# Patient Record
Sex: Male | Born: 1960 | Race: White | Hispanic: No | Marital: Single | Smoking: Current every day smoker
Health system: Southern US, Community
[De-identification: ages and names within clinical notes are randomized; demographics above are authoritative.]

## PROBLEM LIST (undated history)

## (undated) ENCOUNTER — Inpatient Hospital Stay: Payer: Self-pay | Admitting: Pulmonary Disease

## (undated) DIAGNOSIS — R569 Unspecified convulsions: Secondary | ICD-10-CM

## (undated) DIAGNOSIS — I472 Ventricular tachycardia, unspecified: Secondary | ICD-10-CM

## (undated) DIAGNOSIS — Z5189 Encounter for other specified aftercare: Secondary | ICD-10-CM

## (undated) DIAGNOSIS — F1911 Other psychoactive substance abuse, in remission: Secondary | ICD-10-CM

## (undated) DIAGNOSIS — J45909 Unspecified asthma, uncomplicated: Secondary | ICD-10-CM

## (undated) DIAGNOSIS — R634 Abnormal weight loss: Secondary | ICD-10-CM

## (undated) DIAGNOSIS — R51 Headache: Secondary | ICD-10-CM

## (undated) DIAGNOSIS — G43909 Migraine, unspecified, not intractable, without status migrainosus: Secondary | ICD-10-CM

## (undated) DIAGNOSIS — R011 Cardiac murmur, unspecified: Secondary | ICD-10-CM

## (undated) DIAGNOSIS — K922 Gastrointestinal hemorrhage, unspecified: Secondary | ICD-10-CM

## (undated) DIAGNOSIS — R0602 Shortness of breath: Secondary | ICD-10-CM

## (undated) DIAGNOSIS — IMO0001 Reserved for inherently not codable concepts without codable children: Secondary | ICD-10-CM

## (undated) DIAGNOSIS — F17210 Nicotine dependence, cigarettes, uncomplicated: Secondary | ICD-10-CM

## (undated) DIAGNOSIS — R197 Diarrhea, unspecified: Secondary | ICD-10-CM

## (undated) DIAGNOSIS — F32A Depression, unspecified: Secondary | ICD-10-CM

## (undated) DIAGNOSIS — K635 Polyp of colon: Secondary | ICD-10-CM

## (undated) DIAGNOSIS — F329 Major depressive disorder, single episode, unspecified: Secondary | ICD-10-CM

## (undated) DIAGNOSIS — B1920 Unspecified viral hepatitis C without hepatic coma: Secondary | ICD-10-CM

## (undated) DIAGNOSIS — N289 Disorder of kidney and ureter, unspecified: Secondary | ICD-10-CM

## (undated) DIAGNOSIS — Z992 Dependence on renal dialysis: Secondary | ICD-10-CM

## (undated) DIAGNOSIS — K5790 Diverticulosis of intestine, part unspecified, without perforation or abscess without bleeding: Secondary | ICD-10-CM

## (undated) DIAGNOSIS — Z8719 Personal history of other diseases of the digestive system: Secondary | ICD-10-CM

## (undated) DIAGNOSIS — J329 Chronic sinusitis, unspecified: Secondary | ICD-10-CM

## (undated) DIAGNOSIS — K219 Gastro-esophageal reflux disease without esophagitis: Secondary | ICD-10-CM

## (undated) DIAGNOSIS — E039 Hypothyroidism, unspecified: Secondary | ICD-10-CM

## (undated) DIAGNOSIS — E7800 Pure hypercholesterolemia, unspecified: Secondary | ICD-10-CM

## (undated) DIAGNOSIS — I4729 Other ventricular tachycardia: Secondary | ICD-10-CM

## (undated) DIAGNOSIS — I509 Heart failure, unspecified: Secondary | ICD-10-CM

## (undated) DIAGNOSIS — J449 Chronic obstructive pulmonary disease, unspecified: Secondary | ICD-10-CM

## (undated) DIAGNOSIS — N19 Unspecified kidney failure: Secondary | ICD-10-CM

## (undated) DIAGNOSIS — D649 Anemia, unspecified: Secondary | ICD-10-CM

## (undated) DIAGNOSIS — F419 Anxiety disorder, unspecified: Secondary | ICD-10-CM

## (undated) DIAGNOSIS — I10 Essential (primary) hypertension: Secondary | ICD-10-CM

## (undated) HISTORY — DX: Gastro-esophageal reflux disease without esophagitis: K21.9

## (undated) HISTORY — DX: Polyp of colon: K63.5

## (undated) HISTORY — DX: Other ventricular tachycardia: I47.29

## (undated) HISTORY — DX: Depression, unspecified: F32.A

## (undated) HISTORY — DX: Nicotine dependence, cigarettes, uncomplicated: F17.210

## (undated) HISTORY — DX: Unspecified porphyria: E80.20

## (undated) HISTORY — DX: Ventricular tachycardia, unspecified: I47.20

## (undated) HISTORY — DX: Anxiety disorder, unspecified: F41.9

## (undated) HISTORY — DX: Unspecified asthma, uncomplicated: J45.909

## (undated) HISTORY — DX: Headache: R51

## (undated) HISTORY — DX: Essential (primary) hypertension: I10

## (undated) HISTORY — DX: Hypothyroidism, unspecified: E03.9

## (undated) HISTORY — DX: Pure hypercholesterolemia, unspecified: E78.00

## (undated) HISTORY — PX: BRAIN SURGERY: SHX531

## (undated) HISTORY — DX: Diverticulosis of intestine, part unspecified, without perforation or abscess without bleeding: K57.90

## (undated) HISTORY — DX: Ventricular tachycardia: I47.2

## (undated) HISTORY — DX: Major depressive disorder, single episode, unspecified: F32.9

## (undated) HISTORY — DX: Abnormal weight loss: R63.4

## (undated) HISTORY — DX: Chronic sinusitis, unspecified: J32.9

## (undated) HISTORY — DX: Diarrhea, unspecified: R19.7

## (undated) HISTORY — PX: HIATAL HERNIA REPAIR: SHX195

## (undated) HISTORY — DX: Other psychoactive substance abuse, in remission: F19.11

## (undated) HISTORY — DX: Unspecified kidney failure: N19

## (undated) HISTORY — DX: Anemia, unspecified: D64.9

## (undated) HISTORY — DX: Unspecified viral hepatitis C without hepatic coma: B19.20

---
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
## 1997-11-02 HISTORY — PX: NISSEN FUNDOPLICATION: SHX2091

## 2005-12-08 ENCOUNTER — Ambulatory Visit: Payer: Self-pay | Admitting: Pulmonary Disease

## 2005-12-21 ENCOUNTER — Ambulatory Visit: Payer: Self-pay | Admitting: Pulmonary Disease

## 2005-12-22 ENCOUNTER — Ambulatory Visit: Payer: Self-pay | Admitting: Cardiovascular Disease

## 2006-02-01 ENCOUNTER — Ambulatory Visit: Payer: Self-pay | Admitting: Pulmonary Disease

## 2006-02-16 ENCOUNTER — Ambulatory Visit: Payer: Self-pay | Admitting: Gastroenterology

## 2006-02-17 ENCOUNTER — Ambulatory Visit: Payer: Self-pay | Admitting: Internal Medicine

## 2006-05-17 ENCOUNTER — Ambulatory Visit: Payer: Self-pay | Admitting: Pulmonary Disease

## 2006-09-13 ENCOUNTER — Ambulatory Visit: Payer: Self-pay | Admitting: Pulmonary Disease

## 2007-05-02 ENCOUNTER — Ambulatory Visit: Payer: Self-pay | Admitting: Pulmonary Disease

## 2007-05-02 LAB — CONVERTED CEMR LAB
ALT: 28 units/L (ref 0–53)
Albumin: 2.2 g/dL — ABNORMAL LOW (ref 3.5–5.2)
Alkaline Phosphatase: 98 units/L (ref 39–117)
BUN: 9 mg/dL (ref 6–23)
Basophils Absolute: 0.1 10*3/uL (ref 0.0–0.1)
Basophils Relative: 1 % (ref 0.0–1.0)
CO2: 27 meq/L (ref 19–32)
Calcium: 8.5 mg/dL (ref 8.4–10.5)
Creatinine, Ser: 1 mg/dL (ref 0.4–1.5)
MCHC: 35.1 g/dL (ref 30.0–36.0)
Monocytes Relative: 11.2 % — ABNORMAL HIGH (ref 3.0–11.0)
Platelets: 397 10*3/uL (ref 150–400)
Potassium: 4 meq/L (ref 3.5–5.1)
RBC: 4.24 M/uL (ref 4.22–5.81)
RDW: 12.6 % (ref 11.5–14.6)
TSH: 7.6 microintl units/mL — ABNORMAL HIGH (ref 0.35–5.50)
Total Bilirubin: 0.6 mg/dL (ref 0.3–1.2)
Total Protein: 5.2 g/dL — ABNORMAL LOW (ref 6.0–8.3)

## 2007-05-19 ENCOUNTER — Ambulatory Visit: Payer: Self-pay | Admitting: Gastroenterology

## 2007-05-19 LAB — CONVERTED CEMR LAB
Ammonia: 42 umol/L — ABNORMAL HIGH (ref 11–35)
HCV Quantitative: 9970000 intl units/mL — ABNORMAL HIGH (ref <5)
INR: 0.8 — ABNORMAL LOW (ref 0.9–2.0)
Iron: 217 ug/dL — ABNORMAL HIGH (ref 42–165)
Saturation Ratios: 54.1 % — ABNORMAL HIGH (ref 20.0–50.0)
Transferrin: 286.3 mg/dL (ref >=212.0)

## 2007-05-23 ENCOUNTER — Ambulatory Visit: Payer: Self-pay | Admitting: Gastroenterology

## 2007-05-30 ENCOUNTER — Ambulatory Visit: Payer: Self-pay | Admitting: Gastroenterology

## 2007-06-14 ENCOUNTER — Ambulatory Visit (HOSPITAL_COMMUNITY): Admission: RE | Admit: 2007-06-14 | Discharge: 2007-06-14 | Payer: Self-pay | Admitting: Gastroenterology

## 2007-06-14 ENCOUNTER — Encounter (INDEPENDENT_AMBULATORY_CARE_PROVIDER_SITE_OTHER): Payer: Self-pay | Admitting: Interventional Radiology

## 2007-06-30 ENCOUNTER — Ambulatory Visit: Payer: Self-pay | Admitting: Gastroenterology

## 2007-07-18 ENCOUNTER — Encounter: Payer: Self-pay | Admitting: Gastroenterology

## 2007-07-18 ENCOUNTER — Ambulatory Visit: Payer: Self-pay | Admitting: Gastroenterology

## 2007-07-18 DIAGNOSIS — D126 Benign neoplasm of colon, unspecified: Secondary | ICD-10-CM

## 2007-07-18 DIAGNOSIS — K5730 Diverticulosis of large intestine without perforation or abscess without bleeding: Secondary | ICD-10-CM | POA: Insufficient documentation

## 2007-09-06 ENCOUNTER — Ambulatory Visit: Payer: Self-pay | Admitting: Pulmonary Disease

## 2007-09-06 DIAGNOSIS — K219 Gastro-esophageal reflux disease without esophagitis: Secondary | ICD-10-CM

## 2007-09-06 DIAGNOSIS — J209 Acute bronchitis, unspecified: Secondary | ICD-10-CM | POA: Insufficient documentation

## 2007-09-06 DIAGNOSIS — F411 Generalized anxiety disorder: Secondary | ICD-10-CM | POA: Insufficient documentation

## 2007-09-12 ENCOUNTER — Ambulatory Visit: Payer: Self-pay | Admitting: Gastroenterology

## 2007-10-12 ENCOUNTER — Telehealth (INDEPENDENT_AMBULATORY_CARE_PROVIDER_SITE_OTHER): Payer: Self-pay | Admitting: *Deleted

## 2007-11-04 ENCOUNTER — Telehealth: Payer: Self-pay | Admitting: Pulmonary Disease

## 2007-11-10 ENCOUNTER — Telehealth (INDEPENDENT_AMBULATORY_CARE_PROVIDER_SITE_OTHER): Payer: Self-pay | Admitting: *Deleted

## 2007-11-11 ENCOUNTER — Ambulatory Visit: Payer: Self-pay | Admitting: Pulmonary Disease

## 2007-11-29 ENCOUNTER — Telehealth (INDEPENDENT_AMBULATORY_CARE_PROVIDER_SITE_OTHER): Payer: Self-pay | Admitting: *Deleted

## 2007-12-13 ENCOUNTER — Ambulatory Visit: Payer: Self-pay | Admitting: Gastroenterology

## 2007-12-15 ENCOUNTER — Ambulatory Visit: Payer: Self-pay | Admitting: Pulmonary Disease

## 2007-12-15 DIAGNOSIS — R569 Unspecified convulsions: Secondary | ICD-10-CM

## 2007-12-15 DIAGNOSIS — F17200 Nicotine dependence, unspecified, uncomplicated: Secondary | ICD-10-CM

## 2007-12-15 DIAGNOSIS — B1710 Acute hepatitis C without hepatic coma: Secondary | ICD-10-CM | POA: Insufficient documentation

## 2007-12-17 DIAGNOSIS — R51 Headache: Secondary | ICD-10-CM | POA: Insufficient documentation

## 2007-12-17 DIAGNOSIS — R519 Headache, unspecified: Secondary | ICD-10-CM | POA: Insufficient documentation

## 2007-12-17 DIAGNOSIS — I10 Essential (primary) hypertension: Secondary | ICD-10-CM | POA: Insufficient documentation

## 2007-12-21 ENCOUNTER — Encounter: Payer: Self-pay | Admitting: Pulmonary Disease

## 2008-01-12 ENCOUNTER — Telehealth (INDEPENDENT_AMBULATORY_CARE_PROVIDER_SITE_OTHER): Payer: Self-pay | Admitting: *Deleted

## 2008-01-16 ENCOUNTER — Ambulatory Visit: Payer: Self-pay | Admitting: Pulmonary Disease

## 2008-01-27 DIAGNOSIS — Z9189 Other specified personal risk factors, not elsewhere classified: Secondary | ICD-10-CM | POA: Insufficient documentation

## 2008-02-23 ENCOUNTER — Encounter: Payer: Self-pay | Admitting: Pulmonary Disease

## 2008-03-07 IMAGING — US US ABDOMEN COMPLETE
1 series · 13 of 25 positions shown · non-contrast
Comparison: none

[Series 1: us abdomen complete · 0.31mm/px · 13 of 47 slices shown]
[im 1/47]
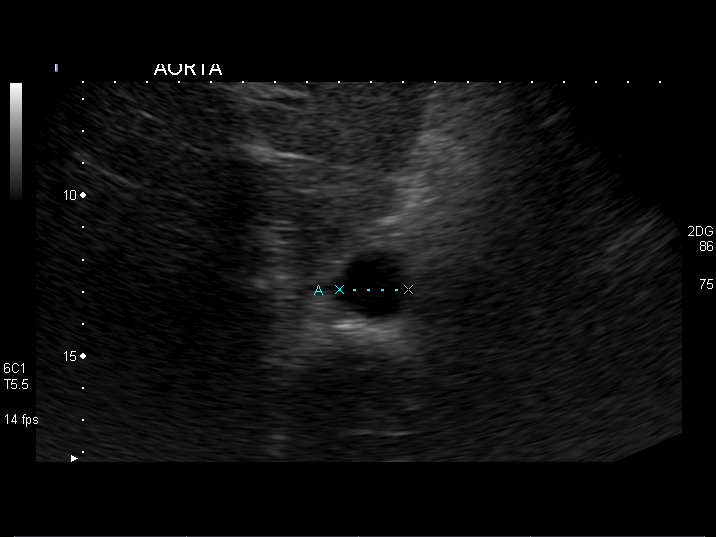
[im 4/47]
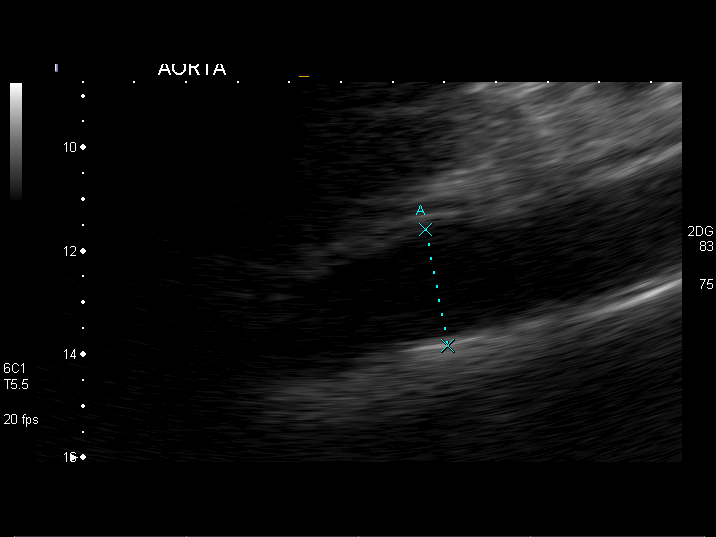
[im 8/47]
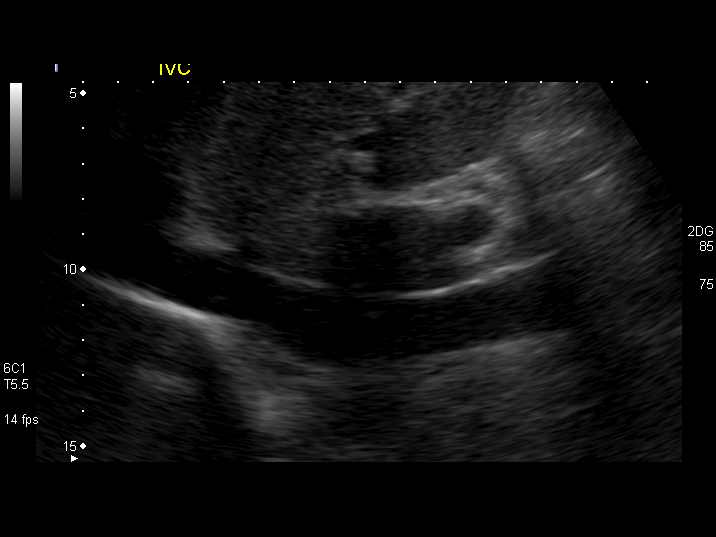
[im 12/47]
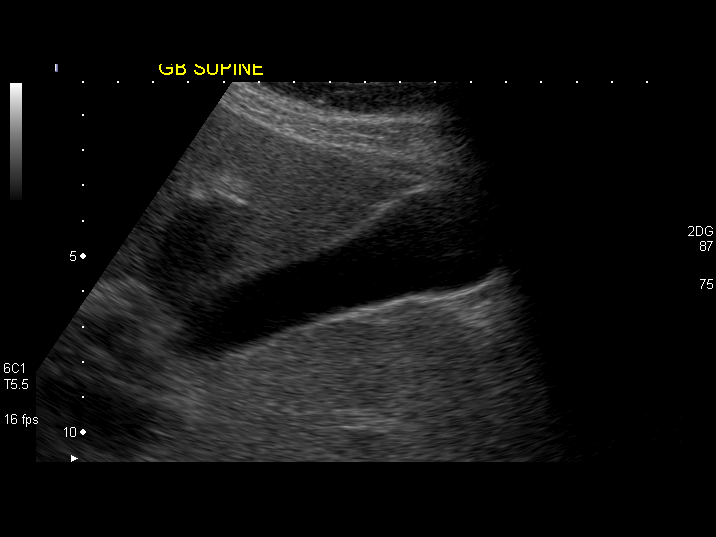
[im 16/47]
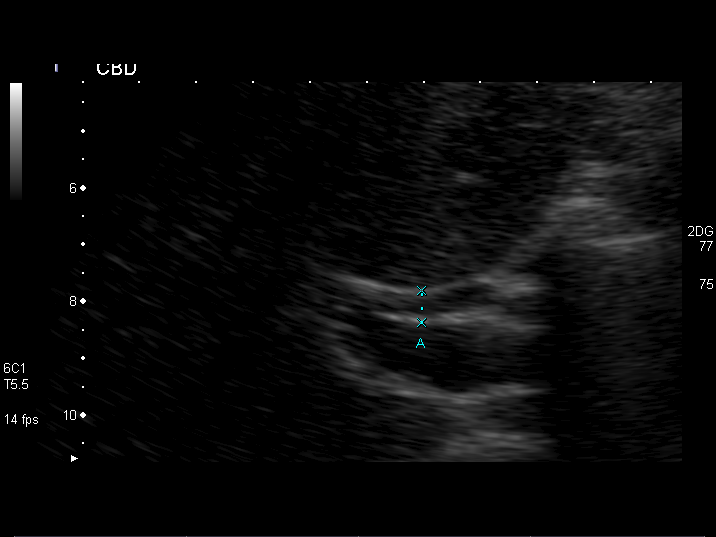
[im 20/47]
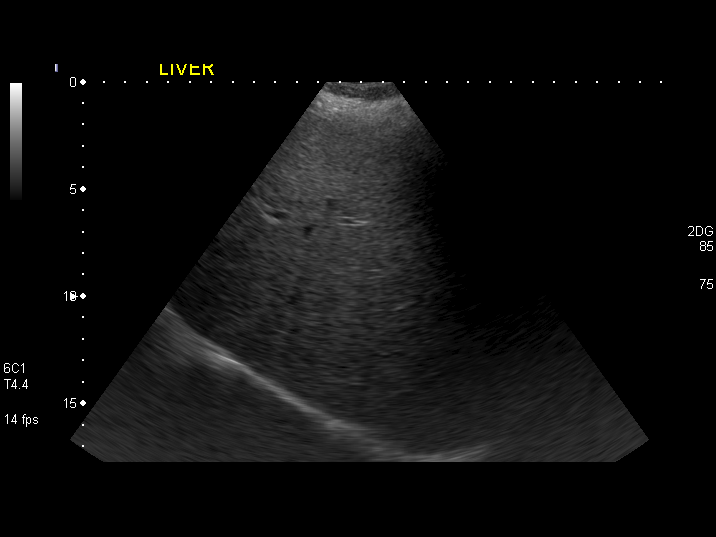
[im 24/47]
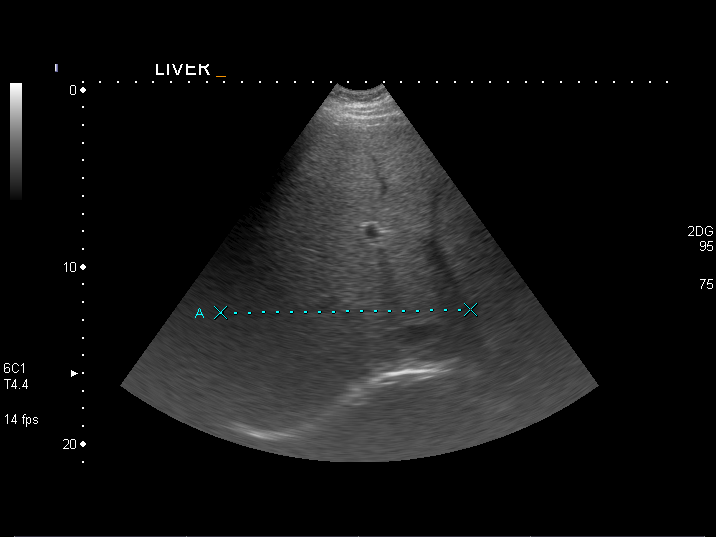
[im 27/47]
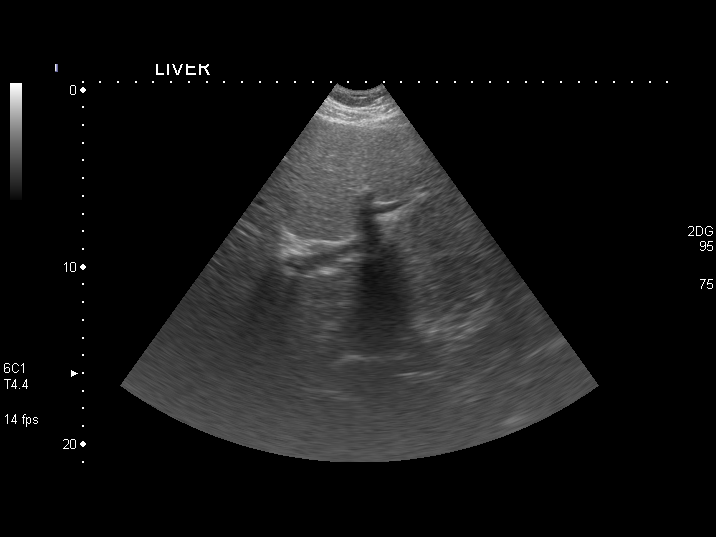
[im 31/47]
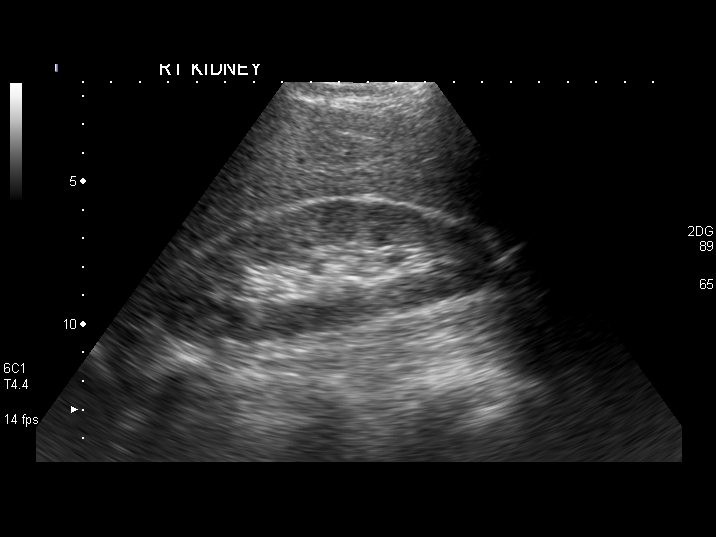
[im 35/47]
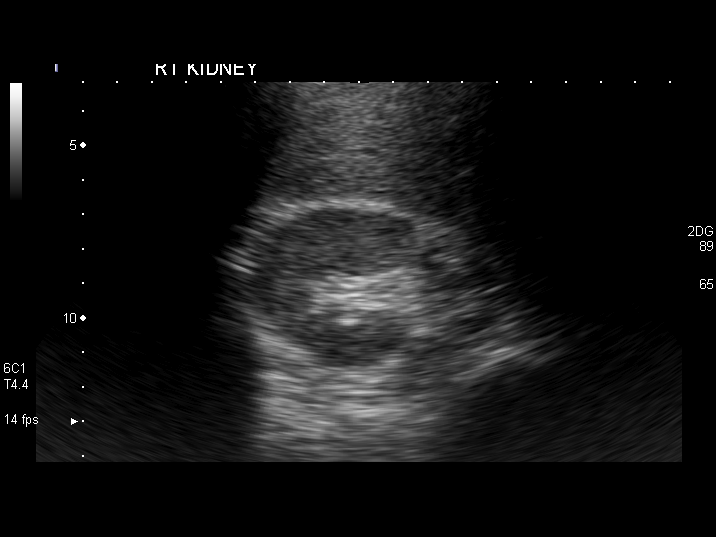
[im 39/47]
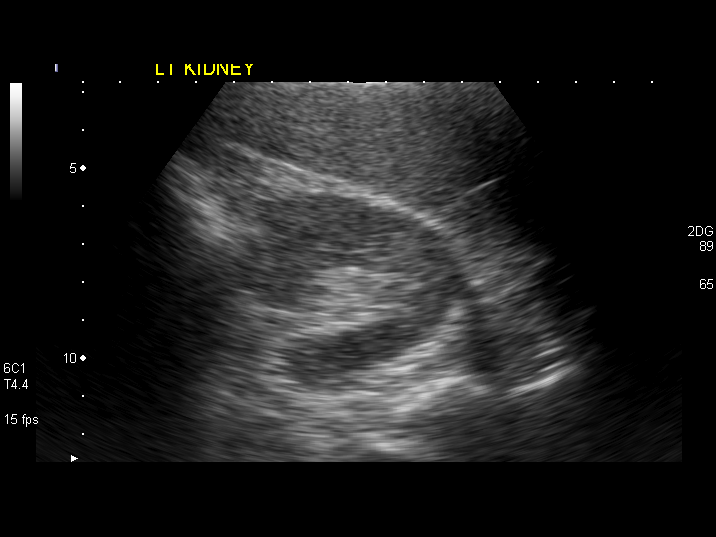
[im 43/47]
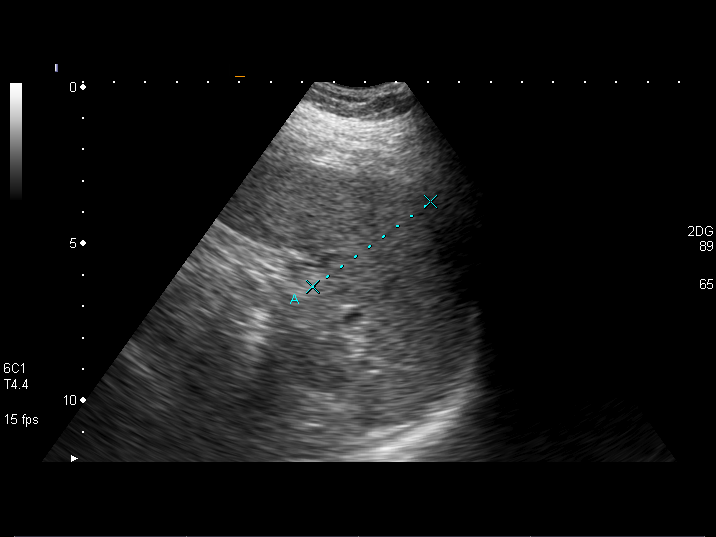
[im 47/47]
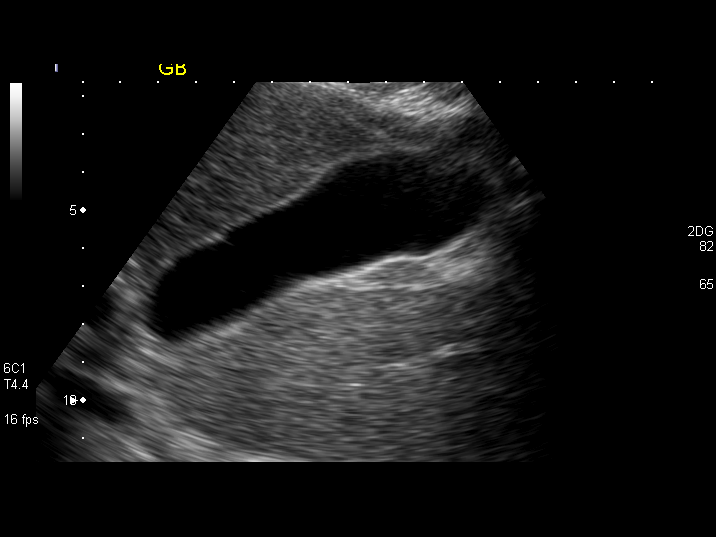

[13 of 25 positions shown; findings below may reference images not displayed]

ACCESSION #:  [id number]

 READING PHYSICIAN:  De Schepper, Mariadosanjos

 PROCEDURE:  Multiplanar abdominal ultrasound imaging was performed in the upright, supine, right and left lateral decubitus positions.

 RESULTS:  Abdominal aorta normal.   Diameter 2.3 cm.  The IVC is patent. 

 The pancreas appears normal throughout the head, body and tail without evidence of ductal dilatation, pancreatic masses, or peripancreatic inflammation.  

 Gallbladder is well distended, thin walled, with no pericholecystic fluid or intraluminal echogenic foci to suggest gallstone disease.  Wall thickness 2.7 mm.  

 The common bile duct measures 5.6 mm in maximal diameter without evidence of intraluminal foci. 

 There is mild increased liver echodensity without focal hepatic mass, lesions, or dilated biliary structures.  There is mild increased echo density; and, also, the liver appears enlarged. 

 Kidneys are normal in appearance; right 11.7 and left 11.4 cm.  

 Spleen is normal in size, measuring 10.6 cm without parenchymal lesion.

 ASSESSMENT:  This is a normal upper abdominal ultrasound exam except for an enlarged echo dense liver consistent with fatty infiltration of the liver.  Gallbladder appears normal without cholelithiasis and the pancreas is well imaged and appears normal.

## 2008-03-15 ENCOUNTER — Ambulatory Visit: Payer: Self-pay | Admitting: Gastroenterology

## 2008-03-29 IMAGING — US US BIOPSY
1 series · 9 of 9 positions shown · non-contrast
Comparison: none

CLINICAL DATA: Hepatitis C; request is made for random core liver biopsy.
ULTRASOUND-GUIDED RANDOM CORE LIVER BIOPSY:

[Series 1: biopsy · 0.46mm/px · 9 of 9 slices shown]
[im 1/9]
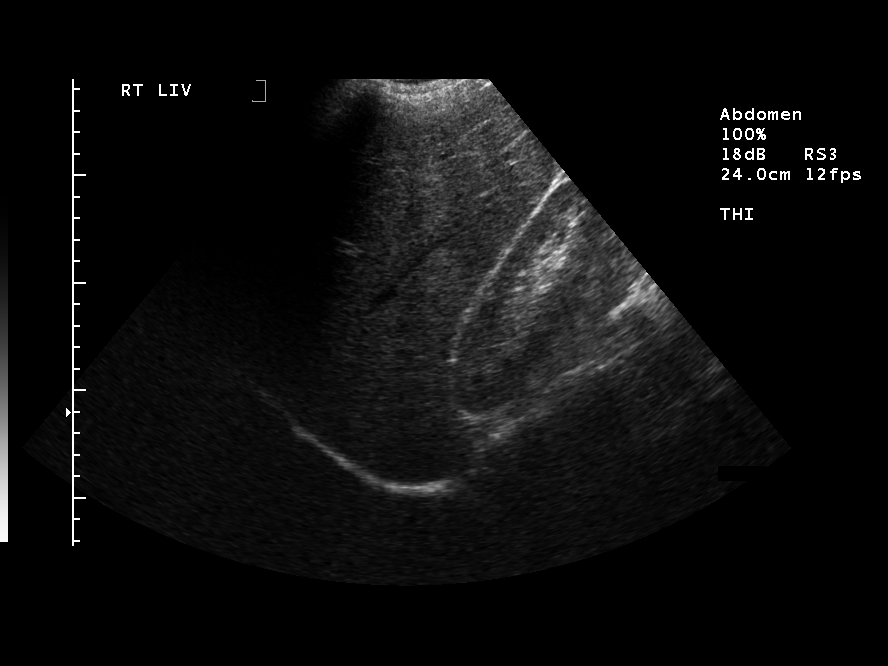
[im 2/9]
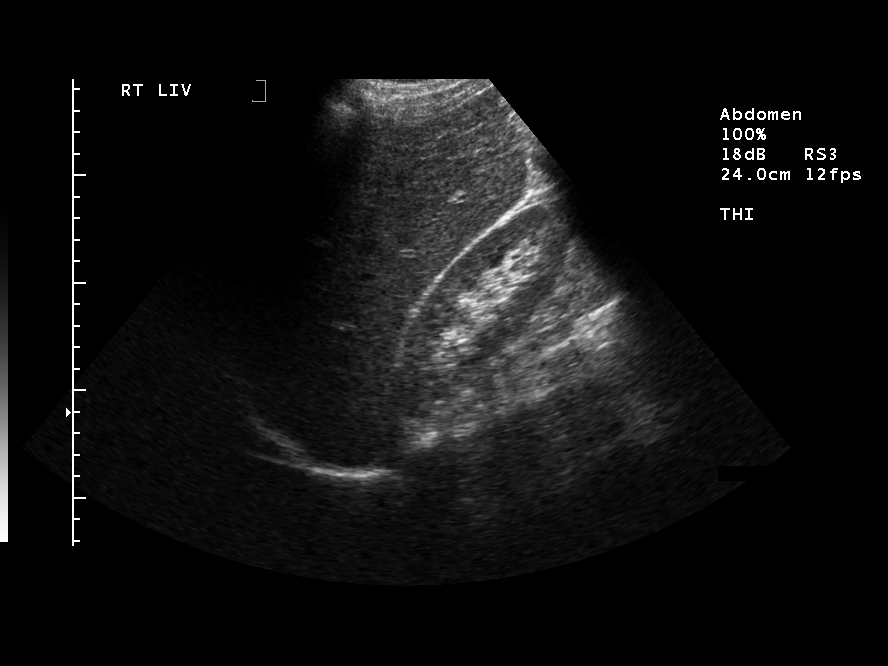
[im 3/9]
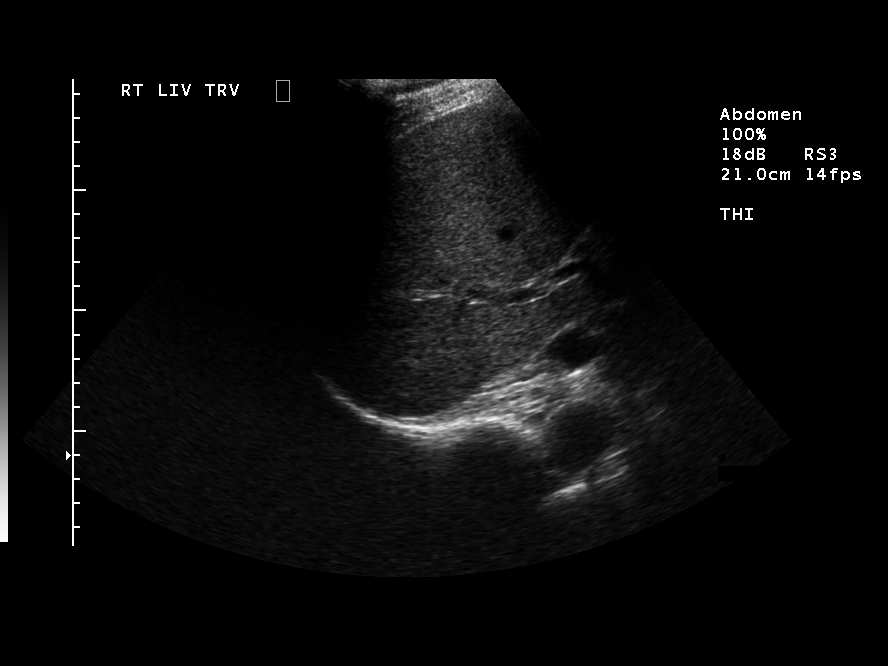
[im 4/9]
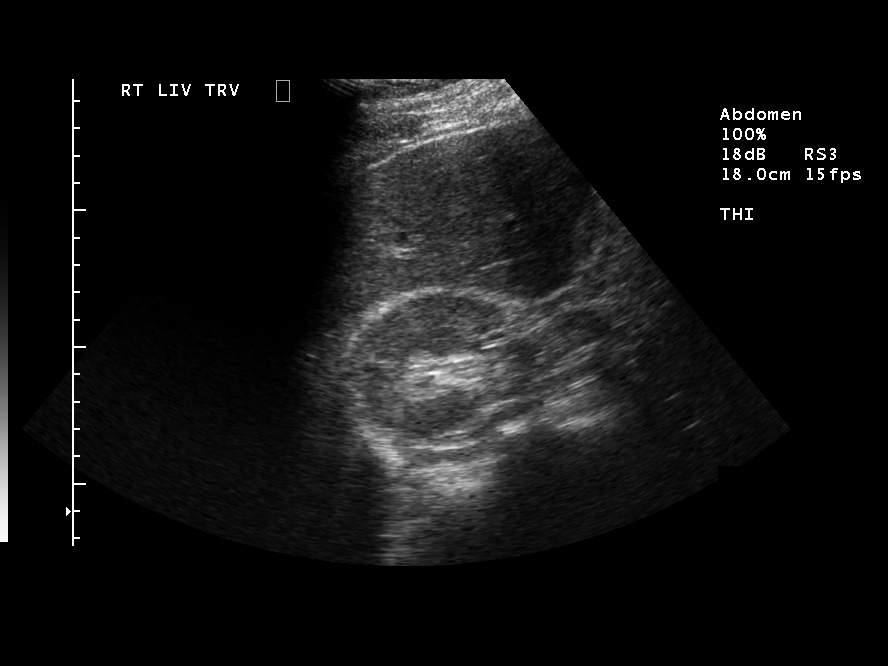
[im 5/9]
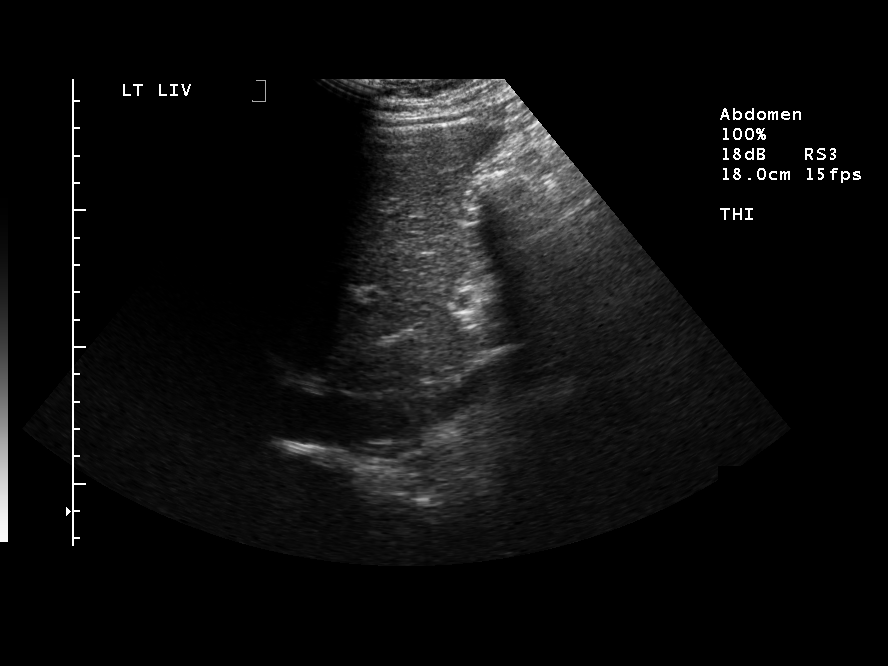
[im 6/9]
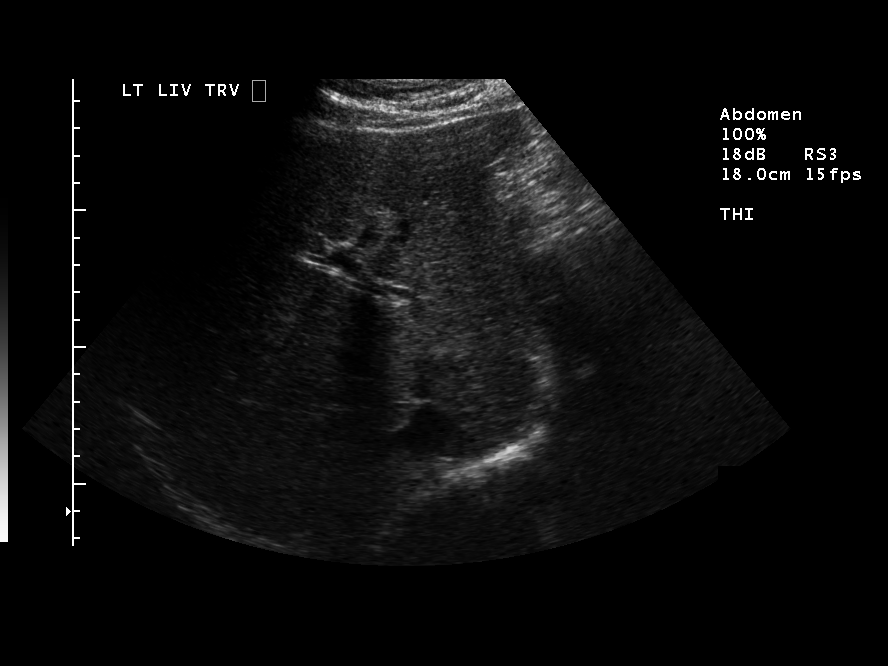
[im 7/9]
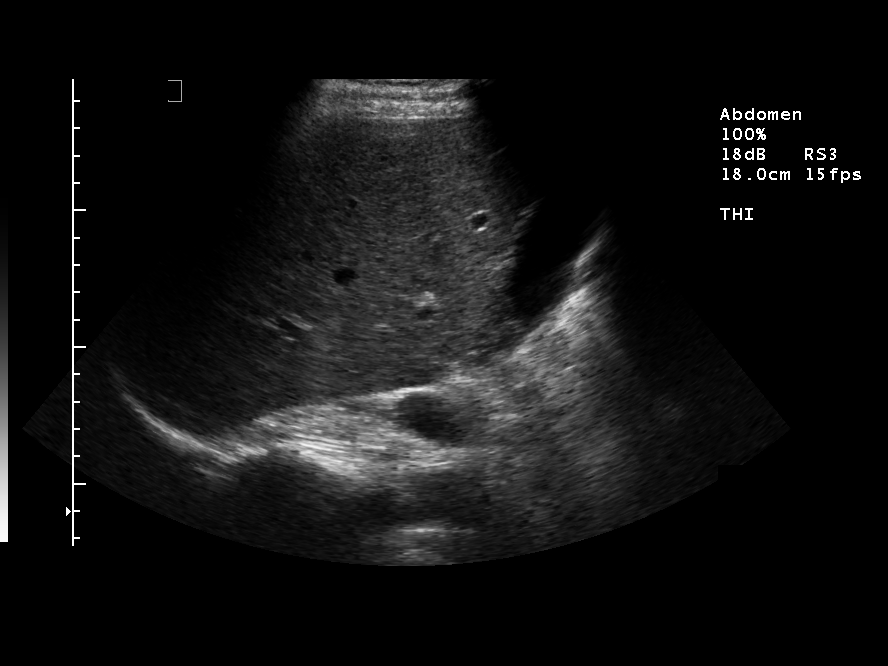
[im 8/9]
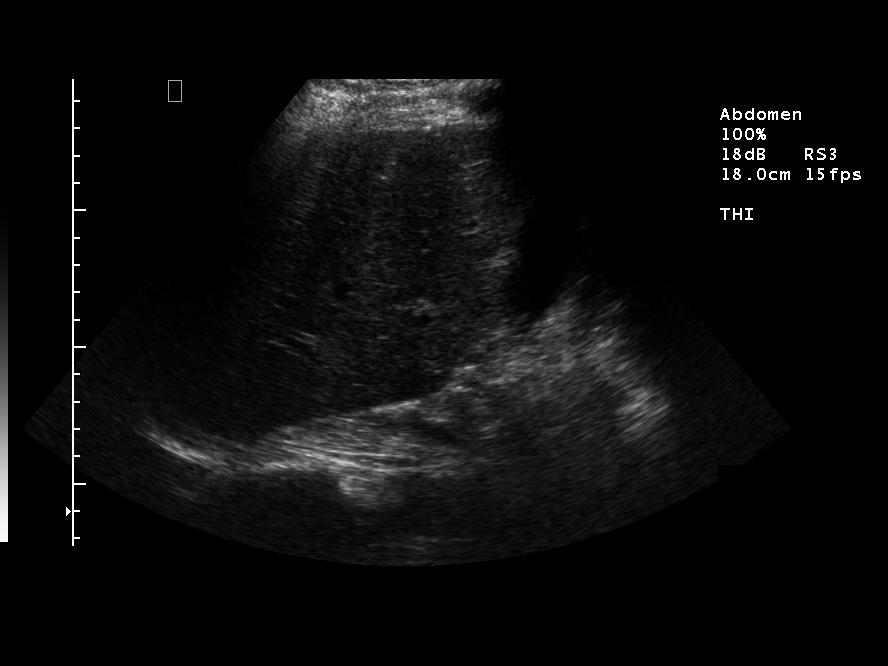
[im 9/9]
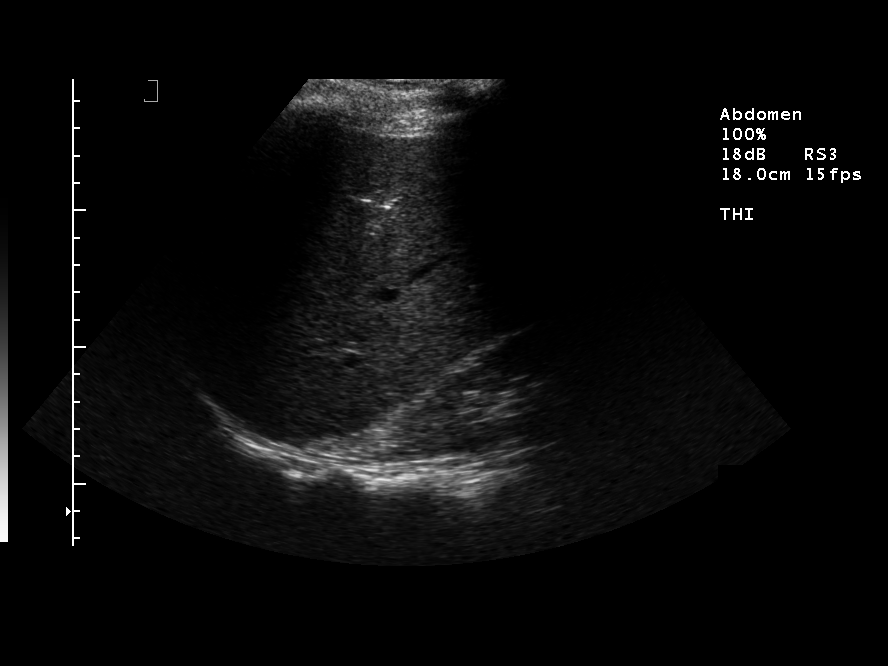

[9 of 9 positions shown; findings below may reference images not displayed]

FINDINGS: An ultrasound-guided random core liver biopsy was thoroughly discussed with the patient and questions were answered.  The benefits, risks, alternatives, and complications were also discussed.  The patient understands and wishes to proceed with the procedure.  Verbal as well as written consent was obtained.
Under ultrasound guidance, an appropriate skin site was marked.  The patient was then prepped and draped in the normal sterile fashion.  1% lidocaine was used for local anesthesia.  Through a 17-gauge guiding trocar, 3 passes were then made into the right hepatic lobe with an 18-gauge biopsy gun.  Ultrasound imaging confirmed appropriate needle placement in the liver parenchyma.  Specimens were sent to pathology for further evaluation.  The patient tolerated the procedure well and there were no immediate complications.  
Medications utilized:  Versed 4 mg IV, fentanyl 125 mcg IV.  Cardiorespiratory monitoring was performed by the interventional radiology nurse during the procedure.  The patient?s vital signs remained stable throughout the procedure, and he will be observed in the [REDACTED] for an additional 3 hours post-biopsy and then discharged home afterwards if stable.
Total Time of Sedation:  20 minutes.
Procedure was performed under the supervision of Dr. Abimelk Tiger.
IMPRESSION: Successful ultrasound-guided random core liver biopsy of the right hepatic lobe as discussed above.

## 2008-04-19 ENCOUNTER — Ambulatory Visit: Payer: Self-pay | Admitting: Gastroenterology

## 2008-05-25 ENCOUNTER — Encounter: Payer: Self-pay | Admitting: Pulmonary Disease

## 2008-06-08 ENCOUNTER — Encounter (INDEPENDENT_AMBULATORY_CARE_PROVIDER_SITE_OTHER): Payer: Self-pay | Admitting: Nephrology

## 2008-06-08 ENCOUNTER — Encounter: Payer: Self-pay | Admitting: Pulmonary Disease

## 2008-06-08 ENCOUNTER — Ambulatory Visit (HOSPITAL_COMMUNITY): Admission: RE | Admit: 2008-06-08 | Discharge: 2008-06-09 | Payer: Self-pay | Admitting: Nephrology

## 2008-06-26 ENCOUNTER — Telehealth: Payer: Self-pay | Admitting: Pulmonary Disease

## 2008-07-05 ENCOUNTER — Ambulatory Visit: Payer: Self-pay | Admitting: Gastroenterology

## 2008-07-16 ENCOUNTER — Encounter: Payer: Self-pay | Admitting: Pulmonary Disease

## 2008-07-25 ENCOUNTER — Ambulatory Visit: Payer: Self-pay | Admitting: Pulmonary Disease

## 2008-07-25 DIAGNOSIS — J329 Chronic sinusitis, unspecified: Secondary | ICD-10-CM | POA: Insufficient documentation

## 2008-08-02 ENCOUNTER — Ambulatory Visit (HOSPITAL_COMMUNITY): Admission: RE | Admit: 2008-08-02 | Discharge: 2008-08-02 | Payer: Self-pay | Admitting: Gastroenterology

## 2008-11-20 ENCOUNTER — Ambulatory Visit: Payer: Self-pay | Admitting: Gastroenterology

## 2008-11-20 ENCOUNTER — Encounter: Payer: Self-pay | Admitting: Pulmonary Disease

## 2008-11-22 ENCOUNTER — Telehealth: Payer: Self-pay | Admitting: Pulmonary Disease

## 2008-11-26 ENCOUNTER — Ambulatory Visit: Payer: Self-pay | Admitting: Pulmonary Disease

## 2008-11-29 ENCOUNTER — Ambulatory Visit: Payer: Self-pay | Admitting: Gastroenterology

## 2008-11-29 ENCOUNTER — Encounter: Payer: Self-pay | Admitting: Pulmonary Disease

## 2008-12-05 ENCOUNTER — Encounter: Payer: Self-pay | Admitting: Pulmonary Disease

## 2008-12-10 LAB — CONVERTED CEMR LAB
T3, Free: 2.2 pg/mL — ABNORMAL LOW (ref 2.3–4.2)
TSH: 4.19 microintl units/mL (ref 0.35–5.50)

## 2008-12-11 ENCOUNTER — Encounter: Payer: Self-pay | Admitting: Pulmonary Disease

## 2008-12-11 ENCOUNTER — Ambulatory Visit: Payer: Self-pay | Admitting: Gastroenterology

## 2008-12-25 ENCOUNTER — Ambulatory Visit: Payer: Self-pay | Admitting: Gastroenterology

## 2009-01-08 ENCOUNTER — Ambulatory Visit: Payer: Self-pay | Admitting: Gastroenterology

## 2009-01-08 ENCOUNTER — Encounter: Payer: Self-pay | Admitting: Pulmonary Disease

## 2009-01-15 ENCOUNTER — Ambulatory Visit: Payer: Self-pay | Admitting: Gastroenterology

## 2009-01-17 ENCOUNTER — Telehealth: Payer: Self-pay | Admitting: Pulmonary Disease

## 2009-01-29 ENCOUNTER — Ambulatory Visit: Payer: Self-pay | Admitting: Pulmonary Disease

## 2009-01-29 ENCOUNTER — Ambulatory Visit: Payer: Self-pay | Admitting: Gastroenterology

## 2009-01-29 DIAGNOSIS — F329 Major depressive disorder, single episode, unspecified: Secondary | ICD-10-CM | POA: Insufficient documentation

## 2009-01-29 DIAGNOSIS — E039 Hypothyroidism, unspecified: Secondary | ICD-10-CM | POA: Insufficient documentation

## 2009-02-12 ENCOUNTER — Ambulatory Visit: Payer: Self-pay | Admitting: Gastroenterology

## 2009-02-12 ENCOUNTER — Encounter: Payer: Self-pay | Admitting: Pulmonary Disease

## 2009-02-19 ENCOUNTER — Encounter: Payer: Self-pay | Admitting: Pulmonary Disease

## 2009-02-26 ENCOUNTER — Ambulatory Visit: Payer: Self-pay | Admitting: Gastroenterology

## 2009-03-12 ENCOUNTER — Encounter: Payer: Self-pay | Admitting: Pulmonary Disease

## 2009-03-12 ENCOUNTER — Ambulatory Visit: Payer: Self-pay | Admitting: Gastroenterology

## 2009-03-14 ENCOUNTER — Encounter: Payer: Self-pay | Admitting: Pulmonary Disease

## 2009-03-21 ENCOUNTER — Ambulatory Visit: Payer: Self-pay | Admitting: Gastroenterology

## 2009-03-21 ENCOUNTER — Encounter: Payer: Self-pay | Admitting: Pulmonary Disease

## 2009-04-09 ENCOUNTER — Ambulatory Visit: Payer: Self-pay | Admitting: Gastroenterology

## 2009-04-15 ENCOUNTER — Ambulatory Visit: Payer: Self-pay | Admitting: Pulmonary Disease

## 2009-04-20 DIAGNOSIS — E7800 Pure hypercholesterolemia, unspecified: Secondary | ICD-10-CM

## 2009-05-07 ENCOUNTER — Encounter: Payer: Self-pay | Admitting: Pulmonary Disease

## 2009-05-07 ENCOUNTER — Ambulatory Visit: Payer: Self-pay | Admitting: Gastroenterology

## 2009-05-21 ENCOUNTER — Encounter: Payer: Self-pay | Admitting: Pulmonary Disease

## 2009-06-28 ENCOUNTER — Telehealth: Payer: Self-pay | Admitting: Pulmonary Disease

## 2009-08-28 ENCOUNTER — Telehealth: Payer: Self-pay | Admitting: Pulmonary Disease

## 2009-10-01 ENCOUNTER — Telehealth: Payer: Self-pay | Admitting: Pulmonary Disease

## 2009-10-01 ENCOUNTER — Ambulatory Visit: Payer: Self-pay | Admitting: Pulmonary Disease

## 2009-10-01 ENCOUNTER — Encounter: Payer: Self-pay | Admitting: Adult Health

## 2009-10-01 DIAGNOSIS — R634 Abnormal weight loss: Secondary | ICD-10-CM | POA: Insufficient documentation

## 2009-10-02 LAB — CONVERTED CEMR LAB
ALT: 21 units/L (ref 0–53)
AST: 21 units/L (ref 0–37)
Albumin: 2.8 g/dL — ABNORMAL LOW (ref 3.5–5.2)
Basophils Relative: 0 % (ref 0.0–3.0)
Chloride: 110 meq/L (ref 96–112)
Eosinophils Relative: 1.6 % (ref 0.0–5.0)
GFR calc non Af Amer: 19.3 mL/min (ref >60)
HCT: 33.2 % — ABNORMAL LOW (ref 39.0–52.0)
Hemoglobin: 11.4 g/dL — ABNORMAL LOW (ref 13.0–17.0)
Lymphs Abs: 3.7 10*3/uL (ref 0.7–4.0)
MCV: 100.6 fL — ABNORMAL HIGH (ref 78.0–100.0)
Monocytes Absolute: 1.2 10*3/uL — ABNORMAL HIGH (ref 0.1–1.0)
Monocytes Relative: 11.2 % (ref 3.0–12.0)
Neutro Abs: 5.6 10*3/uL (ref 1.4–7.7)
Potassium: 4.1 meq/L (ref 3.5–5.1)
Sodium: 142 meq/L (ref 135–145)
Total Bilirubin: 0.5 mg/dL (ref 0.3–1.2)
Total Protein: 5.7 g/dL — ABNORMAL LOW (ref 6.0–8.3)
WBC: 10.7 10*3/uL — ABNORMAL HIGH (ref 4.5–10.5)

## 2009-10-03 ENCOUNTER — Encounter: Payer: Self-pay | Admitting: Pulmonary Disease

## 2009-10-29 ENCOUNTER — Ambulatory Visit: Payer: Self-pay | Admitting: Pulmonary Disease

## 2009-12-05 ENCOUNTER — Emergency Department (HOSPITAL_COMMUNITY): Admission: EM | Admit: 2009-12-05 | Discharge: 2009-12-05 | Payer: Self-pay | Admitting: Emergency Medicine

## 2009-12-06 ENCOUNTER — Encounter: Payer: Self-pay | Admitting: Pulmonary Disease

## 2010-01-10 ENCOUNTER — Telehealth (INDEPENDENT_AMBULATORY_CARE_PROVIDER_SITE_OTHER): Payer: Self-pay | Admitting: *Deleted

## 2010-02-17 ENCOUNTER — Ambulatory Visit: Payer: Self-pay | Admitting: Surgery

## 2010-02-25 ENCOUNTER — Inpatient Hospital Stay (HOSPITAL_COMMUNITY): Admission: EM | Admit: 2010-02-25 | Discharge: 2010-03-05 | Payer: Self-pay | Admitting: Emergency Medicine

## 2010-02-25 ENCOUNTER — Other Ambulatory Visit: Payer: Self-pay | Admitting: Surgery

## 2010-02-27 ENCOUNTER — Ambulatory Visit: Payer: Self-pay | Admitting: Gastroenterology

## 2010-02-27 ENCOUNTER — Encounter: Payer: Self-pay | Admitting: Pulmonary Disease

## 2010-02-27 ENCOUNTER — Encounter (INDEPENDENT_AMBULATORY_CARE_PROVIDER_SITE_OTHER): Payer: Self-pay | Admitting: Internal Medicine

## 2010-03-01 ENCOUNTER — Encounter (INDEPENDENT_AMBULATORY_CARE_PROVIDER_SITE_OTHER): Payer: Self-pay | Admitting: Internal Medicine

## 2010-03-01 ENCOUNTER — Encounter: Payer: Self-pay | Admitting: Internal Medicine

## 2010-03-01 ENCOUNTER — Encounter: Payer: Self-pay | Admitting: Pulmonary Disease

## 2010-03-02 ENCOUNTER — Ambulatory Visit: Payer: Self-pay | Admitting: Vascular Surgery

## 2010-03-02 HISTORY — PX: AV FISTULA PLACEMENT: SHX1204

## 2010-03-03 ENCOUNTER — Encounter: Payer: Self-pay | Admitting: Vascular Surgery

## 2010-03-03 ENCOUNTER — Encounter: Payer: Self-pay | Admitting: Gastroenterology

## 2010-03-04 ENCOUNTER — Encounter: Payer: Self-pay | Admitting: Internal Medicine

## 2010-03-06 ENCOUNTER — Telehealth: Payer: Self-pay | Admitting: Pulmonary Disease

## 2010-03-12 ENCOUNTER — Ambulatory Visit: Payer: Self-pay | Admitting: Pulmonary Disease

## 2010-03-12 DIAGNOSIS — N186 End stage renal disease: Secondary | ICD-10-CM | POA: Insufficient documentation

## 2010-03-12 DIAGNOSIS — D631 Anemia in chronic kidney disease: Secondary | ICD-10-CM | POA: Insufficient documentation

## 2010-03-12 DIAGNOSIS — N189 Chronic kidney disease, unspecified: Secondary | ICD-10-CM

## 2010-03-24 ENCOUNTER — Encounter: Payer: Self-pay | Admitting: Pulmonary Disease

## 2010-03-28 ENCOUNTER — Telehealth (INDEPENDENT_AMBULATORY_CARE_PROVIDER_SITE_OTHER): Payer: Self-pay | Admitting: *Deleted

## 2010-04-02 ENCOUNTER — Inpatient Hospital Stay (HOSPITAL_COMMUNITY): Admission: RE | Admit: 2010-04-02 | Discharge: 2010-04-04 | Payer: Self-pay | Admitting: General Surgery

## 2010-04-25 ENCOUNTER — Telehealth: Payer: Self-pay | Admitting: Pulmonary Disease

## 2010-05-01 ENCOUNTER — Ambulatory Visit: Payer: Self-pay | Admitting: Pulmonary Disease

## 2010-05-03 DIAGNOSIS — R197 Diarrhea, unspecified: Secondary | ICD-10-CM

## 2010-06-02 ENCOUNTER — Emergency Department (HOSPITAL_COMMUNITY): Admission: EM | Admit: 2010-06-02 | Discharge: 2010-06-02 | Payer: Self-pay | Admitting: Emergency Medicine

## 2010-06-05 ENCOUNTER — Telehealth: Payer: Self-pay | Admitting: Pulmonary Disease

## 2010-07-04 ENCOUNTER — Ambulatory Visit: Payer: Self-pay | Admitting: Internal Medicine

## 2010-07-04 ENCOUNTER — Inpatient Hospital Stay (HOSPITAL_COMMUNITY): Admission: EM | Admit: 2010-07-04 | Discharge: 2010-07-05 | Payer: Self-pay | Admitting: Pulmonary Disease

## 2010-07-04 ENCOUNTER — Encounter (INDEPENDENT_AMBULATORY_CARE_PROVIDER_SITE_OTHER): Payer: Self-pay | Admitting: Nephrology

## 2010-07-09 ENCOUNTER — Telehealth (INDEPENDENT_AMBULATORY_CARE_PROVIDER_SITE_OTHER): Payer: Self-pay | Admitting: *Deleted

## 2010-07-25 ENCOUNTER — Telehealth: Payer: Self-pay | Admitting: Pulmonary Disease

## 2010-09-01 ENCOUNTER — Telehealth (INDEPENDENT_AMBULATORY_CARE_PROVIDER_SITE_OTHER): Payer: Self-pay | Admitting: *Deleted

## 2010-10-29 ENCOUNTER — Ambulatory Visit: Payer: Self-pay | Admitting: Pulmonary Disease

## 2010-10-31 ENCOUNTER — Telehealth: Payer: Self-pay | Admitting: Pulmonary Disease

## 2010-11-07 ENCOUNTER — Ambulatory Visit: Admit: 2010-11-07 | Payer: Self-pay | Admitting: Pulmonary Disease

## 2010-11-27 ENCOUNTER — Inpatient Hospital Stay (HOSPITAL_COMMUNITY)
Admission: AD | Admit: 2010-11-27 | Discharge: 2010-12-03 | DRG: 286 | Disposition: A | Discharge status: Home / Self Care | Payer: Medicare Other | Source: Other Acute Inpatient Hospital | Attending: Internal Medicine | Admitting: Internal Medicine

## 2010-11-27 ENCOUNTER — Encounter: Payer: Self-pay | Admitting: Ophthalmology

## 2010-11-27 ENCOUNTER — Encounter: Payer: Self-pay | Admitting: Internal Medicine

## 2010-11-27 DIAGNOSIS — N186 End stage renal disease: Secondary | ICD-10-CM | POA: Diagnosis present

## 2010-11-27 DIAGNOSIS — F329 Major depressive disorder, single episode, unspecified: Secondary | ICD-10-CM | POA: Diagnosis present

## 2010-11-27 DIAGNOSIS — I472 Ventricular tachycardia, unspecified: Principal | ICD-10-CM | POA: Diagnosis present

## 2010-11-27 DIAGNOSIS — G8929 Other chronic pain: Secondary | ICD-10-CM | POA: Diagnosis present

## 2010-11-27 DIAGNOSIS — K089 Disorder of teeth and supporting structures, unspecified: Secondary | ICD-10-CM | POA: Diagnosis present

## 2010-11-27 DIAGNOSIS — I120 Hypertensive chronic kidney disease with stage 5 chronic kidney disease or end stage renal disease: Secondary | ICD-10-CM | POA: Diagnosis present

## 2010-11-27 DIAGNOSIS — E039 Hypothyroidism, unspecified: Secondary | ICD-10-CM | POA: Diagnosis present

## 2010-11-27 DIAGNOSIS — K219 Gastro-esophageal reflux disease without esophagitis: Secondary | ICD-10-CM | POA: Diagnosis present

## 2010-11-27 DIAGNOSIS — F3289 Other specified depressive episodes: Secondary | ICD-10-CM | POA: Diagnosis present

## 2010-11-27 DIAGNOSIS — G40802 Other epilepsy, not intractable, without status epilepticus: Secondary | ICD-10-CM | POA: Diagnosis present

## 2010-11-27 DIAGNOSIS — I4729 Other ventricular tachycardia: Principal | ICD-10-CM | POA: Diagnosis present

## 2010-11-27 DIAGNOSIS — B1920 Unspecified viral hepatitis C without hepatic coma: Secondary | ICD-10-CM | POA: Diagnosis present

## 2010-11-27 DIAGNOSIS — K047 Periapical abscess without sinus: Secondary | ICD-10-CM | POA: Diagnosis present

## 2010-11-27 DIAGNOSIS — F17200 Nicotine dependence, unspecified, uncomplicated: Secondary | ICD-10-CM | POA: Diagnosis present

## 2010-11-27 DIAGNOSIS — D649 Anemia, unspecified: Secondary | ICD-10-CM | POA: Diagnosis present

## 2010-11-27 DIAGNOSIS — I2510 Atherosclerotic heart disease of native coronary artery without angina pectoris: Secondary | ICD-10-CM | POA: Diagnosis present

## 2010-11-27 LAB — CBC
Hemoglobin: 10.5 g/dL — ABNORMAL LOW (ref 13.0–17.0)
RBC: 3.03 MIL/uL — ABNORMAL LOW (ref 4.22–5.81)
WBC: 8.4 10*3/uL (ref 4.0–10.5)

## 2010-11-27 LAB — BASIC METABOLIC PANEL
BUN: 56 mg/dL — ABNORMAL HIGH (ref 6–23)
Calcium: 8.6 mg/dL (ref 8.4–10.5)
GFR calc non Af Amer: 8 mL/min — ABNORMAL LOW (ref >60)
Potassium: 4.4 mEq/L (ref 3.5–5.1)
Sodium: 139 mEq/L (ref 135–145)

## 2010-11-27 LAB — COMPREHENSIVE METABOLIC PANEL
ALT: 18 U/L (ref 0–53)
AST: 21 U/L (ref 0–37)
CO2: 25 mEq/L (ref 19–32)
Chloride: 96 mEq/L (ref 96–112)
GFR calc Af Amer: 9 mL/min — ABNORMAL LOW (ref >60)
GFR calc non Af Amer: 8 mL/min — ABNORMAL LOW (ref >60)
Potassium: 4.9 mEq/L (ref 3.5–5.1)
Sodium: 140 mEq/L (ref 135–145)
Total Bilirubin: 0.6 mg/dL (ref 0.3–1.2)

## 2010-11-27 LAB — PROTIME-INR
INR: 0.96 (ref 0.00–1.49)
Prothrombin Time: 13 seconds (ref 11.6–15.2)

## 2010-11-27 LAB — MRSA PCR SCREENING: MRSA by PCR: NEGATIVE

## 2010-11-27 LAB — CARDIAC PANEL(CRET KIN+CKTOT+MB+TROPI): CK, MB: 1.4 ng/mL (ref 0.3–4.0)

## 2010-11-28 DIAGNOSIS — N19 Unspecified kidney failure: Secondary | ICD-10-CM

## 2010-11-28 DIAGNOSIS — R000 Tachycardia, unspecified: Secondary | ICD-10-CM

## 2010-11-28 LAB — RENAL FUNCTION PANEL
Albumin: 2.6 g/dL — ABNORMAL LOW (ref 3.5–5.2)
BUN: 57 mg/dL — ABNORMAL HIGH (ref 6–23)
CO2: 29 mEq/L (ref 19–32)
Calcium: 9 mg/dL (ref 8.4–10.5)
Chloride: 93 mEq/L — ABNORMAL LOW (ref 96–112)
Chloride: 97 mEq/L (ref 96–112)
Creatinine, Ser: 7.22 mg/dL — ABNORMAL HIGH (ref 0.4–1.5)
GFR calc Af Amer: 24 mL/min — ABNORMAL LOW (ref >60)
GFR calc non Af Amer: 20 mL/min — ABNORMAL LOW (ref >60)
Glucose, Bld: 102 mg/dL — ABNORMAL HIGH (ref 70–99)
Glucose, Bld: 83 mg/dL (ref 70–99)
Potassium: 3.7 mEq/L (ref 3.5–5.1)
Sodium: 139 mEq/L (ref 135–145)

## 2010-11-28 LAB — CARDIAC PANEL(CRET KIN+CKTOT+MB+TROPI)
CK, MB: 1.4 ng/mL (ref 0.3–4.0)
CK, MB: 1.2 ng/mL (ref 0.3–4.0)
Relative Index: INVALID (ref 0.0–2.5)
Total CK: 21 U/L (ref 7–232)
Total CK: 40 U/L (ref 7–232)
Troponin I: 0.02 ng/mL (ref 0.00–0.06)
Troponin I: 0.02 ng/mL (ref 0.00–0.06)

## 2010-11-28 LAB — CARBAMAZEPINE LEVEL, TOTAL: Carbamazepine Lvl: 11.1 ug/mL (ref 4.0–12.0)

## 2010-11-29 LAB — HEPATITIS B SURFACE ANTIGEN: Hepatitis B Surface Ag: NEGATIVE

## 2010-11-29 LAB — RENAL FUNCTION PANEL
Albumin: 2.6 g/dL — ABNORMAL LOW (ref 3.5–5.2)
Calcium: 8.3 mg/dL — ABNORMAL LOW (ref 8.4–10.5)
Glucose, Bld: 93 mg/dL (ref 70–99)
Phosphorus: 6.1 mg/dL — ABNORMAL HIGH (ref 2.3–4.6)
Sodium: 134 mEq/L — ABNORMAL LOW (ref 135–145)

## 2010-11-29 LAB — BASIC METABOLIC PANEL
BUN: 30 mg/dL — ABNORMAL HIGH (ref 6–23)
CO2: 24 mEq/L (ref 19–32)
Chloride: 93 mEq/L — ABNORMAL LOW (ref 96–112)
Creatinine, Ser: 5.65 mg/dL — ABNORMAL HIGH (ref 0.4–1.5)

## 2010-11-29 LAB — CBC
HCT: 34 % — ABNORMAL LOW (ref 39.0–52.0)
MCHC: 33.2 g/dL (ref 30.0–36.0)
Platelets: 177 10*3/uL (ref 150–400)
RDW: 13.7 % (ref 11.5–15.5)

## 2010-11-29 LAB — MAGNESIUM: Magnesium: 2.1 mg/dL (ref 1.5–2.5)

## 2010-12-01 DIAGNOSIS — R000 Tachycardia, unspecified: Secondary | ICD-10-CM

## 2010-12-01 DIAGNOSIS — N19 Unspecified kidney failure: Secondary | ICD-10-CM

## 2010-12-01 LAB — CBC
MCH: 34 pg (ref 26.0–34.0)
MCHC: 33.3 g/dL (ref 30.0–36.0)
Platelets: 165 10*3/uL (ref 150–400)
RDW: 13.3 % (ref 11.5–15.5)

## 2010-12-01 LAB — DIFFERENTIAL
Basophils Relative: 1 % (ref 0–1)
Lymphocytes Relative: 39 % (ref 12–46)
Lymphs Abs: 3.2 10*3/uL (ref 0.7–4.0)
Monocytes Relative: 13 % — ABNORMAL HIGH (ref 3–12)
Neutro Abs: 3.4 10*3/uL (ref 1.7–7.7)
Neutrophils Relative %: 41 % — ABNORMAL LOW (ref 43–77)

## 2010-12-01 LAB — BASIC METABOLIC PANEL
Calcium: 8.8 mg/dL (ref 8.4–10.5)
Creatinine, Ser: 6.17 mg/dL — ABNORMAL HIGH (ref 0.4–1.5)
GFR calc Af Amer: 12 mL/min — ABNORMAL LOW (ref >60)
GFR calc non Af Amer: 10 mL/min — ABNORMAL LOW (ref >60)

## 2010-12-02 ENCOUNTER — Encounter: Payer: Self-pay | Admitting: Ophthalmology

## 2010-12-02 DIAGNOSIS — I472 Ventricular tachycardia, unspecified: Secondary | ICD-10-CM | POA: Insufficient documentation

## 2010-12-02 LAB — BASIC METABOLIC PANEL
Chloride: 91 mEq/L — ABNORMAL LOW (ref 96–112)
Creatinine, Ser: 7.29 mg/dL — ABNORMAL HIGH (ref 0.4–1.5)
GFR calc Af Amer: 10 mL/min — ABNORMAL LOW (ref >60)
Potassium: 5.2 mEq/L — ABNORMAL HIGH (ref 3.5–5.1)
Sodium: 127 mEq/L — ABNORMAL LOW (ref 135–145)

## 2010-12-02 LAB — CBC
MCH: 34.6 pg — ABNORMAL HIGH (ref 26.0–34.0)
Platelets: 171 10*3/uL (ref 150–400)
RBC: 3.24 MIL/uL — ABNORMAL LOW (ref 4.22–5.81)
WBC: 9 10*3/uL (ref 4.0–10.5)

## 2010-12-02 LAB — HCV RNA QUANT
HCV Quantitative Log: 6.78 {Log} — ABNORMAL HIGH (ref <1.63)
HCV Quantitative: 6040000 IU/mL — ABNORMAL HIGH (ref <43)

## 2010-12-02 NOTE — Procedures (Signed)
Summary: Upper Endoscopy  Patient: Sean [name] Note: All result statuses are Final unless otherwise noted.  Tests: (1) Upper Endoscopy (EGD)   EGD Upper Endoscopy       DONE     [facility]     [address]     [place], [place]  [address]           ENDOSCOPY PROCEDURE REPORT           PATIENT:  Sean [name], Sean [name]  MR#:  [id number]     BIRTHDATE:  May 16, 1961, 48 yrs. old  GENDER:  male           ENDOSCOPIST:  Barbette Hair. Arlyce Dice, MD     Referred by:           PROCEDURE DATE:  02/27/2010     PROCEDURE:  EGD with biopsy     ASA CLASS:  Class III     INDICATIONS:  anemia, Evaluate for esophageal varices in a patient     with portal hypertension and/or cirrhosis.           MEDICATIONS:   Fentanyl 100 mcg IV, Versed 10 mg IV, Benadryl 50     mg IV, glycopyrrolate (Robinal) 0.2 mg IV     TOPICAL ANESTHETIC:  Cetacaine Spray           DESCRIPTION OF PROCEDURE:   After the risks benefits and     alternatives of the procedure were thoroughly explained, informed     consent was obtained.  The EG-[id number] ([id number]) endoscope was     introduced through the mouth and advanced to the third portion of     the duodenum, without limitations.  The instrument was slowly     withdrawn as the mucosa was fully examined.     <<PROCEDUREIMAGES>>           Folds were noted. Enlarged but soft folds in gastric fundus. Bxs     taken (see image005).  Otherwise the examination was normal (see     image001, image002, image003, image004, image006, and image007).     Retroflexed views revealed no abnormalities.    The scope was then     withdrawn from the patient and the procedure completed.           COMPLICATIONS:  None           ENDOSCOPIC IMPRESSION:     1) Enlarged Gastric Folds     2) Otherwise normal examination ( no varices)     RECOMMENDATIONS:f/u hemeoccults in 5-7 days           REPEAT EXAM:  In 1 year(s) for EGD, surverillance of varices           ______________________________     Barbette Hair. Arlyce Dice, MD           CC:  Michele Mcalpine, MD, Sheryn Bison, MD           n.     Rosalie Doctor:   Barbette Hair. Kaplan at 02/27/2010 12:25 PM           [place], [phone]  Note: An exclamation mark (!) indicates a result that was not dispersed into the flowsheet. Document Creation Date: 02/27/2010 12:26 PM _______________________________________________________________________  (1) Order result status: Final Collection or observation date-time: 02/27/2010 12:20 Requested date-time:  Receipt date-time:  Reported date-time:  Referring Physician:   Ordering Physician: Melvia Heaps [phone]) Specimen Source:  Source: Launa Grill Order Number: [id number] Lab  site:   Appended Document: Upper Endoscopy Recall is in IDX for 03/03/2011.

## 2010-12-02 NOTE — Letter (Signed)
Summary: [facility]   Imported By: Sherian Rein 04/10/2010 10:45:47  _____________________________________________________________________  External Attachment:    Type:   Image     Comment:   External Document

## 2010-12-02 NOTE — Progress Notes (Signed)
Summary: surgical clearance  Phone Note From Other Clinic Call back at [phone] - Pattricia Boss today   Caller: [facility] - Dr. Emelia Loron Call For: Kriste Basque Summary of Call: Need to see if they can get surgical clearance for this pt from his 03/12/2010 visit.  He's not schedulled to come back in until June 30.  They would like to do surgery and co-ordinate w/dyalisis.   Initial call taken by: Eugene Gavia,  Mar 28, 2010 9:01 AM  Follow-up for Phone Call        Pt is pending Lapraoscopic possible open ventral hernia repair with mesh. Requesting Dr. Kriste Basque surgical clearance from OV 03-12-2010 if possible. Please advise. Zackery Barefoot CMA  Mar 28, 2010 9:13 AM    papers have been faxed for surgical clearance [name] CMA  April 02, 2010 2:15 PM

## 2010-12-02 NOTE — Progress Notes (Signed)
Summary: HFU w/ sn  Phone Note Call from Patient   Caller: Spouse Call For: Sean [name] Summary of Call: per spouse Sean [name]: pt needs a HFU w/ sn. she would like this to be on mon 5/9 as they have some confusion re: meds. can tp see this pt instead? caller says it will need to be this mon or wed only. call work # until 12:30 at [phone] or if after [phone] NOTE: tp is not in monday and has no avail on wed. next week.  Initial call taken by: [name], CNA,  Mar 06, 2010 10:49 AM  Follow-up for Phone Call        Please advise if SN can see this pt on 5/9 for HFU.[name] CMA  Mar 06, 2010 10:51 AM  ok to add pt on wed to SN schedule at 3pm.  thanks [name] CMA  Mar 06, 2010 11:34 AM   pt spouse advised. pt set for appt. [name] CMA  Mar 06, 2010 11:40 AM

## 2010-12-02 NOTE — Assessment & Plan Note (Signed)
Summary: hfu//jrc   CC:  Post hosp ROV... .  History of Present Illness: 50 y/o WM here for a follow up visit... he has multiple medical problems as noted below including Chronic HepC prev [facility], & Chronic renal failure now on Dialysis from DrGoldsborough et al...   ~  Jun10:  BP meds adjusted by DrGoldsborough, and she started Simvastatin as well for his Chol... still smoking 1ppd & can't vs won't quit... HepC clinic has been following his TSH and he reports "it's leveled out &  I don't need medication"... he is here today primarily to get a perscription for his Percocet10's- taking 1 Bid...  ~  Dec10:  he has had another eventful 49mo- he is now off the [facility] therapy as it was not helpful w/ viral titers not responding... [facility] told him there may be some new treatment avail in 2011 & he is holding hope but they have not sched any follow up w/ him... he remains in the Nephrology clinic w/ DrGoldsborough every 31mo- and his Creat has risen from 2.2.5 to 3.6 over the last 5 months... they discussed dialysis at their last visit... he tells me that he is now under the care of a Neurologist in [place] for his seizure disorder & HA's... still smoking 1ppd- denies resp problems, and CXR 11/10 showed chr changes, NAD... 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 he states nerves & depression are doing satis on LEXAPRO20mg /d & Valium 10mg Bid... he has lost weight down to 148# & prognosis is guarded at best.   ~  Mar 12, 2010:  he was hosp 4/26 - 03/04/10 w/ severe anemia, progression to end stage renal disease & started on dialysis.Marland Kitchen. off BP med now due to low BP, and fluid status regulated thru dialysis center... also gets Aranesp & Venofer for anemia (+4u Tx in hosp) from the Nephrologists... GI eval w/ EGD showing enlarged gastric fold, no varices, no bleeding sites; Colonoscopy  showed divertics, hems, & mult adenomatous polyps removed (f/u rec 9yr), stool for occult blood was neg... still smoking 1ppd and can't vs won't quit, he refuses smoking cessation help or Chantix Rx...  he has an abd wall hernia which he claims is quite painful & for which he was give Percocet10 in the hosp w/ consult from DrWyatt, CCS w/ outpt surg pending...  he tells me that he finished the Methadone program 3/11 & has been off all narcs until this hosp...  here for refill of Percocet10 & wants to switch Valium10 for Xanax1mg .    Current Problems:   Hx of SINUSITIS (ICD-473.9) - ENT eval DrShoemaker 2/09 was neg... no complaints or congestion/ drainage/ etc...  CIGARETTE SMOKER (ICD-305.1) - still smoking >1ppd... he knows that he must quit smoking... discussed smoking cessation strategies including cessation programs, counselling, nicotine replacement, and Chantix receptor blockade... the pt is not interested at this time but we left the door open should she like to reconsider at any time.  Hx of ASTHMATIC BRONCHITIS, ACUTE (ICD-466.0) - no recent AB exacerbations...  ~  CXR 11/10 showed chronic changes but NAD.Marland Kitchen.  ~  CXR 4/11 showed chr bronchitic markings, NAD...  HYPERTENSION (ICD-401.9) - prev controlled on combination of Norvasc10, & Furosemide40... off all BP meds since 5/11 discharge & on dialysis now... BP=140/80 today, he is weak, pale, & chr ill appearing...  HYPERCHOLESTEROLEMIA (ICD-272.0) -  started on SIMVASTATIN 20mg /d by DrGoldsborough... I cannot find an FLP on the med & he will check to see if DrGoldsborough wants him to continue this Rx.  HYPOTHYROIDISM, BORDERLINE (ICD-244.9) - labs prev checked frequently from the [facility] showed sl elevated TSH in the 5-6 range... he was tried on LEVOTHYROID 42mcg/d but states he developed HA & was INTOL therefore stopped after only a few doses...  ~  labs 1/10 here showed TSH= 4.19, FreeT3= 2.2 (2.3-4.2), & FreeT4= 0.4 (0.6-1.6)...  Levothy50 started.  ~  labs 3/10 from [facility] showed TSH= 6.57... rec> re-start Levothy50- 1/2 daily.  ~  pt states that Levothy was stopped in the interim- TSH 11/10 off med= 4.49  GERD (ICD-530.81) - Hx of prev Nissen fundoplcation... prev HPylori Rx.  ~  3/10:  given Zofran for nausea by the [facility]...  ~  EGD 5/11 showed enlarged gastric folds, no varices, no bleeding sites... on PROTONIX 40mg  Bid at disch from hosp.  DIVERTICULOSIS OF COLON (ICD-562.10) & COLONIC POLYPS (ICD-211.3)  ~  Colonoscopy 5/11 showed divertics, hems, 7 polypys= adenomatous, no bleeding sites... f/u rec 71yr.  HEPATITIS C (ICD-070.51) - Hx of prev IV drug abuse, and mult tatoos... he has known HepC w/ mild cirrhosis and chr active hepatitis on liver Bx 8/08...reviewed by DrPatterson for GI 8/08 and referred to the [facility]... he began PEGASYS & RIBAVIRIN 12/07/08 via the clinic and he was followed weekly>  ~  last clinic note of 04/09/09 reviewed- viral titer increased slightly (may be developing resistance).  ~  12/10:  he was taken off therapy 7/10- rising viral titers, no option for other Rx at this time... he indicates that they were hopeful that some new Rx would be avail in 2011...  RENAL FAILURE, END STAGE (ICD-585.6) - found to have nephrotic range proteinuria, microscopic hematuria, and Creat= 1.45 w/ eval by DrGoldsborough for Nephrology including a renal biopsy showing Fibrillary GN, mod interstitial fibrosis, and tubular atrophy- stage 3 chronic kidney disease (no spec therapy avail- Rx for his HepC may help)...  ~  labs from [facility] 3/10 showed BUN= 35, Creat= 2.24, K= 5.9.Marland Kitchen. he was started on LASIX by Nephrology.  ~  note from [facility] 03/12/09 reviewed- stable RI, BP meds adjusted.  ~  labs from [facility] 6/10 showed Creat= 2.18, K= 4.8, HCO3= 14  ~  labs 11/10 showed BUN= 33, Creat= 3.6, K=4.1, HCO3= 23  ~  labs from 5/11 hosp showed BUN=53, Creat=5.21, K= 5.0, HCO3=16, Ca=7.2,  Alb=2.0, PTH=339.  Hx of SEIZURE DISORDER (ICD-780.39) - as noted- prev followed at [facility] by DrO'Donovan and is taking CARBITROL 600mg Bid... he is on disability because of his seizure history... now followed at Neurology office in [place].  HEADACHE, MIXED (ICD-784.0) - he was referred to the [facility] after his appt 3/09 w/ severe HA he thought were "clusters"... pt never showed for the appt--- DrGoldsborough gave him Vicodin to use Prn (she doesn't want him on NSAIDs etc)...  ~  6/10: he requests Percocet10 one Bid as nothing else helps his pain... I told him we would fill #60, no early refills, no other narcotics from anyone else or we will insist on his seeking help from a chr pain clinic for management...  ~  12/10: he is on a chr METHADONE program via [facility]" where he has to go every day for his dose.  ~  5/11: he tells me that he finished the methadone clinic program & was  off narcotics until 5/11 hosp w/ pain from abd wall hernia.  ANXIETY (ICD-300.00) - he has mod anxiety and takes VALIUM 10mg Bid... now he requests change from Valium to [facility] 1mg Bid written. DEPRESSION (ICD-311) - on LEXAPRO 20mg /d which he feels is helping. INSOMNIA - he uses the Benzo Qhs.  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   Allergies: No Known Drug Allergies  Past History:  Past Medical History: WEIGHT LOSS (ICD-783.21) Hx of SINUSITIS (ICD-473.9) CIGARETTE SMOKER (ICD-305.1) Hx of ASTHMATIC BRONCHITIS, ACUTE (ICD-466.0) HYPERTENSION (ICD-401.9) HYPERCHOLESTEROLEMIA (ICD-272.0) HYPOTHYROIDISM, BORDERLINE (ICD-244.9) GERD (ICD-530.81) DIVERTICULOSIS OF COLON (ICD-562.10) COLONIC POLYPS (ICD-211.3) HEPATITIS C (ICD-070.51) DRUG ABUSE, HX OF (ICD-V15.89) RENAL FAILURE, END STAGE (ICD-585.6) Hx of SEIZURE DISORDER (ICD-780.39) HEADACHE, MIXED (ICD-784.0) ANXIETY  (ICD-300.00) DEPRESSION (ICD-311) ANEMIA OF CHRONIC DISEASE (ICD-285.29) 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 Inguinal Hernia (1973) Lt. Orchidopexy (1973) Nissen Fundoplication (1999) Left temporal craniotomy/cranioplasty; skull fracture (1978) Left arm AV fistula 5/11 DrEarly  Family History: Reviewed history from 07/25/2008 and no changes required. mother deceased age 47 from copd 1 sibling alive age 46  hx of copd 1 sibling alive age 68 in good health 1 sibling alive age 74 in good health 1 sibling alive age 63 in good health  Social History: Reviewed history from 10/01/2009 and no changes required. current smoker--2ppd quit using drugs in 1995 no etoh use single 1 child  Review of Systems      See HPI       The patient complains of anorexia, weight loss, dyspnea on exertion, headaches, abdominal pain, muscle weakness, and unusual weight change.  The patient denies fever, weight gain, vision loss, decreased hearing, hoarseness, chest pain, syncope, peripheral edema, prolonged cough, hemoptysis, melena, hematochezia, severe indigestion/heartburn, hematuria, incontinence, suspicious skin lesions, transient blindness, difficulty walking, depression, abnormal bleeding, enlarged lymph nodes, and angioedema.    Vital Signs:  Patient profile:   50 year old male Height:      71 inches Weight:      140 pounds BMI:     19.60 O2 Sat:      100 % on Room air Temp:     98.5 degrees F oral Pulse rate:   74 / minute BP sitting:   140 / 80  (right arm)  Vitals Entered By: Renold Genta RCP, LPN (Mar 12, 2010 3:13 PM)  O2 Flow:  Room air CC: Post hosp ROV...  Comments Medications reviewed with patient Renold Genta RCP, LPN  Mar 12, 2010 3:24 PM    Physical Exam  Additional Exam:  WD, WN, 50 y/o WM who appears chronically ill... GENERAL:  Alert & oriented; pleasant & cooperative... HEENT:  [place]/AT, EOM-wnl, PERRLA, EACs-clear, TMs-wnl,  NOSE-clear, THROAT-clear & wnl. NECK:  Supple w/ fairROM; no JVD; normal carotid impulses w/o bruits; no thyromegaly or nodules palpated; no lymphadenopathy. CHEST:  Clear to P & A; without wheezes/ rales/ or rhonchi heard. HEART:  Regular Rhythm; gr 1/6 SEM, w/o rubs or gallops detected. ABDOMEN:  Soft & nontender; normal bowel sounds; no organomegaly or masses palpated. EXT: without deformities, mild arthritic changes; no varicose veins/ venous insuffic/ or edema, no asterixis. NEURO:  CN's intact; no focal neuro deficits... DERM: Pale complexion, no lesions noted; mult tatoos...    MISC. Report  Procedure date:  03/12/2010  Findings:      EXTENSIVE RECORDS REVIEWED:   ~  H&P, Consult notes, DCSummary from 5/11 hosp...  ~  XRays & labs...    Impression & Recommendations:  Problem # 1:  RENAL FAILURE, END STAGE (ICD-585.6) Now on TThS dialysis schedule per Nephrologists...  Problem # 2:  CIGARETTE SMOKER (ICD-305.1) He knows he needs to quit all smoking, and we discussed smoking cessation help avail, but he is not interested...  Problem # 3:  HYPERTENSION (ICD-401.9) Off BP meds and regulated by Nephrology & Dialysis now... The following medications were removed from the medication list:    Norvasc 10 Mg Tabs (Amlodipine besylate) .Marland Kitchen... Take 1 tablet by mouth once a day    Furosemide 40 Mg Tabs (Furosemide) .Marland Kitchen... Take 1 tablet by mouth once a day  Problem # 4:  HYPERCHOLESTEROLEMIA (ICD-272.0) Still on the Zocor started by [facility] & he will inquire whether they want him to continue... needs FLP as well & will discuss w/ Renal Team. His updated medication list for this problem includes:    Simvastatin 20 Mg Tabs (Simvastatin) .Marland Kitchen... Take 1 tab by mouth at bedtime  Problem # 5:  HYPOTHYROIDISM, BORDERLINE (ICD-244.9) We will f/u TSH & thyroid function on return...  Problem # 6:  GERD (ICD-530.81) GI is stable s/p EGD & colon>> stool was heme neg & no blleding sites  found... The following medications were removed from the medication list:    Sodium Bicarbonate 650 Mg Tabs (Sodium bicarbonate) .Marland Kitchen... Take one tablet by mouth two times a day His updated medication list for this problem includes:    Protonix 40 Mg Tbec (Pantoprazole sodium) .Marland Kitchen... 1 two times a day  Problem # 7:  COLONIC POLYPS (ICD-211.3) They rec f/u colonoscopy by DrPatterson in 6yr...  Problem # 8:  HEPATITIS C (ICD-070.51) Aware>  no change, see above...  Problem # 9:  Hx of SEIZURE DISORDER (ICD-780.39) Stable on med... His updated medication list for this problem includes:    Carbatrol 300 Mg Cp12 (Carbamazepine) .Marland Kitchen... Take 2 capsules by mouth two times a day  Problem # 10:  ANEMIA OF CHRONIC DISEASE (ICD-285.29) Treated by Nephrology as part of his ESRD protocol... His updated medication list for this problem includes:    Aranesp (albumin Free) 200 Mcg/0.81ml Soln (Darbepoetin alfa-polysorbate) .Marland Kitchen... Per nephrology...    Venofer 20 Mg/ml Soln (Iron sucrose) .Marland Kitchen... Per nephrology...  Complete Medication List: 1)  Simvastatin 20 Mg Tabs (Simvastatin) .... Take 1 tab by mouth at bedtime 2)  Protonix 40 Mg Tbec (Pantoprazole sodium) .Marland Kitchen.. 1 two times a day 3)  Calcium Acetate 667 Mg Caps (Calcium acetate (phos binder)) .Marland Kitchen.. 1 three times a day 4)  Nephro-vite 0.8 Mg Tabs (B complex-c-folic acid) .Marland Kitchen.. 1 at bedtime 5)  Carbatrol 300 Mg Cp12 (Carbamazepine) .... Take 2 capsules by mouth two times a day 6)  Percocet 10-325 Mg Tabs (Oxycodone-acetaminophen) .... Take one tab by mouth up to three times daily as needed for pain.Marland KitchenMarland Kitchen 7)  Lexapro 20 Mg Tabs (Escitalopram oxalate) .... Take 1 tab by mouth once daily.Marland KitchenMarland Kitchen 8)  Alprazolam 1 Mg Tabs (Alprazolam) .... Take 1 tab by mouth two times a day as direceted for nerves.Marland KitchenMarland Kitchen 9)  Aranesp (albumin Free) 200 Mcg/0.48ml Soln (Darbepoetin alfa-polysorbate) .... Per nephrology... 10)  Venofer 20 Mg/ml Soln (Iron sucrose) .... Per  nephrology...  Patient Instructions: 1)  Today we updated your med list- see below.... 2)  We wrote new perscriptions for PERCOCET for pain (not to exceed 3 per day);  and XANAX for nerves take 1 two times  a day instead of the Valium.Marland KitchenMarland Kitchen 3)  Good luck w/ the Dialysis program... 4)  Call if I can be of assistance in any way... Prescriptions: PERCOCET 10-325 MG TABS (OXYCODONE-ACETAMINOPHEN) take one tab by mouth up to three times daily as needed for pain...  #50 x 0   Entered and Authorized by:   Michele Mcalpine MD   Signed by:   Michele Mcalpine MD on 03/12/2010   Method used:   Print then Give to Patient   RxID:   [id number] ALPRAZOLAM 1 MG TABS (ALPRAZOLAM) take 1 tab by mouth two times a day as direceted for nerves...  #60 x 5   Entered and Authorized by:   Michele Mcalpine MD   Signed by:   Michele Mcalpine MD on 03/12/2010   Method used:   Print then Give to Patient   RxID:   [id number]

## 2010-12-02 NOTE — Progress Notes (Signed)
Summary: headaches  Phone Note Call from Patient Call back at Home Phone [phone]   Caller: Patient Call For: nadel Reason for Call: Talk to Nurse Summary of Call: pt unable to do his dialysis due to headaches.  Has appt for neurology in 4 weeks(the soonest appt).  Please advise pt. Initial call taken by: Eugene Gavia,  June 05, 2010 4:17 PM  Follow-up for Phone Call        Spoke with pt.  He c/o HA x 2 wks- sharp pains "like a migraine".  Also he states that he has felt nauseated and has poor appetite.  He states that he knows that he is not supposed to take tylenol or advil but has tried these without relief.  He states dialysis seems to make HA worsen, so has missed last couple appts.  Has ov with neuro in 4 wks.  Pls advise thanks! NKDA Follow-up by: Vernie Murders,  June 05, 2010 4:25 PM  Additional Follow-up for Phone Call Additional follow up Details #1::        called and spoke with pt and he stated that he is ok with the vicodin---he has taken this before and has not had a problem with this.  the vicodin has been called to pts pharmacy and he is aware [name] CMA  June 05, 2010 4:53 PM     New/Updated Medications: VICODIN 5-500 MG TABS (HYDROCODONE-ACETAMINOPHEN) take one tablet by mouth every 6 hours as needed for pain Prescriptions: VICODIN 5-500 MG TABS (HYDROCODONE-ACETAMINOPHEN) take one tablet by mouth every 6 hours as needed for pain  #100 x 2   Entered by:   [name] CMA   Authorized by:   Michele Mcalpine MD   Signed by:   [name] CMA on 06/05/2010   Method used:   Telephoned to ...       [facility] (retail)       [address]       [place], [place]  [id number]       Ph: [phone]       Fax: [phone]   RxID:   [id number]

## 2010-12-02 NOTE — Procedures (Signed)
  NAME:  JMARION, CHRISTIANO NO.:  [id number]  MEDICAL RECORD NO.:  [id number]          PATIENT TYPE:  INP  LOCATION:  2004                         FACILITY:  [facility]  PHYSICIAN:  Vesta Mixer, M.D. DATE OF BIRTH:  [birth year]  DATE OF PROCEDURE:  11/28/2010 DATE OF DISCHARGE:                           CARDIAC CATHETERIZATION   Sean [name] is a 50 year old gentleman with a history of end-stage renal disease.  He was admitted yesterday to the hospital when he presented with sustained ventricular tachycardia.  He converted spontaneously to sinus rhythm.  He is scheduled for heart catheterization today for further evaluation.  The procedure was left heart catheterization with coronary angiography.  The right femoral artery was easily cannulated using modified Seldinger technique.  HEMODYNAMICS:  LV pressure is 139/6.  Aortic pressure is 135/75.  ANGIOGRAPHY:  Left main:  The left main is large and normal.  Left anterior descending artery is smooth and normal.  There is a large diagonal branch, which is normal.  The left circumflex artery is fairly large vessel.  It gives off a terminal obtuse marginal branch, which is normal.  There are two separate ramus intermediate vessels.  These vessels are normal.  The right coronary artery is large and dominant.  There is a proximal 20- 30% stenosis, which is non-flow obstructive.  The remaining right coronary artery is normal.  The posterior descending artery and the posterolateral segment artery are normal.  The left ventriculogram was performed in the 30 RAO position.  It reveals vigorous left ventricular systolic function.  Ejection fraction is around 65-70%.  There may be a slight degree of mid LVOT obstruction (dynamic obstruction).  COMPLICATIONS:  None.  CONCLUSION: 1. Minor coronary artery irregularities. 2. Vigorous left ventricular systolic function with a possible mid     LVOT dynamic obstruction. 3.  Sustained monomorphic ventricular tachycardia.  We will share these     results with the electrophysiologist.  Their initial recommendation     was to start amiodarone 400 mg every 8 hours.  He will need at     least 10 g of amiodarone for load.     Vesta Mixer, M.D.     PJN/MEDQ  D:  11/28/2010  T:  11/29/2010  Job:  [id number]  cc:   Duke Salvia, MD, [facility] Duke Salvia. Eliott Nine, M.D.  Electronically Signed by Kristeen Miss M.D. on 12/02/2010 12:17:01 PM

## 2010-12-02 NOTE — Procedures (Signed)
Summary: Colonosocpy/[facility]  Colonosocpy/[facility]   Imported By: Sherian Rein 03/17/2010 10:43:09  _____________________________________________________________________  External Attachment:    Type:   Image     Comment:   External Document

## 2010-12-02 NOTE — Procedures (Signed)
Summary: EGD/[facility]  EGD/[facility]   Imported By: Sherian Rein 03/17/2010 10:41:54  _____________________________________________________________________  External Attachment:    Type:   Image     Comment:   External Document

## 2010-12-02 NOTE — Progress Notes (Signed)
Summary: rx  Phone Note Call from Patient Call back at Home Phone [phone] Call back at [phone]   Caller: Other Relative-Carolyn Call For: nadel Summary of Call: pt is very nauseated, can he get rx for the nausea? [facility] [phone] Initial call taken by: Eugene Gavia,  January 10, 2010 8:20 AM  Follow-up for Phone Call        Pt c/o nausea and diarrhea x 2 days, and low grade fever. Pt denies, abdominal cramping, and vomiting. Pt requesting medication. Please advise. Zackery Barefoot CMA  January 10, 2010 9:11 AM   Additional Follow-up for Phone Call Additional follow up Details #1::        per SN---ok for pt to have phenergan 25mg   #30  1 by mouth every 6 hours as needed for nausea.  this has been sent to the pharmacy and the pt is aware. [name] CMA  January 10, 2010 10:15 AM     New/Updated Medications: PROMETHAZINE HCL 25 MG TABS (PROMETHAZINE HCL) take one tablet every 6 hours as needed for nausea Prescriptions: PROMETHAZINE HCL 25 MG TABS (PROMETHAZINE HCL) take one tablet every 6 hours as needed for nausea  #30 x 0   Entered by:   [name] CMA   Authorized by:   Michele Mcalpine MD   Signed by:   [name] CMA on 01/10/2010   Method used:   Electronically to        [facility] (retail)       [address]       [place], [place]  [address]       Ph: [phone]       Fax: [phone]   RxID:   [id number]

## 2010-12-02 NOTE — Procedures (Signed)
Summary: Colonoscopy  Patient: [name] Note: All result statuses are Final unless otherwise noted.  Tests: (1) Colonoscopy (COL)   COL Colonoscopy           DONE     [facility]     [address]     [place], [place]  [address]           COLONOSCOPY PROCEDURE REPORT           PATIENT:  Sean [name], Sean [name]  MR#:  [id number]     BIRTHDATE:  Mar 15, 1961, 48 yrs. old  GENDER:  male     ENDOSCOPIST:  Wilhemina Bonito. Eda Keys, MD     REF. BY:  [facility],     PROCEDURE DATE:  03/01/2010     PROCEDURE:  Colonoscopy with snare polypectomy x 7;     EXTENDED SERVICE FOR MULTIPLE     POLYS AND TIME (>30 MINUTES)     ASA CLASS:  Class III     INDICATIONS:  anemia, history of pre-cancerous (adenomatous) colon     polyps ; multiple adenoma 07-2007 with Dr. Jarold Motto     MEDICATIONS:   Fentanyl 100 mcg IM, Versed 10 mg IV, Benadryl 50     mg IV           DESCRIPTION OF PROCEDURE:   After the risks benefits and     alternatives of the procedure were thoroughly explained, informed     consent was obtained.  Digital rectal exam was performed and     revealed no abnormalities.   The EC-[id number] ([id number]) endoscope was     introduced through the anus and advanced to the cecum, which was     identified by both the appendix and ileocecal valve, limited by     fair prep.    The quality of the prep was adequate, using Colyte.     The instrument was then slowly withdrawn as the colon was fully     examined.     <<PROCEDUREIMAGES>>           FINDINGS:  THE PREP WAS FAIR BUT UPGRADED TO ADEQUATE WIT     HVIGOROUS IRRIGATION AND SUCTION. There were multiple polyps ,     both sessile and adenomatous, measuring between 5mm and 12mm,     identified and removed. Polyps (located from cecum to descending     colon) were snared without cautery. Retrieval was successful.     Mild diverticulosis was found in the sigmoid colon.   Retroflexed     views in the rectum revealed internal hemorrhoids.     The scope     was then withdrawn from the patient and the procedure completed.     COMPLICATIONS:  None     ENDOSCOPIC IMPRESSION:     1) Polyps, multiple (7) - REMOVED     2) Mild diverticulosis in the sigmoid colon     3) Internal hemorrhoids     4) FAIR ADEQUATE PREP           RECOMMENDATIONS:     1) Follow up colonoscopy WITH DR DAVID [name] in ONE year     (MULTIPLE POLYPS AND PREP)           ______________________________     Wilhemina Bonito. Eda Keys, MD           CC:  Michele Mcalpine, MD; Sheryn Bison, MD; The Patient  n.     eSIGNED:   Wilhemina Bonito. Eda Keys at 03/01/2010 10:40 AM           [facility], [phone]  Note: An exclamation mark (!) indicates a result that was not dispersed into the flowsheet. Document Creation Date: 03/01/2010 10:42 AM _______________________________________________________________________  (1) Order result status: Final Collection or observation date-time: 03/01/2010 10:27 Requested date-time:  Receipt date-time:  Reported date-time:  Referring Physician:   Ordering Physician: Fransico Setters [phone]) Specimen Source:  Source: Launa Grill Order Number: [id number] Lab site:

## 2010-12-02 NOTE — Progress Notes (Signed)
Summary: headache pain-ok to take 2-5mg  vicodin  Phone Note Call from Patient   Caller: Patient Call For: NADEL Summary of Call: PT IS EXPERICING SEVERE HEADACHE PAINS DURING DIALYSIS , HEADACHE MEDICATION NOT [facility]- [phone] Initial call taken by: Rickard Patience,  July 25, 2010 9:36 AM  Follow-up for Phone Call        Pt states the vicodin 5-500 is not helping his headaches. He states he is taking 2 tablets to get reflief and wants to know is the strength can be increased. Pt also stated that he has not heard from the [facility], so I called them and they have not received the referral so I have refaxed it to the clinic. Please advise on rx. [name] CMA  July 25, 2010 11:06 AM   Additional Follow-up for Phone Call Additional follow up Details #1::        per SN----ok to take 2 of the vicodin as needed for the ha's with the dialysis---libby has called and checked on the headache clinic and she will call back on monday since they close today at 11:45. pt is aware of this and he is requesting that SN change the vicodin from the 5 to the 10mg  so he will only need to take one tablet for his headaches.  please advise. thanks [name] CMA  July 25, 2010 2:25 PM     Additional Follow-up for Phone Call Additional follow up Details #2::    per SN---he will need to cont the vicodin 5 and take 2 tablets for now and we will wait to see what the headache clinic recommends. thanks [name] CMA  July 25, 2010 2:49 PM   Pt advised. [name] CMA  July 25, 2010 2:57 PM

## 2010-12-02 NOTE — Letter (Signed)
Summary: [facility]  [facility]   Imported By: Lester [place] 01/10/2010 10:20:47  _____________________________________________________________________  External Attachment:    Type:   Image     Comment:   External Document

## 2010-12-02 NOTE — Letter (Signed)
Summary: Patient Notice- Polyp Results  [facility]  [address] [place], [place] [address]   Phone: [phone]  Fax: [phone]        Mar 04, 2010 MRN: [id number]    [facility] [address] [place], [place]  [address]    Dear Mr. Bartoli,  I am pleased to inform you that the colon polyps removed during your recent colonoscopy were found to be benign (no cancer detected) upon pathologic examination. These were, however, precancerous.  I recommend you have a repeat colonoscopy examination in ONE year, with your primary gastroenterologist, Dr. Jarold Motto, to look for recurrent polyps, as having colon polyps increases your risk for having recurrent polyps or even colon cancer in the future.  Should you develop new or worsening symptoms of abdominal pain, bowel habit changes or bleeding from the rectum or bowels, please schedule an evaluation with either your primary care physician or with me.  Additional information/recommendations:  __ No further action with gastroenterology is needed at this time. Please      follow-up with your primary care physician for your other healthcare      needs.  Please call us if you are having persistent problems or have questions about your condition that have not been fully answered at this time.  Sincerely,  Hilarie Fredrickson MD  This letter has been electronically signed by your physician.  Appended Document: Patient Notice- Polyp Results Letter mailed  Appended Document: recall colon    Clinical Lists Changes  Observations: Added new observation of COLONOSCOPY: 03/03/2011 (03/01/2010 11:29)      Appended Document: Patient Notice- Polyp Results Recall entered in ID[facility] for 03/03/2011

## 2010-12-02 NOTE — Progress Notes (Signed)
Summary: Referral for HA clinic  Phone Note Call from Patient   Caller: pt's girlfrined rosa Call For: nadel Summary of Call: caller requests to speak to nurse re: pt's pain meds. rosa @ [phone] Initial call taken by: [name], CNA,  July 09, 2010 10:23 AM  Follow-up for Phone Call        called and spoke with rosa---pts girlfriend---she stated that pt is having severe migraines with the dialysis---the vicodin is not working and pt is requesting to have percocet.  please advise. thanks [name] CMA  July 09, 2010 10:58 AM   Additional Follow-up for Phone Call Additional follow up Details #1::        per SN---he does not write for percocet---we can refer him to nuerology or the HA clinic for management of chronic HA's   or he can also ask the kidney doctors to help with this dialysis related problem.  thanks [name] CMA  July 09, 2010 11:23 AM   New Problems: HEADACHE, CHRONIC (ICD-784.0)   Additional Follow-up for Phone Call Additional follow up Details #2::    Called, spoke with [facility].  Informed her of above statement per SN.  She states they have already talked with pt's kidney doctors about this and was told to call Dr. Kriste Basque and pt has already seen a doctor at [facility].  They are giving him a med that pt had before and "caused him to have seizures and are not listening to [place]."  Requesting referral to HA clinic -- order placed.  Rosa aware they will receive call for HA clinic appt date/time/location.  She verbalized understanding.   Follow-up by: Gweneth Dimitri RN,  July 09, 2010 11:33 AM  New Problems: HEADACHE, CHRONIC (ICD-784.0)

## 2010-12-02 NOTE — Letter (Signed)
Summary: Results Letter  [facility]  [address] [place], [place] [address]   Phone: [phone]  Fax: [phone]        Mar 03, 2010 MRN: [id number]    [facility] [address] [place], [place]  [address]    Dear Sean [name],  Your biopsies revealed  the presence of a bacteria called H. Pylori.  This is associated with recurrent inflammation of the stomach and duodenum, and recurrent ulcer disease.  My nurse will be calling in a prescription for treatment.            Sincerely,  Louis Meckel MD  This letter has been electronically signed by your physician.  Appended Document: Results Letter (Per Jennye Moccasin PAC, positive H-Pylori was treated by the hospitalist.) Letter mailed to patient.

## 2010-12-02 NOTE — Progress Notes (Signed)
Summary: headache--vicodin > ok refill x1, needs appt  Phone Note Call from Patient Call back at Home Phone [phone]   Caller: Charna Busman- Call For: nadel Reason for Call: Talk to Nurse Summary of Call: Patient had a friend call and he is asking for vicodin,  he has a really bad headache.  [facility] Initial call taken by: Lehman Prom,  September 01, 2010 2:52 PM  Follow-up for Phone Call        called spoke with patient who c/o left sided toothache x19month, preventing him from eating (no teeth on the right side).  states he still gets the migraines with dialysis but is "trying to work them."  no money for dentist appt at this time.  is requesting refill on the vicodin.  [facility], last refill was 9.23.11 with no additional refills pending.  please advise if okay to refill, thanks!  Follow-up by: Boone Master CNA/MA,  September 01, 2010 4:15 PM  Additional Follow-up for Phone Call Additional follow up Details #1::        per SN---he was last seen 04/2010-----he got #100 vicodin on 8/4 with 2 refills remaining 1 by mouth every 6 hours----ok for him to get refill of vicodin #100  1 by mouth every 6 hours as needed for severe pain-with no refills--he will need rov before any other refills can be given--LMOMTCB [name] [facility]  September 01, 2010 5:32 PM     Additional Follow-up for Phone Call Additional follow up Details #2::    pt returned call.  advised of SN's recs as stated above.  pt verbalized his understanding.  appt scheduled w/ SN forst available 1.6.12 @ 12n.  pt okay with this date and time.  refill vicodin called into [facility]. Boone Master CNA/MA  September 03, 2010 10:10 AM   Prescriptions: VICODIN 5-500 MG TABS (HYDROCODONE-ACETAMINOPHEN) take one tablet by mouth every 6 hours as needed for pain  #100 x 0   Entered by:   Boone Master CNA/MA   Authorized by:   Michele Mcalpine MD   Signed by:   Boone Master CNA/MA on 09/03/2010   Method used:    Telephoned to ...       [facility] (retail)       [address]       [place], [place]  [id number]       Ph: [phone]       Fax: [phone]   RxID:   [id number]

## 2010-12-02 NOTE — Progress Notes (Signed)
Summary: rx  Phone Note Call from Patient Call back at [phone]   Caller: Iris Pert Call For: Kriste Basque Reason for Call: Talk to Nurse Summary of Call: pt is on Dyalisis - SN had him in xanex  -  not working to calm pt.  Can he go back on Valium?   [facility] Initial call taken by: Eugene Gavia,  April 25, 2010 8:52 AM  Follow-up for Phone Call        called and spoke with rosa and she stated that pt is having alot more anxiety after his hernia surgery and he thinks that the valium works better for him than the xanax---requesting to switch back to valium---pt does take this prior to dialysis and he has that today at 1.  please advise  thanks [name] CMA  April 25, 2010 8:58 AM   Additional Follow-up for Phone Call Additional follow up Details #1::        called and spoke with the pharmacy and cancelled the xanax rx and called in valium 5mg   1 by mouth three times a day with 5 refills  #90.  per SN---called and lmom for rosa to make her aware. [name] CMA  April 25, 2010 11:38 AM     New/Updated Medications: VALIUM 5 MG TABS (DIAZEPAM) take one tablet by mouth three times a day as needed for nerves Prescriptions: VALIUM 5 MG TABS (DIAZEPAM) take one tablet by mouth three times a day as needed for nerves  #90 x 5   Entered by:   [name] CMA   Authorized by:   Michele Mcalpine MD   Signed by:   [name] CMA on 04/25/2010   Method used:   Telephoned to ...       [facility] (retail)       [address]       [place], [place]  [id number]       Ph: [phone]       Fax: [phone]   RxID:   [id number]

## 2010-12-02 NOTE — Consult Note (Signed)
NAME:  [name], [name] NO.:  [id number]  MEDICAL RECORD NO.:  [id number]          PATIENT TYPE:  INP  LOCATION:  2607                         FACILITY:  [facility]  PHYSICIAN:  Vesta Mixer, M.D. DATE OF BIRTH:  03-30-1961  DATE OF CONSULTATION: DATE OF DISCHARGE:                                CONSULTATION   Sean [name] is a 50 year old gentleman with a history of end-stage renal disease - currently on dialysis.  He has a history of hypertension, gastroesophageal reflux disease, and history of substance abuse.  He was admitted from [facility] for sustained ventricular tachycardia.  The patient has a history of chest pains in the remote past and also some recently.  These episodes of chest pain are described as a tightness.  He also has some sharp episodes of chest pain.  These typically occur with exertion.  He does not get any regular exercise.  He presented to the dialysis center today with 2-3 days of marked fatigue and shortness of breath.  He was found to have a heart rate of 160.  Subsequent EKG revealed that he had ventricular tachycardia, and he was transferred up to [facility] for further evaluation.  The patient notes feeling dizzy and weak for the past several days.  He did not have any episodes of syncope.  He has not had any specific chest pain over the past couple of days.  CURRENT MEDICATIONS: 1. Prilosec 20 mg twice a day. 2. PhosLo 2 tablets three times a day with meals. 3. Zocor 20 mg a day. 4. Renal caps once a day. 5. Lexapro 20 mg a day. 6. Tegretol 600 mg twice a day. 7. Viagra as needed.  He has no known drug allergies.  PAST MEDICAL HISTORY: 1. History of end-stage renal disease. 2. History of chronic pain. 3. Anxiety/depression. 4. Hypertension. 5. Gastroesophageal reflux disease.  SOCIAL HISTORY:  The patient smokes one pack of cigarettes a day.  He does not drink alcohol.  He has an extensive drug use in  the past.  He used to use cocaine, he still occasionally smokes pot.  FAMILY HISTORY:  Noncontributory.  REVIEW OF SYSTEMS:  As noted in the HPI.  All other systems are negative.  PHYSICAL EXAMINATION:  GENERAL:  He is a middle-aged gentleman, in no acute distress.  He is currently in sinus rhythm. VITAL SIGNS:  His blood pressure is 135/70.  His heart rate is 70. HEENT:  His mucous membranes are moist.  His pupils are equally round and reactive to light. NECK:  Supple.  His carotids are 2+ with no bruits.  There is no JVD. LUNGS:  Clear. HEART:  Regular rate S1 and S2.  Chest wall is nontender. ABDOMEN:  Good bowel sounds and it is nontender. EXTREMITIES:  He has no clubbing, cyanosis, or edema. SKIN:  He has multiple tattoos.  He has a dialysis port in his left upper arm.  His EKG reveals ventricular tachycardia.  It is a monomorphic VT with a right bundle-branch block morphology.  His resting EKG reveals normal sinus rhythm with no evidence of  bundle-branch block.  Laboratory data is pending.  Mr. Spieker presents with episodes of sustained monomorphic ventricular tachycardia.  I suspect that this is coming from his left ventricle.  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  He is not having any symptoms, and he is not having any arrhythmias at present.  He has a history of using cocaine but only in the very remote past.  It has been 10 or so years since he used cocaine.  We will schedule him for heart catheterization tomorrow. We will also get an EP consult.  We may need to start him on amiodarone if he has any further arrhythmias.  We agree with dialyzing him here on 2600 and set him up with the dialysis unit.  We will check cardiac enzymes and check labs.     Vesta Mixer, M.D.     PJN/MEDQ  D:  11/27/2010  T:  11/28/2010  Job:  [id number]  Electronically Signed by Kristeen Miss M.D. on 12/02/2010 12:16:57 PM

## 2010-12-02 NOTE — Assessment & Plan Note (Signed)
Summary: 6 MONTHS/ MBW   CC:  6 week ROV & add-on for stomach pains....  History of Present Illness: 50 y/o WM here for a follow up visit... he has multiple medical problems as noted below including Chronic HepC prev [facility], & Chronic renal failure now on Dialysis from DrGoldsborough et al...   ~  Jun10:  BP meds adjusted by DrGoldsborough, and she started Simvastatin as well for his Chol... still smoking 1ppd & can't vs won't quit... HepC clinic has been following his TSH and he reports "it's leveled out &  I don't need medication"... he is here today primarily to get a perscription for his Percocet10's- taking 1 Bid...  ~  Dec10:  he has had another eventful 59mo- he is now off the [facility] therapy as it was not helpful w/ viral titers not responding... [facility] told him there may be some new treatment avail in 2011 & he is holding hope but they have not sched any follow up w/ him... he remains in the Nephrology clinic w/ DrGoldsborough every 57mo- and his Creat has risen from 2.2.5 to 3.6 over the last 5 months... they discussed dialysis at their last visit... he tells me that he is now under the care of a Neurologist in [place] for his seizure disorder & HA's... still smoking 1ppd- denies resp problems, and CXR 11/10 showed chr changes, NAD... 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 he states nerves & depression are doing satis on LEXAPRO20mg /d & Valium 10mg Bid... he has lost weight down to 148# & prognosis is guarded at best.   ~  Mar 12, 2010:  he was hosp 4/26 - 03/04/10 w/ severe anemia, progression to end stage renal disease & started on dialysis.Marland Kitchen. off BP med now due to low BP, and fluid status regulated thru dialysis center... also gets Aranesp & Venofer for anemia (+4u Tx in hosp) from the Nephrologists... GI eval w/ EGD showing enlarged gastric fold, no varices, no  bleeding sites; Colonoscopy showed divertics, hems, & mult adenomatous polyps removed (f/u rec 23yr), stool for occult blood was neg... still smoking 1ppd and can't vs won't quit, he refuses smoking cessation help or Chantix Rx...  he has an abd wall hernia which he claims is quite painful & for which he was give Percocet10 in the hosp w/ consult from DrWyatt, CCS w/ outpt surg pending...  he tells me that he finished the Methadone program 3/11 & has been off all narcs until this hosp...  here for refill of Percocet10 & wants to switch Valium10 for Xanax1mg .   ~  May 01, 2010:  add-on for "stomach problem" meaning diaarhea he says & unable to finish dialysis due to diarrhea... notes this all started after his hernia repair in 04/02/10- lap ventral hernia repair w/ mesh & lysis of adhesions by DrWakefield... hx remote Nissen & prev HPylori Rx... he had EGD & Colon recently by GI- Arlyce Dice & Marina Goodell- see above... we discussed poss IBS & Rx w/ Bentyl + fiber w/ GI follow up recommended...  Also c/o "need more Valium" on 5mg  Tid for nerves & he wants 10mg Tid due to dialysis- I told him this was the max.    Current Problems:   Hx of SINUSITIS (ICD-473.9) - ENT eval DrShoemaker 2/09 was neg... no complaints or congestion/ drainage/ etc...  CIGARETTE SMOKER (ICD-305.1) - still smoking >1ppd.Marland KitchenMarland Kitchen  he knows that he must quit smoking... discussed smoking cessation strategies including cessation programs, counselling, nicotine replacement, and Chantix receptor blockade... the pt is not interested at this time but we left the door open should she like to reconsider at any time.  Hx of ASTHMATIC BRONCHITIS, ACUTE (ICD-466.0) - no recent AB exacerbations...  ~  CXR 11/10 showed chronic changes but NAD.Marland Kitchen.  ~  CXR 4/11 showed chr bronchitic markings, NAD...  HYPERTENSION (ICD-401.9) - prev controlled on combination of Norvasc10, & Furosemide40... off all BP meds since 5/11 discharge & on dialysis now... BP=140/68 today, he  is weak, pale, & chr ill appearing...  HYPERCHOLESTEROLEMIA (ICD-272.0) - started on SIMVASTATIN 20mg /d by DrGoldsborough... I cannot find an FLP on the med & he will check to see if DrGoldsborough wants him to continue this Rx... we discussed checking FLP on ret OV.  HYPOTHYROIDISM, BORDERLINE (ICD-244.9) - labs prev checked frequently from the [facility] showed sl elevated TSH in the 5-6 range... he was tried on LEVOTHYROID 46mcg/d but states he developed HA & was INTOL therefore stopped after only a few doses...  ~  labs 1/10 here showed TSH= 4.19, FreeT3= 2.2 (2.3-4.2), & FreeT4= 0.4 (0.6-1.6)... Levothy50 started.  ~  labs 3/10 from [facility] showed TSH= 6.57... rec> re-start Levothy50- 1/2 daily.  ~  pt states that Levothy was stopped in the interim- TSH 11/10 off med= 4.49  GERD (ICD-530.81) - Hx of prev Nissen fundoplcation... prev HPylori Rx.  ~  3/10:  given Zofran for nausea by the [facility]...  ~  EGD 5/11 showed enlarged gastric folds, no varices, no bleeding sites... on PROTONIX 40mg  Bid at disch from hosp.  DIVERTICULOSIS OF COLON (ICD-562.10) & COLONIC POLYPS (ICD-211.3)  ~  Colonoscopy 5/11 showed divertics, hems, 7 polypys= adenomatous, no bleeding sites... f/u rec 83yr.  HEPATITIS C (ICD-070.51) - Hx of prev IV drug abuse, and mult tatoos... he has known HepC w/ mild cirrhosis and chr active hepatitis on liver Bx 8/08...reviewed by DrPatterson for GI 8/08 and referred to the [facility]... he began PEGASYS & RIBAVIRIN 12/07/08 via the clinic and he was followed weekly>  ~  last clinic note of 04/09/09 reviewed- viral titer increased slightly (may be developing resistance).  ~  12/10:  he was taken off therapy 7/10- rising viral titers, no option for other Rx at this time... he indicates that they were hopeful that some new Rx would be avail in 2011...  RENAL FAILURE, END STAGE (ICD-585.6) - found to have nephrotic range proteinuria, microscopic hematuria, and Creat= 1.45 w/  eval by DrGoldsborough for Nephrology including a renal biopsy showing Fibrillary GN, mod interstitial fibrosis, and tubular atrophy- stage 3 chronic kidney disease (no spec therapy avail- Rx for his HepC may help)... since then he has progressed to ESRD & started on dialysis.  ~  labs from [facility] 3/10 showed BUN= 35, Creat= 2.24, K= 5.9.Marland Kitchen. he was started on LASIX by Nephrology.  ~  note from [facility] 03/12/09 reviewed- stable RI, BP meds adjusted.  ~  labs from [facility] 6/10 showed Creat= 2.18, K= 4.8, HCO3= 14  ~  labs 11/10 showed BUN= 33, Creat= 3.6, K=4.1, HCO3= 23  ~  labs from 5/11 hosp showed BUN=53, Creat=5.21, K= 5.0, HCO3=16, Ca=7.2, Alb=2.0, PTH=339  ~  5/11:  started on dialysis by Nephrology & they do his labs.  Hx of SEIZURE DISORDER (ICD-780.39) - as noted- prev followed at [facility] by DrO'Donovan and is taking CARBITROL 600mg Bid.Marland KitchenMarland Kitchen  he is on disability because of his seizure history... now followed at Neurology office in [place].  HEADACHE, MIXED (ICD-784.0) - he was referred to the [facility] after his appt 3/09 w/ severe HA he thought were "clusters"... pt never showed for the appt--- DrGoldsborough gave him Vicodin to use Prn (she doesn't want him on NSAIDs etc)...  ~  6/10: he requests Percocet10 one Bid as nothing else helps his pain... I told him we would fill #60, no early refills, no other narcotics from anyone else or we will insist on his seeking help from a chr pain clinic for management...  ~  12/10: he is on a chr METHADONE program via [facility]" where he has to go every day for his dose.  ~  5/11: he tells me that he finished the methadone clinic program & was off narcotics until 5/11 hosp w/ pain from abd wall hernia.  ANXIETY (ICD-300.00) - he has mod anxiety and takes VALIUM 10mg Bid... now he requests change from Valium to [facility] 1mg Bid written. DEPRESSION (ICD-311) - on LEXAPRO 20mg /d which he feels is helping. INSOMNIA - he uses the Benzo  Qhs.  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   Preventive Screening-Counseling & Management  Alcohol-Tobacco     Smoking Status: current     Packs/Day: 1ppd  Allergies (verified): No Known Drug Allergies  Comments:  Nurse/Medical Assistant: The patient's medications and allergies were reviewed with the patient and were updated in the Medication and Allergy Lists.  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 Inguinal Hernia (1973) Lt. Orchidopexy (1973) Nissen Fundoplication (1999) Left temporal craniotomy/cranioplasty; skull fracture (1978) Left arm AV fistula 5/11 DrEarly  Family History: Reviewed history from 07/25/2008 and no changes required. mother deceased age 52 from copd 1 sibling alive age 35  hx of copd 1 sibling alive age 50 in good health 1 sibling alive age 58 in good health 1 sibling alive age 9 in good health  Social History: Reviewed history from 10/01/2009 and no changes required. current smoker--2ppd quit using drugs in 1995 no etoh use single 1 child Packs/Day:  1ppd  Review of Systems      See HPI       The patient complains of anorexia, dyspnea on exertion, abdominal pain, and muscle weakness.  The patient  denies fever, weight loss, weight gain, vision loss, decreased hearing, hoarseness, chest pain, syncope, peripheral edema, prolonged cough, headaches, hemoptysis, melena, hematochezia, severe indigestion/heartburn, hematuria, incontinence, suspicious skin lesions, transient blindness, difficulty walking, depression, unusual weight change, abnormal bleeding, enlarged lymph nodes, and angioedema.    Vital Signs:  Patient profile:   50 year old male Height:      71 inches Weight:      141 pounds BMI:     19.74 O2 Sat:      100 % on Room air Temp:     97.5 degrees F oral Pulse rate:   75 / minute BP sitting:  140 / 68  (right arm) Cuff size:   regular  Vitals Entered By: [name] CMA (May 01, 2010 9:25 AM)  O2 Sat at Rest %:  100 O2 Flow:  Room air CC: 6 week ROV & add-on for stomach pains... Is Patient Diabetic? No Pain Assessment Patient in pain? yes      Onset of pain  stomach pain x 1 month   Physical Exam  Additional Exam:  WD, WN, 50 y/o WM who appears chronically ill... GENERAL:  Alert & oriented; pleasant & cooperative... HEENT:  [place]/AT, EOM-wnl, PERRLA, EACs-clear, TMs-wnl, NOSE-clear, THROAT-clear & wnl. NECK:  Supple w/ fairROM; no JVD; normal carotid impulses w/o bruits; no thyromegaly or nodules palpated; no lymphadenopathy. CHEST:  Clear to P & A; without wheezes/ rales/ or rhonchi heard. HEART:  Regular Rhythm; gr 1/6 SEM, w/o rubs or gallops detected. ABDOMEN:  Soft & nontender; incr bowel sounds; no organomegaly or masses palpated. EXT: without deformities, mild arthritic changes; no varicose veins/ venous insuffic/ or edema, no asterixis. NEURO:  CN's intact; no focal neuro deficits... DERM: Pale complexion, no lesions noted; mult tatoos...    Impression & Recommendations:  Problem # 1:  DIARRHEA, ACUTE (ICD-787.91) ? etiology... it seems to him to be related to his dialysis & he is asked to mention this to the nephrologists as well... in the meanwhile  he has f/u DrWakefield & we will arrange for f/u w/ DrPerry... try Bentyl for the abd pain, and Fiber for the diarrhea itself...  Problem # 2:  Hx of ASTHMATIC BRONCHITIS, ACUTE (ICD-466.0) We discussed the importance of smoking cessation, as we have w/ every OV going back at least 100 yrs but he's still not listening & has no intention of quitting...  Problem # 3:  HYPERTENSION (ICD-401.9) Now controlled on dialysis by Nephrology...  Problem # 4:  HYPERCHOLESTEROLEMIA (ICD-272.0) Remains on Simva20 per Nephrology... His updated medication list for this problem includes:    Simvastatin 20 Mg Tabs (Simvastatin) .Marland Kitchen... Take 1 tab by mouth at bedtime  Problem # 5:  HYPOTHYROIDISM, BORDERLINE (ICD-244.9) We discussed f/u labs on return...  Problem # 6:  HEPATITIS C (ICD-070.51) Formerly followed in the multispec clinic, now monitored by GI...  Problem # 7:  RENAL FAILURE, END STAGE (ICD-585.6) Now on dialysis...  Problem # 8:  OTHER MEDICAL PROBLEMS AS NOTED>>>  Complete Medication List: 1)  Simvastatin 20 Mg Tabs (Simvastatin) .... Take 1 tab by mouth at bedtime 2)  Protonix 40 Mg Tbec (Pantoprazole sodium) .Marland Kitchen.. 1 two times a day 3)  Calcium Acetate 667 Mg Caps (Calcium acetate (phos binder)) .Marland Kitchen.. 1 three times a day 4)  Nephro-vite 0.8 Mg Tabs (B complex-c-folic acid) .Marland Kitchen.. 1 at bedtime 5)  Carbatrol 300 Mg Cp12 (Carbamazepine) .... Take 2 capsules by mouth two times a day 6)  Lexapro 20 Mg Tabs (Escitalopram oxalate) .... Take 1 tab by mouth once daily.Marland KitchenMarland Kitchen 7)  Diazepam 10 Mg Tabs (Diazepam) .... Take 1 tab by mouth three times a day as needed for nerves... 8)  Aranesp (albumin Free) 200 Mcg/0.28ml Soln (Darbepoetin alfa-polysorbate) .... Per nephrology.Marland KitchenMarland Kitchen 9)  Venofer 20 Mg/ml Soln (Iron sucrose) .... Per nephrology... 10)  Dicyclomine Hcl 20 Mg Tabs (Dicyclomine hcl) .... Take 1 tab by mouth three times a day as needed for abd cramping...  Other Orders: Gastroenterology Referral  (GI)  Patient Instructions: 1)  Today we updated your med list- see below.... 2)  We wrote a new perscription for Generic Bentyl (DICYCLOMINE)  to take up to three times daily for abd cramps... 3)  We also refilled your Valium 10mg  to use up to 3 times daily for nerves> this is the MAX dose of this med. 4)  Let's plan a follow up visit in 6 months w/ FASTING blood work at that time... Prescriptions: DICYCLOMINE HCL 20 MG TABS (DICYCLOMINE HCL) take 1 tab by mouth three times a day as needed for abd cramping...  #90 x 5   Entered and Authorized by:   Michele Mcalpine MD   Signed by:   Michele Mcalpine MD on 05/01/2010   Method used:   Print then Give to Patient   RxID:   [id number] DIAZEPAM 10 MG TABS (DIAZEPAM) take 1 tab by mouth three times a day as needed for nerves...  #90 x 5   Entered and Authorized by:   Michele Mcalpine MD   Signed by:   Michele Mcalpine MD on 05/01/2010   Method used:   Print then Give to Patient   RxID:   [id number]

## 2010-12-02 NOTE — Progress Notes (Signed)
Summary: RX  Phone Note Call from Patient Call back at Home Phone [phone]   Caller: Patient Call For: NADEL Reason for Call: Talk to Nurse Summary of Call: [facility] SAYS THAT RX FOR LEXIPRO SHOULD COME FROM PRIMARY DOCTOR NEED RX CALLED INTO [facility] - [phone] CHART ORDERED Initial call taken by: Eugene Gavia,  October 12, 2007 10:13 AM  Follow-up for Phone Call        Spoke with Dr. Kriste Basque and he approved this rx for lexapro 10mg  once daily.  It was called in to pharmacy.  Pt is aware. Follow-up by: Vernie Murders,  October 12, 2007 2:27 PM    New/Updated Medications: LEXAPRO 10 MG  TABS (ESCITALOPRAM OXALATE) once daily   Prescriptions: LEXAPRO 10 MG  TABS (ESCITALOPRAM OXALATE) once daily  #30 x 5   Entered by:   Vernie Murders   Authorized by:   Michele Mcalpine MD   Signed by:   Vernie Murders on 10/12/2007   Method used:   Telephoned to ...       [facility]       [address]       [place], [place]  [id number]       Ph: [phone]       Fax: [phone]   RxID:   [id number]

## 2010-12-04 ENCOUNTER — Telehealth: Payer: Self-pay | Admitting: Internal Medicine

## 2010-12-04 NOTE — Progress Notes (Signed)
Summary: Breathing problem   Phone Note Call from Patient Call back at [phone]   Caller: Ex-wife Rosa Complaint: Breathing Problems Summary of Call: For last 2 months patient has been very sick, and at 6:00 AM he called ex-wife and said he could not breathe.  His nose had been bleeding for about a month, now every day.  Also has chronic congestion.  Patient is now at ex-wife's home.  (His own home has black mold.) Initial call taken by: Leonette Monarch,  October 31, 2010 9:48 AM  Follow-up for Phone Call        [place], spoke with [place].  States pt called her this am bc "he could not breath."  She picked him up and took him to her house.  He is breathing better now.  She is concerned bc pt's house has 1 room that is "covered in black mold."  Would like to know if this could be the reason pt has been feeling so bad and if pt should be checked out for this or given meds. ** He was given z pak yesterday at diaylsis but cannot get it filled until Jan 3rd d/t finincial situation.  Pt also forgot to mention at OV on 10/29/10 with SN that he is having nose bleeds everyday x 1 wk.  Requesting SN's recs on this.    nkda - verified [facility]  Dr. Kriste Basque, pls advise. Thanks! Follow-up by: Gweneth Dimitri RN,  October 31, 2010 10:10 AM  Additional Follow-up for Phone Call Additional follow up Details #1::        per SN: okay to take the zpak.  may use saline nasal spray every 1-2 hours.  needs to avoid black mold - no med rx for this.  may need ENT eval if this persists.  LMOM TCB x1. Boone Master CNA/MA  October 31, 2010 12:50 PM     Additional Follow-up for Phone Call Additional follow up Details #2::    spoke with pt sig other and advised of recs.pt already has a zpak at pharmacy and will pick this up today. [name] CMA  October 31, 2010 2:59 PM

## 2010-12-04 NOTE — Miscellaneous (Signed)
Summary: Hospital admission  INTERNAL MEDICINE ADMISSION HISTORY AND PHYSICAL Attending: Dr. Onalee Hua First Contact: Dr. Cathey Endow [phone] Second Contact: Dr. Sherryll Burger [phone] PCP: None CC: Wide complex tachycardia  HPI: This is a 50  year old male with a past medical history significant for ESRD on dialysis since 4/11, hypertension and hepatitis C who presents from his dialysis center because of a wide complex tachycardia (HR 127).  The even occured during dialysis and pt was reportedly asymptomatic during the event.  Of note, the patient was admitted to [facility] in September for volume overload and hypertensive urgency that resulted in pulmonary edema and VDRF after missing dialysis.  Pt reports that he was "shocked" during that admission but there is no clear record of this. Pt did have atleast one episode of ventricular tachycardia during that hospitalization which was felt to be the result of his volume overload.  Since that hospitalization, the pt endorses palpitations associated with tennitus and dizziness/presyncope that occur about once daily.  These have acutely worsened over the last three days and he reports that they now occur several times daily.  The last of which occured at the time his wide complex tachycardia was discovered.  The pt has also had associated nausea but no vomiting.  Currently, the pt denies any chest pain though he has had occasional paroxysmal chest tightness without alleviating/exacerbating factors since his hospitalization.  The pt denies any recent weight change, fevers, chills, SOB, or change in his BM's. The pt is however, complaining of a "tooth abscess" for the last three days.   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 CALCIUM ACETATE 667 MG CAPS (CALCIUM ACETATE (PHOS BINDER)) 1 three times a day NEPHRO-VITE 0.8 MG TABS (B COMPLEX-C-FOLIC ACID) 1 at bedtime CARBATROL 300 MG  CP12 (CARBAMAZEPINE) Take 2 capsules by mouth two times a day LEXAPRO 20 MG TABS (ESCITALOPRAM OXALATE) take 1 tab by mouth once daily... Percoset 5-325 Take three tabs daily as needed for pain.  DIAZEPAM 10 MG TABS (DIAZEPAM) take 1 tab by mouth three times a day as needed for nerves... ARANESP (ALBUMIN FREE) 200 MCG/0.4ML SOLN (DARBEPOETIN ALFA-POLYSORBATE) per Nephrology... VENOFER 20 MG/ML SOLN (IRON SUCROSE) per Nephrology... TUMS 500 MG CHEW (CALCIUM CARBONATE ANTACID) as needed   SOCIAL HISTORY: current smoker--1ppd since age 23.  quit using drugs in 1995 no etoh use single 1 child   FAMILY HISTORY mother deceased age 17 from copd and also had CHF.  4 siblings are all healthy.   ROS: Negative as per HPI.   VITALS: P: 82  BP:150/87 O2SAT:  100% ON:2L PHYSICAL EXAM: General:  alert, well-developed, and cooperative to examination.   Head:  normocephalic and atraumatic.   Eyes:  vision grossly intact, pupils equal, pupils round, pupils reactive to light, no injection and anicteric.   Mouth:  pharynx pink and moist, no erythema, and no exudates.  Very poor dentition.  Neck:  supple, full ROM, no thyromegaly, no JVD, and no carotid bruits.   Lungs:  normal respiratory effort, no accessory muscle use,  normal breath sounds, no crackles, and no wheezes.  Heart:  normal rate, regular rhythm, no murmur, no gallop, and no rub.   Abdomen:  soft, non-tender, normal bowel sounds, no distention, no guarding, no rebound tenderness, no hepatomegaly, and no splenomegaly.   Msk:  no joint swelling, no joint warmth, and no redness over joints.   Pulses:  2+ DP/PT pulses bilaterally Extremities:  No cyanosis, clubbing, edema, Pt has a left sided graft for dialysis.  Neurologic:  alert & oriented X3, cranial nerves II-XII intact, strength normal in all extremities, sensation intact to light touch, and gait normal.   Skin:  turgor normal and no rashes.   Psych:  normally interactive, good eye contact, not anxious appearing, and not depressed  appearing.  LABS/IMAGING: None   ASSESSMENT AND PLAN: This is a 50 year old male with ESRD who presents with a wide complex tachycardia.   -Wide complex tachycardia:  Most likely VTAC, will admit to SDU for closer evaluation and continue telemetry monitoring. Etiology is unclear at this time, but will check electrolytes including Mag, phos and BMET.  Will also check TSH.  Will cycle cardiac enzymes x 3 Q8H and obtain a 12 lead EKG. We have consulted cardiology at this point and pt will likely need EP evaluation.  Will place defibrilation pads.   -ESRD: Pt receives dialysis on Tuesday, Thursday, and Saturday.  Renal is aware we will try to complete dialysis session tonight.    -?Tooth Abscess: Pt states that he has increased tooth pain for the last three days which feels like past episodes of tooth abscesses.  Pt has not seen a dentist recently because he does not have the money to do so. It is unclear if the patients arrhythmia is related at this point in time so we started broad spectrum antibiotics tonight (zosyn) with a plan to de-escalate tomorrow.  Will check orthopantogram to evaluate and consult dentistry in the am for further management.   -Hepatitis C:This is  followed previously by pts PCP Dr. Hillary Bow and pt was treated with interferon and ribavirin but failed.  Will check a Hep C. viral load.   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  Pt is currently on tegratol.  Pt denies any recent seizures, will continue to monitor.   -Depression: Continue Lexapro.  -VTE PROPH: lovenox  Attending Physician: I have seen and examined the patient. I reviewed the resident/fellow note and agree with the findings and plan of care as documented. My additions and revisions are included.

## 2010-12-04 NOTE — Assessment & Plan Note (Signed)
Summary: 6 months/apc   CC:  6 month ROV & post hosp check....  History of Present Illness: 50 y/o WM here for a follow up visit... he has multiple medical problems as noted below including Chronic HepC prev [facility], & Chronic renal failure now on Dialysis from DrGoldsborough et al...   ~  Jun10:  BP meds adjusted by DrGoldsborough, and she started Simvastatin as well for his Chol... still smoking 1ppd & can't vs won't quit... HepC clinic has been following his TSH and he reports "it's leveled out &  I don't need medication"... he is here today primarily to get a perscription for his Percocet10's- taking 1 Bid...  ~  Dec10:  he has had another eventful 43mo- he is now off the [facility] therapy as it was not helpful w/ viral titers not responding... [facility] told him there may be some new treatment avail in 2011 & he is holding hope but they have not sched any follow up w/ him... he remains in the Nephrology clinic w/ DrGoldsborough every 69mo- and his Creat has risen from 2.2.5 to 3.6 over the last 5 months... they discussed dialysis at their last visit... he tells me that he is now under the care of a Neurologist in [place] for his seizure disorder & HA's... still smoking 1ppd- denies resp problems, and CXR 11/10 showed chr changes, NAD... 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 he states nerves & depression are doing satis on LEXAPRO 20mg /d & Valium 10mg Bid... he has lost weight down to 148# & prognosis is guarded at best.   ~  Mar 12, 2010:  he was hosp 4/26 - 03/04/10 w/ severe anemia, progression to end stage renal disease & started on dialysis.Marland Kitchen. off BP med now due to low BP, and fluid status regulated thru dialysis center... also gets Aranesp & Venofer for anemia (+4u Tx in hosp) from the Nephrologists... GI eval w/ EGD showing enlarged gastric fold, no varices, no  bleeding sites; Colonoscopy showed divertics, hems, & mult adenomatous polyps removed (f/u rec 86yr), stool for occult blood was neg... still smoking 1ppd and can't vs won't quit, he refuses smoking cessation help or Chantix Rx...  he has an abd wall hernia which he claims is quite painful & for which he was give Percocet10 in the hosp w/ consult from DrWyatt, CCS w/ outpt surg pending...  he tells me that he finished the Methadone program 3/11 & has been off all narcs until this hosp...  here for refill of Percocet10 & wants to switch Valium10 for Xanax1mg .   ~  May 01, 2010:  add-on for "stomach problem" meaning diarrhea he says & unable to finish dialysis due to diarrhea... notes this all started after his hernia repair in 04/02/10- lap ventral hernia repair w/ mesh & lysis of adhesions by DrWakefield... hx remote Nissen & prev HPylori Rx... he had EGD & Colon recently by GI- Arlyce Dice & Marina Goodell- see above... we discussed poss IBS & Rx w/ Bentyl + fiber w/ GI follow up recommended...  Also c/o "need more Valium" on 5mg  Tid for nerves & he wants 10mg Tid due to dialysis- I told him this was the max.   ~  October 29, 2010:  he has been skipping his dialysis freq since he claims it causes severe HAs (averages 1-2 per week) & he was hosp 9/11 w/ VDRF  due to vol overload when he missed dialysis in Sep... he claims that Nephrology won't help him w/ the HA problem (although DrPatel & DrWebb gave him Oxycodone in Nov & Dec); and the [facility] group [facility]) refused to see him for the HA prob (they see him for his seizure hx)... we provide him w/ Valium 10mg - 3 per day, and narcotic pain med> prev Vicodin now Norco 10/325- 3 per day... we will refer him to a [place] HA management clinic for further eval & Rx...  still smoking 1ppd & has no interest in smoking cessation help;  BP stable & managed by Dialysis;  Chol Rx w/ Simva20 per DrGoldsborough;  GI stable w/ known HepC, uses OTC PPI Prn;  due for f/u labs  today...    Current Problems:   Hx of SINUSITIS (ICD-473.9) - ENT eval DrShoemaker 2/09 was neg... no complaints or congestion/ drainage/ etc...  CIGARETTE SMOKER (ICD-305.1) - still smoking >1ppd... he knows that he must quit smoking... discussed smoking cessation strategies including cessation programs, counselling, nicotine replacement, and Chantix receptor blockade... the pt is not interested at this time but we left the door open should she like to reconsider at any time.  Hx of ASTHMATIC BRONCHITIS, ACUTE (ICD-466.0) - no recent AB exacerbations...  ~  CXR 11/10 showed chronic changes but NAD.Marland Kitchen.  ~  CXR 4/11 showed chr bronchitic markings, NAD...  HYPERTENSION (ICD-401.9) - prev controlled on combination of Norvasc10, & Furosemide40... off all BP meds since 5/11 discharge & on dialysis now... BP=142/64 today, he is weak, pale, & chr ill appearing...  HYPERCHOLESTEROLEMIA (ICD-272.0) - started on SIMVASTATIN 20mg /d by DrGoldsborough... ?labs by Nephrology.  ~  FLP here 12/11 on Simva20 =   HYPOTHYROIDISM, BORDERLINE (ICD-244.9) - labs prev checked frequently from the [facility] showed sl elevated TSH in the 5-6 range... he was tried on LEVOTHYROID 16mcg/d but states he developed HA & was INTOL therefore stopped after only a few doses...  ~  labs 1/10 here showed TSH= 4.19, FreeT3= 2.2 (2.3-4.2), & FreeT4= 0.4 (0.6-1.6)... Levothy50 started.  ~  labs 3/10 from [facility] showed TSH= 6.57... rec> re-start Levothy50- 1/2 daily.  ~  pt states that Levothy was stopped in the interim- TSH 11/10 off med= 4.49  ~  labs 12/11 here showed TSH=   GERD (ICD-530.81) - Hx of prev Nissen fundoplcation... prev HPylori Rx.  ~  3/10:  given Zofran for nausea by the [facility]...  ~  EGD 5/11 by DrKaplan showed enlarged gastric folds, no varices, no bleeding sites... on PROTONIX 40mg Bid at disch from hosp, but he stopped on his own...  DIVERTICULOSIS OF COLON (ICD-562.10) & COLONIC POLYPS  (ICD-211.3)  ~  Colonoscopy 5/11 DrPerry showed divertics, hems, 7 polypys= adenomatous, no bleeding sites... f/u rec 38yr w/ DrPatterson.  HEPATITIS C (ICD-070.51) - Hx of prev IV drug abuse, and mult tatoos... he has known HepC w/ mild cirrhosis and chr active hepatitis on liver Bx 8/08...reviewed by DrPatterson for GI 8/08 and referred to the [facility]... he began PEGASYS & RIBAVIRIN 12/07/08 via the clinic and he was followed weekly>  ~  last clinic note of 04/09/09 reviewed- viral titer increased slightly (may be developing resistance).  ~  he was taken off therapy 7/10- rising viral titers, no option for other Rx at this time... he indicates that they were hopeful that some new Rx would be avail in 2011...  RENAL FAILURE, END STAGE (ICD-585.6) - found to have nephrotic range  proteinuria, microscopic hematuria, and Creat= 1.45 w/ eval by DrGoldsborough for Nephrology including a renal biopsy showing Fibrillary GN, mod interstitial fibrosis, and tubular atrophy- stage 3 chronic kidney disease (no spec therapy avail- Rx for his HepC may help)... since then he has progressed to ESRD & started on dialysis.  ~  labs from [facility] 3/10 showed BUN= 35, Creat= 2.24, K= 5.9.Marland Kitchen. he was started on LASIX by Nephrology.  ~  note from [facility] 03/12/09 reviewed- stable RI, BP meds adjusted.  ~  labs from [facility] 6/10 showed Creat= 2.18, K= 4.8, HCO3= 14  ~  labs 11/10 showed BUN= 33, Creat= 3.6, K=4.1, HCO3= 23  ~  labs from 5/11 hosp showed BUN=53, Creat=5.21, K= 5.0, HCO3=16, Ca=7.2, Alb=2.0, PTH=339  ~  5/11:  started on dialysis by Nephrology & they do his labs now...  Hx of SEIZURE DISORDER (ICD-780.39) - as noted- prev [facility] at [facility] by DrO'Donovan and is taking CARBITROL 600mg Bid... he is on disability because of his seizure history... now followed at Neurology office in [place].  HEADACHE, MIXED (ICD-784.0) - he was referred to the [facility] after his appt 3/09 w/ severe HA he  thought were "clusters"... pt never showed for the appt--- DrGoldsborough gave him Vicodin to use Prn (she doesn't want him on NSAIDs etc)...  ~  6/10: he requests Percocet10 one Bid as nothing else helps his pain... I told him we would fill #60, no early refills, no other narcotics from anyone else or we will insist on his seeking help from a chr pain clinic for management...  ~  12/10: he is on a chr METHADONE program via [facility] where he has to go every day for his dose.  ~  5/11: he tells me that he finished the methadone clinic program & was off narcotics until 5/11 hosp w/ pain from abd wall hernia.  ~  12/11:  now c/o severe HAs that he blames on his dialysis- freq skips treatment & ave 1/wk; he claims the [facility] in [place] will not see him for the HA prob; we fill rx for [facility] 10/325 Tid & will refer him to the HA management clinic for assessment.  ANXIETY (ICD-300.00) - he has mod anxiety and takes VALIUM 10mg Tid (max allowed dose). DEPRESSION (ICD-311) - on LEXAPRO 20mg /d which he feels is helping. INSOMNIA - already on Valium as noted.  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   Preventive Screening-Counseling & Management  Alcohol-Tobacco     Smoking Status: current     Packs/Day: 1ppd  Allergies (verified): No Known Drug Allergies  Comments:  Nurse/Medical Assistant: The patient's medications and allergies were reviewed with the patient and were updated in the Medication and Allergy Lists.  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 Inguinal Hernia (1973) Lt. Orchidopexy (1973) Nissen Fundoplication (1999) Left temporal craniotomy/cranioplasty; skull fracture (1978) Left arm AV fistula 5/11 DrEarly  Family History: Reviewed history from 05/01/2010 and  no changes required. mother deceased age 65 from copd 1 sibling alive age 34  hx of copd 1 sibling alive age 15 in good health 1 sibling alive age 24 in good health 1 sibling alive age 43 in good health  Social History: Reviewed history from 05/01/2010 and no changes required. current smoker--2ppd quit using drugs in 1995 no etoh use single 1 child  Review of Systems      See HPI       The patient complains of weight loss, decreased hearing, dyspnea on exertion, headaches, and muscle weakness.  The patient denies anorexia, fever, weight gain, vision loss, hoarseness, chest pain, syncope, peripheral edema, prolonged cough, hemoptysis, abdominal pain, melena, hematochezia, severe indigestion/heartburn, hematuria, incontinence, suspicious skin lesions, transient blindness, difficulty walking, depression, unusual weight change, abnormal bleeding, enlarged lymph nodes, and angioedema.    Vital Signs:  Patient profile:   50 year old male Height:      71 inches Weight:      131.13 pounds BMI:     18.36 O2 Sat:      98 % on Room air Temp:     98.2 degrees F oral Pulse rate:   73 / minute BP sitting:   142 / 64  (right arm) Cuff size:   regular  Vitals Entered By: [name] CMA (October 29, 2010 10:10 AM)  O2 Sat at Rest %:  98 O2 Flow:  Room air CC: 6 month ROV & post hosp check... Is Patient Diabetic? No Pain Assessment Patient  in pain? yes      Onset of pain  headaches all the time with the dialysis Comments meds updated today with pt   Physical Exam  Additional Exam:  Thin, pale, 50 y/o WM who appears chronically ill... GENERAL:  Alert & oriented; pleasant & cooperative... teeth in poor repair... HEENT:  [place]/AT, EOM-wnl, PERRLA, EACs-clear, TMs-wnl, NOSE-clear, THROAT-clear & wnl. NECK:  Supple w/ fairROM; no JVD; normal carotid impulses w/o bruits; no thyromegaly or nodules palpated; no lymphadenopathy. CHEST:  Clear to P & A; without wheezes/ rales/ or rhonchi heard. HEART:  Regular Rhythm; gr 1/6 SEM, w/o rubs or gallops detected. ABDOMEN:  Soft & nontender; incr bowel sounds; no organomegaly or masses palpated. EXT: without deformities, mild arthritic changes; no varicose veins/ venous insuffic/ or edema, no asterixis. NEURO:  CN's intact; no focal neuro deficits... DERM: Pale complexion, no lesions noted; mult tatoos...    MISC. Report  Procedure date:  10/29/2010  Findings:      Data Reviewed:  ~  [facility] records from 9/11>  H&P, DCSummary, XRays, Labs, etc...  ~  Labs drawn today & pending> see cumulative lab summary sheet...   Impression & Recommendations:  Problem # 1:  Hx of ASTHMATIC BRONCHITIS, ACUTE (ICD-466.0) He has COPD, AB, continued cig smoking>  advised to quit all smoking again & offered Chantix etc but he declines all smoking cessation help... breathing at baseline & doing satis w/ fluid manage emnt via dialysis...  Problem # 2:  HYPERTENSION (ICD-401.9) BP controlled via dialysis management... Orders: TLB-BMP (Basic Metabolic Panel-BMET) (80048-METABOL) TLB-Hepatic/Liver Function Pnl (80076-HEPATIC) TLB-CBC Platelet - w/Differential (85025-CBCD) TLB-TSH (Thyroid Stimulating Hormone) (84443-TSH) TLB-PSA (Prostate Specific Antigen) (84153-PSA)  Problem # 3:  HYPERCHOLESTEROLEMIA (ICD-272.0) Follow up FLP pending on the Simva20... His updated medication list for this problem  includes:    Simvastatin 20 Mg Tabs (Simvastatin) .Marland Kitchen... Take 1 tab by mouth at bedtime  Problem # 4:  HYPOTHYROIDISM, BORDERLINE (ICD-244.9) Follow up  TFTs pending> he is not on Thyroid meds claiming they caused HAs in the past...  Problem # 5:  GERD (ICD-530.81) Stable on meds below... The following medications were removed from the medication list:    Protonix 40 Mg Tbec (Pantoprazole sodium) .Marland Kitchen... 1 two times a day His updated medication list for this problem includes:    Dicyclomine Hcl 20 Mg Tabs (Dicyclomine hcl) .Marland Kitchen... Take 1 tab by mouth three times a day as needed for abd cramping...    Tums 500 Mg Chew (Calcium carbonate antacid) .Marland Kitchen... As needed  Problem # 6:  HEPATITIS C (ICD-070.51) Prev sent by DrPatterson to the [facility] but he was refractory to the avail HepC therapies...  Problem # 7:  RENAL FAILURE, END STAGE (ICD-585.6) Now on dialysis supposed to be 3d per week but only ave 1-2 per week due to HAs... advised to get back on 3d/wk regimen & take up any ongoing dialysis issues w/ Nephrology...  Problem # 8:  HEADACHE, MIXED (ICD-784.0) Hx seizure disorder controlled on Carbitrol by Neurology but he claims they won't help him w/ his HA issues... therefore refer to [facility] for eval... for now he is getting [facility] 10/325 three times a day from me... His updated medication list for this problem includes:    Norco 10-325 Mg Tabs (Hydrocodone-acetaminophen) .Marland Kitchen... Take 1 tab by mouth three times a day as needed for headache pain...  Orders: Headache Clinic Referral (Headache)  Problem # 9:  ANXIETY (ICD-300.00) He has Anxiety/ Depression>  on Lexapro 20mg /d, and he insists on Valium 10mg  Tid... His updated medication list for this problem includes:    Lexapro 20 Mg Tabs (Escitalopram oxalate) .Marland Kitchen... Take 1 tab by mouth once daily...    Diazepam 10 Mg Tabs (Diazepam) .Marland Kitchen... Take 1 tab by mouth three times a day as needed for nerves...  Complete Medication  List: 1)  Simvastatin 20 Mg Tabs (Simvastatin) .... Take 1 tab by mouth at bedtime 2)  Dicyclomine Hcl 20 Mg Tabs (Dicyclomine hcl) .... Take 1 tab by mouth three times a day as needed for abd cramping... 3)  Calcium Acetate 667 Mg Caps (Calcium acetate (phos binder)) .Marland Kitchen.. 1 three times a day 4)  Nephro-vite 0.8 Mg Tabs (B complex-c-folic acid) .Marland Kitchen.. 1 at bedtime 5)  Carbatrol 300 Mg Cp12 (Carbamazepine) .... Take 2 capsules by mouth two times a day 6)  Lexapro 20 Mg Tabs (Escitalopram oxalate) .... Take 1 tab by mouth once daily.Marland KitchenMarland Kitchen 7)  Norco 10-325 Mg Tabs (Hydrocodone-acetaminophen) .... Take 1 tab by mouth three times a day as needed for headache pain.Marland KitchenMarland Kitchen 8)  Diazepam 10 Mg Tabs (Diazepam) .... Take 1 tab by mouth three times a day as needed for nerves.Marland KitchenMarland Kitchen 9)  Aranesp (albumin Free) 200 Mcg/0.32ml Soln (Darbepoetin alfa-polysorbate) .... Per nephrology... 10)  Venofer 20 Mg/ml Soln (Iron sucrose) .... Per nephrology... 11)  Tums 500 Mg Chew (Calcium carbonate antacid) .... As needed  Patient Instructions: 1)  Today we updated your med list- see below.... 2)  We refilled Valium 10 & Norco 10> you may take up to 3 per day but do not exceed 3/d.Marland KitchenMarland Kitchen  3)  We will arrange for a Headache clinic referral for you.Marland KitchenMarland Kitchen 4)  Today we did your follow up blood work... please call the "phone tree" in a few days for your lab results.Marland KitchenMarland Kitchen 5)  Let's plan a follow up visit in 6 months... Prescriptions: NORCO 10-325 MG TABS (HYDROCODONE-ACETAMINOPHEN) take 1 tab by mouth three times  a day as needed for headache pain...  #90 x 5   Entered and Authorized by:   Michele Mcalpine MD   Signed by:   Michele Mcalpine MD on 10/29/2010   Method used:   Print then Give to Patient   RxID:   [id number] DIAZEPAM 10 MG TABS (DIAZEPAM) take 1 tab by mouth three times a day as needed for nerves...  #90 x 5   Entered and Authorized by:   Michele Mcalpine MD   Signed by:   Michele Mcalpine MD on 10/29/2010   Method used:   Print then Give  to Patient   RxID:   [id number]    Immunization History:  Influenza Immunization History:    Influenza:  historical (08/05/2010)

## 2010-12-08 ENCOUNTER — Telehealth (INDEPENDENT_AMBULATORY_CARE_PROVIDER_SITE_OTHER): Payer: Self-pay | Admitting: *Deleted

## 2010-12-09 ENCOUNTER — Inpatient Hospital Stay: Payer: Self-pay | Admitting: Pulmonary Disease

## 2010-12-10 NOTE — Progress Notes (Addendum)
Summary: need  note faxed to probation officer  Phone Note Call from Patient Call back at [phone]   Caller: Daughter/ Noralee Space Summary of Call: Pt needs a note stating she was in the hospital with her father pt was seen by Dr.Jory Tanguma for her faxed to her probation officer pt was in hosiptal last Thursday,Friday and  Sat. Initial call taken by: Judie Grieve,  December 04, 2010 2:53 PM

## 2010-12-18 NOTE — Discharge Summary (Signed)
Summary: Hospital Discharge Update    Hospital Discharge Update:  Date of Admission: 11/27/2010 Date of Discharge: 12/02/2010  Brief Summary:  This is a 50 year old male with a past meidical hx significant for ESRD, hypertension, and hep C who presented from [facility] beause of sustained asymptomatic VTAC that lasted approximently 45 mins.  Pt was admitted to SDU and evaluated by Cards.  Cath only demonstrated mild irregularities in the coronary arteries and mycar was performed to RO any infilrative process.  Pt was felt to have begnin left vetricular orginating VTAC and he was beta-blocked with metoprolol.  Other follow-up issues:  Pt has very poor dention and needs dentistry follow up.  hep C viral load was 604000. Pt has failed treatment in the past.  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)  Medication list changes:  Added new medication of LOPRESSOR 50 MG TABS (METOPROLOL TARTRATE) Take 1 tablet by mouth two times a day - Signed Rx of LOPRESSOR 50 MG TABS (METOPROLOL TARTRATE) Take 1 tablet by mouth two times a day;  #60 x 3;  Signed;  Entered by: Sinda Du MD;  Authorized by: Sinda Du MD;  Method used: Print then Give to Patient Rx of LOPRESSOR 50 MG TABS (METOPROLOL TARTRATE) Take 1 tablet by mouth two times a day;  #60 x 3;  Signed;  Entered by: Sinda Du MD;  Authorized by: Sinda Du MD;  Method used: Print then Give to Patient  The medication, problem, and allergy lists have been updated.  Please see the dictated discharge summary for details.  Discharge medications:  SIMVASTATIN 20 MG TABS (SIMVASTATIN) take 1 tab by mouth at bedtime DICYCLOMINE HCL 20 MG TABS (DICYCLOMINE HCL) take 1 tab by mouth three times a day as needed for abd cramping... CALCIUM ACETATE 667 MG CAPS (CALCIUM ACETATE (PHOS BINDER)) 1 three times a day NEPHRO-VITE 0.8 MG TABS (B COMPLEX-C-FOLIC  ACID) 1 at bedtime CARBATROL 300 MG  CP12 (CARBAMAZEPINE) Take 2 capsules by mouth two times a day LEXAPRO 20 MG TABS (ESCITALOPRAM OXALATE) take 1 tab by mouth once daily... NORCO 10-325 MG TABS (HYDROCODONE-ACETAMINOPHEN) take 1 tab by mouth three times a day as needed for headache pain... DIAZEPAM 10 MG TABS (DIAZEPAM) take 1 tab by mouth three times a day as needed for nerves... ARANESP (ALBUMIN FREE) 200 MCG/0.4ML SOLN (DARBEPOETIN ALFA-POLYSORBATE) per Nephrology... VENOFER 20 MG/ML SOLN (IRON SUCROSE) per Nephrology... TUMS 500 MG CHEW (CALCIUM CARBONATE ANTACID) as needed LOPRESSOR 50 MG TABS (METOPROLOL TARTRATE) Take 1 tablet by mouth two times a day  Other patient instructions:  Please come for an appointment with Dr. Kriste Basque on 2/11 4pm with Dr. Kriste Basque.   Please follow up with Dr. Tillie Rung of [facility] specalitst on March 1 9:40am with Dr. Tillie Rung. [phone] If you have any palpitations, call the clinic at [phone] or go to the ED.   Please take your medication as prescribed below.  If you have any problem, Please call the clinic.   In case of an emergency  dial 911 or go to the emergency department.   Note: Hospital Discharge Medications & Other Instructions handout was printed, one copy for patient and a second copy to be placed in hospital chart.  Prescriptions: LOPRESSOR 50 MG TABS (METOPROLOL TARTRATE) Take 1 tablet by mouth two times a day  #60 x 3   Entered and Authorized by:   Elige Radon  Laresha Bacorn MD   Signed by:   Sinda Du MD on 12/02/2010   Method used:   Print then Give to Patient   RxID:   [id number] LOPRESSOR 50 MG TABS (METOPROLOL TARTRATE) Take 1 tablet by mouth two times a day  #60 x 3   Entered and Authorized by:   Sinda Du MD   Signed by:   Sinda Du MD on 12/02/2010   Method used:   Print then Give to Patient   RxID:   [id number]

## 2010-12-18 NOTE — Progress Notes (Signed)
Summary: HFU appt w/ nadel  Phone Note Call from Patient Call back at Home Phone [phone]   Caller: Patient Call For: nadel Summary of Call: pt needs another HFU appt. he had to cancel the one scheduled for tomorrow at 4pm as he is in dialysis at that time. needs a mon/ wed or fri appt.  Initial call taken by: [name], CNA,  December 08, 2010 11:39 AM  Follow-up for Phone Call        Spoke with pt and sched HFU for 12/24/10 at 3:30 pm. Follow-up by: Vernie Murders,  December 08, 2010 12:04 PM

## 2010-12-22 NOTE — Consult Note (Signed)
NAME:  Sean [name], Sean [name] NO.:  [id number]  MEDICAL RECORD NO.:  [id number]          PATIENT TYPE:  INP  LOCATION:  2004                         FACILITY:  [facility]  PHYSICIAN:  Duke Salvia, MD, FACCDATE OF BIRTH:  [birth year]  DATE OF CONSULTATION:  11/28/2010 DATE OF DISCHARGE:                                CONSULTATION   Thank you very much for asking [place] to see Mr. Rhys Martini in consultation because of wide complex tachycardia.  The patient is a 50 year old Caucasian gentleman with a history of wide complex tachycardia with which he presented to dialysis yesterday.  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  He had not been feeling great for couple of days.  This is true on Tuesday which is the routine dialysis day.  He reported to dialysis yesterday and was hooked up and was found to be in heart rate of 160. He was transferred subsequently to [facility] via EMS were he was dialyzed here.  His tachycardia broke spontaneously.  It was recorded to persist for over an hour.  It was a right bundle-branch block, northwest axis tachycardia with a cycle length of 380 milliseconds slowing down to about 440 milliseconds.  The patient was unaware of tachy palpitations. He was aware of some heartbeat but it was not nearly as fast as identified.  He has subsequently undergone catheterization demonstrating normal left ventricular function and no obstructive coronary disease.  He has a remote history of syncope.  His past medical history is as noted above.  He also has history of seizure disorder and depression.  His end-stage renal disease with dialysis has been associated with a significant weight loss.  SOCIAL HISTORY:  He is divorced.  He has 2 daughters.  He is disabled. He continues to smoke, use alcohol occasionally, and denies use of ongoing recreational  drugs.  His family history is negative for ventricular tachycardia and glomerulonephritis.  His review of systems is broadly negative apart from what is described previously.  Medications as an outpatient include: 1. Carbamazepine. 2. Lexapro 20. 3. Simvastatin 20. 4. Omeprazole. 5. Trazodone 100. 6. Hydrocodone.  Medications since in hospital include addition of Zosyn.  PHYSICAL EXAMINATION:  GENERAL:  He is a middle-aged Caucasian male appearing considerably older than his stated age. VITAL SIGNS:  His blood pressure is 117/69 with a pulse of 67, respirations were 14, he was afebrile. HEENT:  Demonstrates that he is somewhat emaciated but in no acute distress.  He has poor dentition. NECK:  Neck veins were flat.  Carotids are brisk and full bilaterally without bruits and there are tattoos around his neck. BACK:  Was not \\examined  because he just had a catheterization. LUNGS:  Clear laterally. HEART:  Sounds were regular without murmurs or gallops. ABDOMEN:  Scaphoid without midline pulsation or hepatomegaly. EXTREMITIES:  Femoral pulses were trace.  Distal pulses were intact. There was no clubbing or cyanosis.  __________ dialysis patient. NEUROLOGICAL:  Grossly normal. SKIN:  Warm and dry.  Electrocardiogram following on presentation demonstrated wide  complex tachycardia with a broad monophasic R-wave.  There was a northwest axis. The cycle length as noted was of 440 milliseconds.  It had been somewhat faster than that earlier with the cycle length of 360 milliseconds again with a northwest axis.  There was no concordance across the precordium.  Electrocardiogram dated yesterday afternoon following cardioversion demonstrated sinus rhythm at 60 with intervals of 0.17/0.14/0.45.  There was poor R-wave progression.  There was some ST-segment elevation in the anterior precordium and repolarization abnormalities.  Electrocardiogram today demonstrates a QT interval of 560  milliseconds.  IMPRESSION: 1. Ventricular tachycardia with a right bundle-branch block, northwest     axis morphology. 2. Prolonged QT interval, not on any clearly aggravated drugs except     for trazodone which is chronic. 3. Fibrillary glomerulonephritis. 4. Hepatitis C question contributing to fibrillary glomerulonephritis. 5. Polysubstance abuse.  DISCUSSION:  Mr. Plata has wide complex tachycardia that is almost certainly ventricular in origin.  EP testing may be helpful in trying to clarify the mechanism of this.  Trying to understand why he has it, it was the first order business, however.  I wonder given the systemic nature of fibrillary glomerulonephritis whether there is some other process which is involving his heart as well.  Hepatitis is common that can occur with this autoimmune disease as well as this proteinemia have also been associated making me wonder whether the main appearing infiltrative process.  We will undertake a thallium scan to try to look at this; he is not a candidate for MR given his renal function.  We will also get a signal average ECG looking for infiltrative processes, although it will be a little bit difficult to interpret with mildly broadened QRS.  Therapy wise, we will plan to try him on a beta-blocker and hold amiodarone __________ for right now because his left ventricular function is normal.  It will also give [place] opportunity to undertake EP testing in the event of that.  It is indicated to me will be to clarify the mechanism of the tachycardia, although by electrocardiographic criteria it is almost certainly ventricular in origin.  Thank you for the consultation.     Duke Salvia, MD, [facility]     SCK/MEDQ  D:  11/28/2010  T:  11/29/2010  Job:  [id number]  Electronically Signed by Sherryl Manges MD [facility] on 12/22/2010 09:59:43 PM

## 2010-12-24 ENCOUNTER — Ambulatory Visit: Payer: Self-pay | Admitting: Pulmonary Disease

## 2010-12-24 NOTE — Discharge Summary (Signed)
NAME:  Sean [name], Sean [name] NO.:  [id number]  MEDICAL RECORD NO.:  [id number]          PATIENT TYPE:  INP  LOCATION:  2004                         FACILITY:  [facility]  PHYSICIAN:  Mariea Stable, MD   DATE OF BIRTH:  1961-04-08  DATE OF ADMISSION:  11/27/2010 DATE OF DISCHARGE:  12/02/2010                              DISCHARGE SUMMARY   ATTENDING PHYSICIAN:  Dr. Onalee Hua.  DISCHARGE DIAGNOSES: 1. Wide complex tachycardia. 2. End-stage renal disease. 3. Tooth pain. 4. Hepatitis C. 5. Seizure disorder. 6. Depression. 7. Gastroesophageal reflux disease. 8. Hypertension.  DISCHARGE MEDICATIONS: 1. Simvastatin 20 mg tablets take 1 tablet at bedtime. 2. Dicyclomine 20 mg tabs, take 1 tablet 3 times a day as needed for     cramping. 3. Calcium acetate 667 mg capsules 1 tablet 3 times a day. 4. Nephro-Vite 0.8 mg tabs, take 1 at bedtime. 5. Carbatrol 300 mg CP 12, take 2 capsules by mouth 2 times day. 6. Lexapro 20 mg tabs, take 1 tablet by mouth daily. 7. Norco 10/325 mg tabs, take 1 tablet by mouth 3 times a day as     needed for headache pain. 8. Diazepam 10 mg tabs, take 1 tablet by mouth 3 times a day as needed     for nerves. 9. Aranesp 200 mcg/0.4 mL solution per nephrology. 10.Venofer 20 mg/mL solution per nephrology. 11.Tums 500 mg 2 as needed. 12.Lopressor 50 mg tabs, take 1 tablet by mouth twice daily.  DISPOSITION AND FOLLOWUP:  The patient is to come to an appointment at Outpatient Clinic at [facility] 1-2 weeks.  He will be called for an appointment.  The patient was informed that he should call the clinic at [phone] if he does not receive an appointment within 1 week. The patient was also informed that if he has any palpitations, he should contact the clinic or go to the emergency department.  At that visit whether or not the patient has continued to have palpitations and questionable V-tach should be readdressed.  The patient's heart  rate and blood pressure should also be closely monitored as we did start Lopressor 50 mg b.i.d. in the hospital.  The patient does have a mild anemia and this should continue to be followed.  The patient's hepatitis C viral load was 604,000, he failed treatment multiple times twice in the past but this should continue to be followed as an outpatient.  The patient also has very poor dentition and needs dentistry followup.  PROCEDURES PERFORMED: 1. One-view chest x-ray was performed on November 27, 2010 which     demonstrated no active disease. 2. Orthopantogram was performed on November 27, 2010 which demonstrated     multiple maxillary teeth missing but no evidence of periapical     abscess, no bony abnormality. 3. A myocardial imaging with SPECT was performed which demonstrated no     evidence of infarct or at risk of myocardium on intermediate rest,     so thallium SPECT imaging.  CONSULTATIONS:  Cardiology.  BRIEF ADMITTING HISTORY AND PHYSICAL:  This is a 50 year old male with past  medical history significant for ESRD, hypertension, hep C who presents from dialysis because of wide complex tachycardia.  It occurred during dialysis.  The patient was reportedly asymptomatic during the event.  He has been transferred to [facility] and noted to be in ventricular tachycardia for approximately 45 minutes.  Of note, the patient was admitted to [facility] in September for volume overload and hypertensive urgency which resulted in pulmonary edema and ventilator dependent respiratory failure after missing dialysis.  The patient reports that he was shocked during that admission but there is no clear record of this patient.  The patient did have at least one episode of ventricular tachycardia during that hospitalization which was felt to be the result of his volume overload.  Since that hospitalization, the patient endorsed palpitations associated with tinnitus and dizziness/presyncope  and occur about once daily.  These episodes have acutely worsened over the last 3 days and he reports that they occur several times daily, the last of which occurred at the time of his being in [facility].  The patient has also stated associated nausea but no vomiting.  Currently denies any chest pain but he had occasional paroxysmal chest tightness without alleviating or exacerbating factors since his hospitalization.  ALLERGIES:  No known drug allergies.  PAST MEDICAL HISTORY: 1. Asthmatic bronchitis, acute. 2. Hypertension. 3. Hypercholesterolemia. 4. Borderline hypothyroidism. 5. GERD. 6. Diverticulosis of colon. 7. Colonic polyps. 8. Hepatitis C. 9. History of drug abuse. 10.End-stage renal disease. 11.History of seizure disorder. 12.Headache. 13.Anxiety and depression. 14.Anemia of chronic disease.  PHYSICAL EXAMINATION:  VITAL SIGNS ON ADMISSION:  Pulse 83, blood pressure 150/87, O2 sat 100% on 2 liters by nasal cannula. GENERAL:  Alert, well-developed, cooperative to exam. HEAD:  Normocephalic, atraumatic. EYES:  Vision grossly intact.  Pupils are equal, round, reactive to light. MOUTH:  Oropharynx is pink and moist, no erythema or exudates. NECK:  Supple, full range of motion.  No thyromegaly, no JVD or carotid bruits. LUNGS:  Normal respiratory effort.  No accessory muscle use.  No crackles, wheezes, or rubs. HEART:  Regular rate and rhythm.  No murmurs, gallops, or rubs. ABDOMEN:  Soft, nontender, normal bowel sounds.  No distention or guarding. MUSCULOSKELETAL:  No joint swelling, warmth, or edema. PULSES:  2+, DP/PT pulses bilaterally. EXTREMITIES:  No cyanosis, clubbing, or edema. NEUROLOGIC:  Alert and oriented x3.  Cranial nerves II-XII intact.  LABS ON ADMISSION:  None.  DISPOSITION AND FOLLOWUP:  The patient is to follow up with his primary care physician, Dr. Kriste Basque on February 11 at 4:00 p.m.  At that visit, the patient's palpitations and  ventricular tachycardia should continue to be assessed and the possible need for future ablation should be reconsidered if the patient remained symptomatic.  HOSPITAL COURSE BY PROBLEM: 1. Wide complex tachycardia:  This was felt to be ventricular     tachycardia on admission.  The patient was admitted to step-down     unit for closer evaluation and continued telemetry monitoring.  The     etiology was unclear.  We checked his electrolytes including mag,     phos, and BMET all of which were normal.  TSH was also within     normal limits and cardiac enzymes were negative x3.  We did consult     cardiology who performed a SPECT study in order to rule out any     infiltrative process after the patient went to left heart     catheterization which was found  to demonstrate only mild coronary     irregularities but no significant obstructive lesions.  At this     point, the patient was not felt to have an ischemic cardiomyopathy     and SPECT scan did not reveal any obvious infiltrative process.  As     such the patient was seen by Dr. Johney Frame who felt that this was a     benign ventricular originating V-tach and recommended simple beta-     blockade with metoprolol 50 mg b.i.d.  The option of ablation was     considered.  However, the risk was felt to be too great.  This was     discussed with the patient by Dr. Johney Frame and it was recommended     that he follow up with Dr. Johney Frame in 4 weeks and they could     consider ablation if the patient continues to be symptomatic but     otherwise we would just continue with medical management.  At the     day of discharge, the patient had no further recurrence of his     ventricular tachycardia while on telemetry and was felt to be     stable for discharge but was explained the risks including that of     sudden death of his condition. 2. End-stage renal disease:  The patient received dialysis on Tuesday,     Thursday and Saturday.  Renal was consulted  during this admission     and the patient did receive dialysis on the morning after his     admission and regularly thereafter.  The patient did receive     dialysis directly prior to his discharge with plans to follow up     with his outpatient dialysis center thereafter for continued care. 3. Tooth pain:  The patient was experiencing pain in one of his left     lower molars.  There was question as to whether or not this was an     abscess and the patient was initially started on Zosyn as we did     not know whether or not an infectious etiology could be     precipitating the patient's ventricular tachycardia.  An     orthopantogram was performed which did not demonstrate any abscess.     As such the antibiotic was discontinued and the patient's tooth     pain gradually improved throughout his stay.  The patient, does,     however have very poor dentition and does need followup with     dentistry if that can be arranged.  Unfortunately there have been     financial constraints to this in the past. 4. Hepatitis C:  This was followed by the patient's PCP, Dr. Kriste Basque in     the past and he was treated with interferon and ribavirin which     failed.  We will recheck a hepatitis C viral load which came back     at greater than 604,000.  This has been treated multiple times in     the past but the patient has failed treatment.  This should     continue to be followed on an outpatient basis.  LABORATORY DATA ON DISCHARGE:  Prior to dialysis sodium 127, potassium 5.2, chloride 91, bicarb 23, glucose 94, BUN 53, creatinine 7.29, white blood cell count 9.0, hemoglobin 11.2, hematocrit 33.1, platelet count was 171,000.  DISCHARGE VITAL SIGNS:  On the day of discharge, the patient's  temperature was 98.1, blood pressure 164/95, pulse of 60, respirations 20, satting 100% on room air.     Sinda Du, MD   ______________________________ Mariea Stable, MD    BB/MEDQ  D:  12/02/2010   T:  12/03/2010  Job:  [id number]  cc:   [facility]  Electronically Signed by Sinda Du MD on 12/24/2010 01:27:33 PM Electronically Signed by Mariea Stable MD on 12/24/2010 04:04:36 PM

## 2010-12-26 ENCOUNTER — Inpatient Hospital Stay: Payer: Medicare Other | Admitting: Pulmonary Disease

## 2010-12-30 NOTE — Progress Notes (Signed)
Summary: pt's dtr calling back with fax number  Phone Note Call from Patient   Caller: Daughter christy Reason for Call: Talk to Nurse Summary of Call: pt's dtr calling back with the fax number for the note to her probation officer fax [phone] Initial call taken by: Glynda Jaeger,  December 04, 2010 3:01 PM  Follow-up for Phone Call        noted Follow-up by: Nathen May, MD, [facility],  December 22, 2010 8:38 PM

## 2011-01-01 ENCOUNTER — Encounter: Payer: Self-pay | Admitting: Internal Medicine

## 2011-01-02 ENCOUNTER — Encounter (INDEPENDENT_AMBULATORY_CARE_PROVIDER_SITE_OTHER): Payer: Self-pay | Admitting: *Deleted

## 2011-01-08 NOTE — Letter (Signed)
Summary: Appointment - Missed  [facility], Main Office  [address]   [place], [place] [address]   Phone: [phone]  Fax: [phone]     January 02, 2011 MRN: [id number]   [facility] [address] [place], [place]  [address]   Dear Mr. Sean [name],  Our records indicate you missed your appointment on  01-01-11  with Dr. Johney Frame .                                    It is very important that we reach you to reschedule this appointment. We look forward to participating in your health care needs. Please contact us at the number listed above at your earliest convenience to reschedule this appointment.     Sincerely,    Glass blower/designer

## 2011-01-15 LAB — RENAL FUNCTION PANEL
Albumin: 3 g/dL — ABNORMAL LOW (ref 3.5–5.2)
BUN: 35 mg/dL — ABNORMAL HIGH (ref 6–23)
Calcium: 8.8 mg/dL (ref 8.4–10.5)
Creatinine, Ser: 5.43 mg/dL — ABNORMAL HIGH (ref 0.4–1.5)
Glucose, Bld: 90 mg/dL (ref 70–99)
Phosphorus: 6 mg/dL — ABNORMAL HIGH (ref 2.3–4.6)
Potassium: 5.7 mEq/L — ABNORMAL HIGH (ref 3.5–5.1)

## 2011-01-15 LAB — CULTURE, BLOOD (ROUTINE X 2)

## 2011-01-15 LAB — PHOSPHORUS: Phosphorus: 6 mg/dL — ABNORMAL HIGH (ref 2.3–4.6)

## 2011-01-15 LAB — CARDIAC PANEL(CRET KIN+CKTOT+MB+TROPI)
Relative Index: INVALID (ref 0.0–2.5)
Total CK: 60 U/L (ref 7–232)
Troponin I: 0.3 ng/mL — ABNORMAL HIGH (ref 0.00–0.06)
Troponin I: 0.63 ng/mL (ref 0.00–0.06)

## 2011-01-15 LAB — CBC
MCH: 29.6 pg (ref 26.0–34.0)
MCH: 30 pg (ref 26.0–34.0)
MCV: 91 fL (ref 78.0–100.0)
Platelets: 181 10*3/uL (ref 150–400)
Platelets: 241 10*3/uL (ref 150–400)
RBC: 4.19 MIL/uL — ABNORMAL LOW (ref 4.22–5.81)
RDW: 23.6 % — ABNORMAL HIGH (ref 11.5–15.5)
RDW: 23.9 % — ABNORMAL HIGH (ref 11.5–15.5)
WBC: 10.4 10*3/uL (ref 4.0–10.5)
WBC: 20.4 10*3/uL — ABNORMAL HIGH (ref 4.0–10.5)

## 2011-01-15 LAB — DIFFERENTIAL
Basophils Absolute: 0 10*3/uL (ref 0.0–0.1)
Eosinophils Relative: 3 % (ref 0–5)
Monocytes Absolute: 1.6 10*3/uL — ABNORMAL HIGH (ref 0.1–1.0)
Monocytes Relative: 15 % — ABNORMAL HIGH (ref 3–12)
Neutrophils Relative %: 57 % (ref 43–77)

## 2011-01-15 LAB — CULTURE, BAL-QUANTITATIVE W GRAM STAIN: Colony Count: 50000

## 2011-01-15 LAB — COMPREHENSIVE METABOLIC PANEL
Albumin: 2.9 g/dL — ABNORMAL LOW (ref 3.5–5.2)
BUN: 51 mg/dL — ABNORMAL HIGH (ref 6–23)
Calcium: 8.4 mg/dL (ref 8.4–10.5)
Creatinine, Ser: 7.61 mg/dL — ABNORMAL HIGH (ref 0.4–1.5)
Potassium: 5 mEq/L (ref 3.5–5.1)
Total Protein: 6 g/dL (ref 6.0–8.3)

## 2011-01-15 LAB — POCT I-STAT 3, ART BLOOD GAS (G3+)
Patient temperature: 98.4
TCO2: 28 mmol/L (ref 0–100)
pH, Arterial: 7.326 — ABNORMAL LOW (ref 7.350–7.450)

## 2011-01-15 LAB — MAGNESIUM
Magnesium: 2 mg/dL (ref 1.5–2.5)
Magnesium: 2 mg/dL (ref 1.5–2.5)

## 2011-01-15 LAB — DRUG SCREEN PANEL (SERUM)

## 2011-01-15 LAB — BASIC METABOLIC PANEL
CO2: 25 mEq/L (ref 19–32)
GFR calc Af Amer: 18 mL/min — ABNORMAL LOW (ref >60)
GFR calc non Af Amer: 15 mL/min — ABNORMAL LOW (ref >60)
Glucose, Bld: 93 mg/dL (ref 70–99)
Potassium: 5.3 mEq/L — ABNORMAL HIGH (ref 3.5–5.1)
Sodium: 136 mEq/L (ref 135–145)

## 2011-01-16 LAB — COMPREHENSIVE METABOLIC PANEL
Albumin: 2.7 g/dL — ABNORMAL LOW (ref 3.5–5.2)
Alkaline Phosphatase: 112 U/L (ref 39–117)
BUN: 58 mg/dL — ABNORMAL HIGH (ref 6–23)
Calcium: 8.2 mg/dL — ABNORMAL LOW (ref 8.4–10.5)
Creatinine, Ser: 5.32 mg/dL — ABNORMAL HIGH (ref 0.4–1.5)
Glucose, Bld: 102 mg/dL — ABNORMAL HIGH (ref 70–99)
Total Protein: 5.5 g/dL — ABNORMAL LOW (ref 6.0–8.3)

## 2011-01-16 LAB — URINALYSIS, ROUTINE W REFLEX MICROSCOPIC
Bilirubin Urine: NEGATIVE
Hgb urine dipstick: NEGATIVE
Nitrite: NEGATIVE
Protein, ur: >300 mg/dL — AB
Specific Gravity, Urine: 1.018 (ref 1.005–1.030)
Urobilinogen, UA: 0.2 mg/dL (ref 0.0–1.0)

## 2011-01-16 LAB — CBC
HCT: 35.7 % — ABNORMAL LOW (ref 39.0–52.0)
MCH: 27.5 pg (ref 26.0–34.0)
MCHC: 31.9 g/dL (ref 30.0–36.0)
MCV: 86.2 fL (ref 78.0–100.0)
Platelets: 257 10*3/uL (ref 150–400)
RDW: 17.1 % — ABNORMAL HIGH (ref 11.5–15.5)
WBC: 17 10*3/uL — ABNORMAL HIGH (ref 4.0–10.5)

## 2011-01-16 LAB — URINE CULTURE
Colony Count: NO GROWTH
Culture: NO GROWTH

## 2011-01-16 LAB — DIFFERENTIAL
Basophils Relative: 1 % (ref 0–1)
Eosinophils Absolute: 6.7 10*3/uL — ABNORMAL HIGH (ref 0.0–0.7)
Lymphocytes Relative: 25 % (ref 12–46)
Monocytes Relative: 6 % (ref 3–12)
Neutro Abs: 4.8 10*3/uL (ref 1.7–7.7)
Neutrophils Relative %: 28 % — ABNORMAL LOW (ref 43–77)

## 2011-01-16 LAB — PROTIME-INR
INR: 0.97 (ref 0.00–1.49)
Prothrombin Time: 12.8 seconds (ref 11.6–15.2)

## 2011-01-16 LAB — URINE MICROSCOPIC-ADD ON

## 2011-01-16 LAB — LACTIC ACID, PLASMA: Lactic Acid, Venous: 0.9 mmol/L (ref 0.5–2.2)

## 2011-01-19 LAB — DIFFERENTIAL
Eosinophils Absolute: 0.1 10*3/uL (ref 0.0–0.7)
Eosinophils Relative: 2 % (ref 0–5)
Lymphs Abs: 2.5 10*3/uL (ref 0.7–4.0)
Monocytes Absolute: 1.1 10*3/uL — ABNORMAL HIGH (ref 0.1–1.0)
Monocytes Relative: 14 % — ABNORMAL HIGH (ref 3–12)
Neutrophils Relative %: 50 % (ref 43–77)

## 2011-01-19 LAB — CBC
HCT: 31.2 % — ABNORMAL LOW (ref 39.0–52.0)
Hemoglobin: 10.3 g/dL — ABNORMAL LOW (ref 13.0–17.0)
MCHC: 33.1 g/dL (ref 30.0–36.0)
MCHC: 33.4 g/dL (ref 30.0–36.0)
Platelets: 249 10*3/uL (ref 150–400)
Platelets: 255 10*3/uL (ref 150–400)
RDW: 16.1 % — ABNORMAL HIGH (ref 11.5–15.5)
RDW: 16.3 % — ABNORMAL HIGH (ref 11.5–15.5)

## 2011-01-19 LAB — COMPREHENSIVE METABOLIC PANEL
ALT: 19 U/L (ref 0–53)
AST: 24 U/L (ref 0–37)
Albumin: 2.6 g/dL — ABNORMAL LOW (ref 3.5–5.2)
Calcium: 8.3 mg/dL — ABNORMAL LOW (ref 8.4–10.5)
GFR calc Af Amer: 21 mL/min — ABNORMAL LOW (ref >60)
Potassium: 3.8 mEq/L (ref 3.5–5.1)
Sodium: 134 mEq/L — ABNORMAL LOW (ref 135–145)
Total Protein: 5.3 g/dL — ABNORMAL LOW (ref 6.0–8.3)

## 2011-01-19 LAB — RENAL FUNCTION PANEL
Chloride: 101 mEq/L (ref 96–112)
Glucose, Bld: 88 mg/dL (ref 70–99)
Phosphorus: 5.7 mg/dL — ABNORMAL HIGH (ref 2.3–4.6)
Potassium: 4.2 mEq/L (ref 3.5–5.1)
Sodium: 136 mEq/L (ref 135–145)

## 2011-01-19 LAB — PROTIME-INR: INR: 0.94 (ref 0.00–1.49)

## 2011-01-19 LAB — HEPATITIS B SURFACE ANTIGEN: Hepatitis B Surface Ag: NEGATIVE

## 2011-01-20 LAB — APTT: aPTT: 28 seconds (ref 24–37)

## 2011-01-20 LAB — CBC
HCT: 24.4 % — ABNORMAL LOW (ref 39.0–52.0)
HCT: 25.1 % — ABNORMAL LOW (ref 39.0–52.0)
HCT: 16.3 % — ABNORMAL LOW (ref 39.0–52.0)
HCT: 26.5 % — ABNORMAL LOW (ref 39.0–52.0)
HCT: 29 % — ABNORMAL LOW (ref 39.0–52.0)
Hemoglobin: 8.4 g/dL — ABNORMAL LOW (ref 13.0–17.0)
Hemoglobin: 8.6 g/dL — ABNORMAL LOW (ref 13.0–17.0)
Hemoglobin: 9.1 g/dL — ABNORMAL LOW (ref 13.0–17.0)
Hemoglobin: 9.1 g/dL — ABNORMAL LOW (ref 13.0–17.0)
MCHC: 34.4 g/dL (ref 30.0–36.0)
MCHC: 34.2 g/dL (ref 30.0–36.0)
MCHC: 34.5 g/dL (ref 30.0–36.0)
MCHC: 35.3 g/dL (ref 30.0–36.0)
MCV: 97.5 fL (ref 78.0–100.0)
MCV: 96.4 fL (ref 78.0–100.0)
MCV: 99.9 fL (ref 78.0–100.0)
Platelets: 174 10*3/uL (ref 150–400)
Platelets: 178 10*3/uL (ref 150–400)
Platelets: 195 10*3/uL (ref 150–400)
Platelets: 211 10*3/uL (ref 150–400)
Platelets: 223 10*3/uL (ref 150–400)
Platelets: 251 10*3/uL (ref 150–400)
RBC: 2.74 MIL/uL — ABNORMAL LOW (ref 4.22–5.81)
RDW: 16.8 % — ABNORMAL HIGH (ref 11.5–15.5)
RDW: 15.6 % — ABNORMAL HIGH (ref 11.5–15.5)
RDW: 16 % — ABNORMAL HIGH (ref 11.5–15.5)
RDW: 17.6 % — ABNORMAL HIGH (ref 11.5–15.5)
RDW: 18 % — ABNORMAL HIGH (ref 11.5–15.5)
WBC: 7.1 10*3/uL (ref 4.0–10.5)
WBC: 7.9 10*3/uL (ref 4.0–10.5)
WBC: 9.1 10*3/uL (ref 4.0–10.5)
WBC: 9.2 10*3/uL (ref 4.0–10.5)

## 2011-01-20 LAB — RENAL FUNCTION PANEL
Albumin: 2 g/dL — ABNORMAL LOW (ref 3.5–5.2)
Albumin: 2.3 g/dL — ABNORMAL LOW (ref 3.5–5.2)
BUN: 42 mg/dL — ABNORMAL HIGH (ref 6–23)
BUN: 51 mg/dL — ABNORMAL HIGH (ref 6–23)
BUN: 53 mg/dL — ABNORMAL HIGH (ref 6–23)
CO2: 24 mEq/L (ref 19–32)
CO2: 20 mEq/L (ref 19–32)
Calcium: 7.2 mg/dL — ABNORMAL LOW (ref 8.4–10.5)
Calcium: 7.6 mg/dL — ABNORMAL LOW (ref 8.4–10.5)
Chloride: 110 mEq/L (ref 96–112)
Chloride: 113 mEq/L — ABNORMAL HIGH (ref 96–112)
Chloride: 116 mEq/L — ABNORMAL HIGH (ref 96–112)
Creatinine, Ser: 4.54 mg/dL — ABNORMAL HIGH (ref 0.4–1.5)
Creatinine, Ser: 5.06 mg/dL — ABNORMAL HIGH (ref 0.4–1.5)
GFR calc Af Amer: 15 mL/min — ABNORMAL LOW (ref >60)
GFR calc Af Amer: 15 mL/min — ABNORMAL LOW (ref >60)
GFR calc non Af Amer: 14 mL/min — ABNORMAL LOW (ref >60)
GFR calc non Af Amer: 12 mL/min — ABNORMAL LOW (ref >60)
Glucose, Bld: 82 mg/dL (ref 70–99)
Phosphorus: 7.1 mg/dL — ABNORMAL HIGH (ref 2.3–4.6)
Potassium: 4.4 mEq/L (ref 3.5–5.1)
Sodium: 139 mEq/L (ref 135–145)
Sodium: 140 mEq/L (ref 135–145)

## 2011-01-20 LAB — CROSSMATCH

## 2011-01-20 LAB — HEMOGLOBIN AND HEMATOCRIT, BLOOD: HCT: 22.6 % — ABNORMAL LOW (ref 39.0–52.0)

## 2011-01-20 LAB — COMPREHENSIVE METABOLIC PANEL
ALT: 39 U/L (ref 0–53)
AST: 25 U/L (ref 0–37)
Albumin: 2.1 g/dL — ABNORMAL LOW (ref 3.5–5.2)
Alkaline Phosphatase: 96 U/L (ref 39–117)
BUN: 51 mg/dL — ABNORMAL HIGH (ref 6–23)
Chloride: 120 mEq/L — ABNORMAL HIGH (ref 96–112)
GFR calc Af Amer: 15 mL/min — ABNORMAL LOW (ref >60)
Potassium: 5.2 mEq/L — ABNORMAL HIGH (ref 3.5–5.1)
Sodium: 141 mEq/L (ref 135–145)
Total Bilirubin: 0.4 mg/dL (ref 0.3–1.2)
Total Protein: 4.4 g/dL — ABNORMAL LOW (ref 6.0–8.3)

## 2011-01-20 LAB — RETICULOCYTES
RBC.: 2.37 MIL/uL — ABNORMAL LOW (ref 4.22–5.81)
Retic Count, Absolute: 30.8 10*3/uL (ref 19.0–186.0)
Retic Ct Pct: 1.3 % (ref 0.4–3.1)

## 2011-01-20 LAB — DIFFERENTIAL
Basophils Absolute: 0.1 10*3/uL (ref 0.0–0.1)
Basophils Relative: 1 % (ref 0–1)
Eosinophils Relative: 3 % (ref 0–5)
Monocytes Absolute: 1 10*3/uL (ref 0.1–1.0)
Monocytes Relative: 11 % (ref 3–12)
Neutro Abs: 5 10*3/uL (ref 1.7–7.7)

## 2011-01-20 LAB — BASIC METABOLIC PANEL
BUN: 35 mg/dL — ABNORMAL HIGH (ref 6–23)
BUN: 49 mg/dL — ABNORMAL HIGH (ref 6–23)
CO2: 20 mEq/L (ref 19–32)
CO2: 15 mEq/L — ABNORMAL LOW (ref 19–32)
Calcium: 7.4 mg/dL — ABNORMAL LOW (ref 8.4–10.5)
Chloride: 102 mEq/L (ref 96–112)
Chloride: 111 mEq/L (ref 96–112)
Chloride: 118 mEq/L — ABNORMAL HIGH (ref 96–112)
Creatinine, Ser: 4.98 mg/dL — ABNORMAL HIGH (ref 0.4–1.5)
GFR calc Af Amer: 15 mL/min — ABNORMAL LOW (ref >60)
Glucose, Bld: 100 mg/dL — ABNORMAL HIGH (ref 70–99)
Glucose, Bld: 78 mg/dL (ref 70–99)
Glucose, Bld: 77 mg/dL (ref 70–99)
Potassium: 4 mEq/L (ref 3.5–5.1)
Potassium: 4.7 mEq/L (ref 3.5–5.1)
Sodium: 136 mEq/L (ref 135–145)
Sodium: 138 mEq/L (ref 135–145)

## 2011-01-20 LAB — HEMOCCULT GUIAC POC 1CARD (OFFICE): Fecal Occult Bld: NEGATIVE

## 2011-01-20 LAB — POCT I-STAT 4, (NA,K, GLUC, HGB,HCT)
Glucose, Bld: 87 mg/dL (ref 70–99)
HCT: 19 % — ABNORMAL LOW (ref 39.0–52.0)

## 2011-01-20 LAB — POTASSIUM: Potassium: 4.9 mEq/L (ref 3.5–5.1)

## 2011-01-20 LAB — PROTIME-INR
INR: 1.03 (ref 0.00–1.49)
Prothrombin Time: 13.4 seconds (ref 11.6–15.2)

## 2011-01-20 LAB — URINALYSIS, ROUTINE W REFLEX MICROSCOPIC
Glucose, UA: 100 mg/dL — AB
Ketones, ur: NEGATIVE mg/dL
Protein, ur: >300 mg/dL — AB

## 2011-01-20 LAB — VITAMIN B12: Vitamin B-12: 322 pg/mL (ref 211–911)

## 2011-01-20 LAB — HEPATITIS B CORE ANTIBODY, TOTAL: Hep B Core Total Ab: NEGATIVE

## 2011-01-20 LAB — FOLATE: Folate: 9.7 ng/mL

## 2011-01-21 LAB — RAPID URINE DRUG SCREEN, HOSP PERFORMED
Amphetamines: NOT DETECTED
Benzodiazepines: POSITIVE — AB
Cocaine: NOT DETECTED
Opiates: NOT DETECTED
Tetrahydrocannabinol: POSITIVE — AB

## 2011-01-21 LAB — POCT I-STAT, CHEM 8
BUN: 34 mg/dL — ABNORMAL HIGH (ref 6–23)
Calcium, Ion: 1.11 mmol/L — ABNORMAL LOW (ref 1.12–1.32)
TCO2: 16 mmol/L (ref 0–100)

## 2011-02-17 ENCOUNTER — Inpatient Hospital Stay (HOSPITAL_COMMUNITY): Payer: Medicare Other

## 2011-02-17 ENCOUNTER — Inpatient Hospital Stay (HOSPITAL_COMMUNITY)
Admission: EM | Admit: 2011-02-17 | Discharge: 2011-02-20 | DRG: 208 | Disposition: A | Discharge status: Home Health | Payer: Medicare Other | Source: Other Acute Inpatient Hospital | Attending: Internal Medicine | Admitting: Internal Medicine

## 2011-02-17 DIAGNOSIS — F1210 Cannabis abuse, uncomplicated: Secondary | ICD-10-CM | POA: Diagnosis present

## 2011-02-17 DIAGNOSIS — F17200 Nicotine dependence, unspecified, uncomplicated: Secondary | ICD-10-CM | POA: Diagnosis present

## 2011-02-17 DIAGNOSIS — N2581 Secondary hyperparathyroidism of renal origin: Secondary | ICD-10-CM | POA: Diagnosis present

## 2011-02-17 DIAGNOSIS — D72829 Elevated white blood cell count, unspecified: Secondary | ICD-10-CM | POA: Diagnosis present

## 2011-02-17 DIAGNOSIS — E872 Acidosis, unspecified: Secondary | ICD-10-CM | POA: Diagnosis present

## 2011-02-17 DIAGNOSIS — J9600 Acute respiratory failure, unspecified whether with hypoxia or hypercapnia: Principal | ICD-10-CM | POA: Diagnosis present

## 2011-02-17 DIAGNOSIS — N186 End stage renal disease: Secondary | ICD-10-CM | POA: Diagnosis present

## 2011-02-17 DIAGNOSIS — F1410 Cocaine abuse, uncomplicated: Secondary | ICD-10-CM | POA: Diagnosis present

## 2011-02-17 DIAGNOSIS — I120 Hypertensive chronic kidney disease with stage 5 chronic kidney disease or end stage renal disease: Secondary | ICD-10-CM | POA: Diagnosis present

## 2011-02-17 DIAGNOSIS — R6510 Systemic inflammatory response syndrome (SIRS) of non-infectious origin without acute organ dysfunction: Secondary | ICD-10-CM | POA: Diagnosis present

## 2011-02-17 DIAGNOSIS — N039 Chronic nephritic syndrome with unspecified morphologic changes: Secondary | ICD-10-CM | POA: Diagnosis present

## 2011-02-17 DIAGNOSIS — J810 Acute pulmonary edema: Secondary | ICD-10-CM

## 2011-02-17 DIAGNOSIS — K219 Gastro-esophageal reflux disease without esophagitis: Secondary | ICD-10-CM | POA: Diagnosis present

## 2011-02-17 DIAGNOSIS — D631 Anemia in chronic kidney disease: Secondary | ICD-10-CM | POA: Diagnosis present

## 2011-02-17 DIAGNOSIS — F341 Dysthymic disorder: Secondary | ICD-10-CM | POA: Diagnosis present

## 2011-02-17 DIAGNOSIS — J811 Chronic pulmonary edema: Secondary | ICD-10-CM | POA: Diagnosis present

## 2011-02-17 DIAGNOSIS — G40909 Epilepsy, unspecified, not intractable, without status epilepticus: Secondary | ICD-10-CM | POA: Diagnosis present

## 2011-02-17 DIAGNOSIS — I2510 Atherosclerotic heart disease of native coronary artery without angina pectoris: Secondary | ICD-10-CM | POA: Diagnosis present

## 2011-02-17 DIAGNOSIS — E875 Hyperkalemia: Secondary | ICD-10-CM | POA: Diagnosis present

## 2011-02-17 DIAGNOSIS — Z992 Dependence on renal dialysis: Secondary | ICD-10-CM

## 2011-02-17 DIAGNOSIS — B1920 Unspecified viral hepatitis C without hepatic coma: Secondary | ICD-10-CM | POA: Diagnosis present

## 2011-02-17 DIAGNOSIS — E7800 Pure hypercholesterolemia, unspecified: Secondary | ICD-10-CM | POA: Diagnosis present

## 2011-02-17 LAB — COMPREHENSIVE METABOLIC PANEL
Albumin: 2.7 g/dL — ABNORMAL LOW (ref 3.5–5.2)
BUN: 63 mg/dL — ABNORMAL HIGH (ref 6–23)
Calcium: 9.3 mg/dL (ref 8.4–10.5)
Creatinine, Ser: 9.05 mg/dL — ABNORMAL HIGH (ref 0.4–1.5)
Total Bilirubin: 1.9 mg/dL — ABNORMAL HIGH (ref 0.3–1.2)
Total Protein: 6.1 g/dL (ref 6.0–8.3)

## 2011-02-17 LAB — GLUCOSE, CAPILLARY
Glucose-Capillary: 165 mg/dL — ABNORMAL HIGH (ref 70–99)
Glucose-Capillary: 126 mg/dL — ABNORMAL HIGH (ref 70–99)
Glucose-Capillary: 116 mg/dL — ABNORMAL HIGH (ref 70–99)

## 2011-02-17 LAB — POCT I-STAT 3, ART BLOOD GAS (G3+)
Acid-Base Excess: 3 mmol/L — ABNORMAL HIGH (ref 0.0–2.0)
Bicarbonate: 26.3 mEq/L — ABNORMAL HIGH (ref 20.0–24.0)
O2 Saturation: 95 %
Patient temperature: 98.6
TCO2: 23 mmol/L (ref 0–100)
pCO2 arterial: 44 mmHg (ref 35.0–45.0)

## 2011-02-17 LAB — CARBAMAZEPINE LEVEL, TOTAL: Carbamazepine Lvl: 5.5 ug/mL (ref 4.0–12.0)

## 2011-02-17 LAB — PROCALCITONIN: Procalcitonin: 1 ng/mL

## 2011-02-17 LAB — CBC
Hemoglobin: 12.4 g/dL — ABNORMAL LOW (ref 13.0–17.0)
MCH: 33.6 pg (ref 26.0–34.0)
MCV: 100.8 fL — ABNORMAL HIGH (ref 78.0–100.0)
Platelets: 138 10*3/uL — ABNORMAL LOW (ref 150–400)
RBC: 3.69 MIL/uL — ABNORMAL LOW (ref 4.22–5.81)

## 2011-02-17 LAB — T3: T3, Total: 38.8 ng/dl — ABNORMAL LOW (ref 80.0–204.0)

## 2011-02-17 LAB — CARDIAC PANEL(CRET KIN+CKTOT+MB+TROPI): CK, MB: 1.9 ng/mL (ref 0.3–4.0)

## 2011-02-17 LAB — PHOSPHORUS: Phosphorus: 5.2 mg/dL — ABNORMAL HIGH (ref 2.3–4.6)

## 2011-02-17 LAB — MRSA PCR SCREENING: MRSA by PCR: NEGATIVE

## 2011-02-17 LAB — LACTIC ACID, PLASMA: Lactic Acid, Venous: 2.2 mmol/L (ref 0.5–2.2)

## 2011-02-17 LAB — APTT: aPTT: 33 seconds (ref 24–37)

## 2011-02-18 ENCOUNTER — Inpatient Hospital Stay (HOSPITAL_COMMUNITY): Payer: Medicare Other

## 2011-02-18 LAB — RENAL FUNCTION PANEL
Albumin: 2.7 g/dL — ABNORMAL LOW (ref 3.5–5.2)
BUN: 29 mg/dL — ABNORMAL HIGH (ref 6–23)
Creatinine, Ser: 5.18 mg/dL — ABNORMAL HIGH (ref 0.4–1.5)
Phosphorus: 4.7 mg/dL — ABNORMAL HIGH (ref 2.3–4.6)

## 2011-02-18 LAB — CBC
MCH: 32.9 pg (ref 26.0–34.0)
MCHC: 33.6 g/dL (ref 30.0–36.0)
Platelets: 136 10*3/uL — ABNORMAL LOW (ref 150–400)
RBC: 3.71 MIL/uL — ABNORMAL LOW (ref 4.22–5.81)

## 2011-02-18 LAB — GLUCOSE, CAPILLARY
Glucose-Capillary: 119 mg/dL — ABNORMAL HIGH (ref 70–99)
Glucose-Capillary: 91 mg/dL (ref 70–99)
Glucose-Capillary: 96 mg/dL (ref 70–99)

## 2011-02-18 LAB — PROCALCITONIN: Procalcitonin: 5.05 ng/mL

## 2011-02-19 ENCOUNTER — Inpatient Hospital Stay (HOSPITAL_COMMUNITY): Payer: Medicare Other

## 2011-02-19 LAB — RENAL FUNCTION PANEL
Albumin: 2.5 g/dL — ABNORMAL LOW (ref 3.5–5.2)
Chloride: 95 mEq/L — ABNORMAL LOW (ref 96–112)
GFR calc Af Amer: 11 mL/min — ABNORMAL LOW (ref >60)
GFR calc non Af Amer: 9 mL/min — ABNORMAL LOW (ref >60)
Phosphorus: 4.7 mg/dL — ABNORMAL HIGH (ref 2.3–4.6)
Potassium: 4.6 mEq/L (ref 3.5–5.1)
Sodium: 133 mEq/L — ABNORMAL LOW (ref 135–145)

## 2011-02-19 LAB — CBC
MCV: 96.9 fL (ref 78.0–100.0)
Platelets: 130 10*3/uL — ABNORMAL LOW (ref 150–400)
RBC: 3.53 MIL/uL — ABNORMAL LOW (ref 4.22–5.81)
WBC: 12.5 10*3/uL — ABNORMAL HIGH (ref 4.0–10.5)

## 2011-02-19 LAB — GLUCOSE, CAPILLARY
Glucose-Capillary: 86 mg/dL (ref 70–99)
Glucose-Capillary: 85 mg/dL (ref 70–99)
Glucose-Capillary: 87 mg/dL (ref 70–99)

## 2011-02-19 LAB — PROCALCITONIN: Procalcitonin: 5.55 ng/mL
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
## 2011-02-20 LAB — CBC
HCT: 36.8 % — ABNORMAL LOW (ref 39.0–52.0)
Hemoglobin: 12.4 g/dL — ABNORMAL LOW (ref 13.0–17.0)
MCHC: 33.7 g/dL (ref 30.0–36.0)
MCV: 97.6 fL (ref 78.0–100.0)
RDW: 13.5 % (ref 11.5–15.5)

## 2011-02-24 LAB — CULTURE, BLOOD (ROUTINE X 2): Culture: NO GROWTH

## 2011-03-09 NOTE — Discharge Summary (Signed)
NAME:  LADARRIAN, ASENCIO NO.:  [id number]  MEDICAL RECORD NO.:  [id number]           PATIENT TYPE:  I  LOCATION:  6731                         FACILITY:  [facility]  PHYSICIAN:  Nelda Bucks, MD DATE OF BIRTH:  14-Dec-1960  DATE OF ADMISSION:  02/17/2011 DATE OF DISCHARGE:  02/20/2011                              DISCHARGE SUMMARY   PULMONARY INDIVIDUAL:  Nature conservation officer.  DISCHARGE DIAGNOSES: 1. Seizures. 2. Coronary artery disease. 3. Acute respiratory failure. 4. End-stage renal disease. 5. Leukocytosis. 6. Sign and symptoms of systemic inflammatory response syndrome.  HISTORY OF PRESENT ILLNESS:  Mr. Melchior is a 50 year old white male with a past medical history significant for end-stage renal disease on hemodialysis, hepatitis C, seizure disorder, and transferred from [facility] for acute respiratory distress in the setting of missed hemodialysis on February 14, 2011.  He was also noted to be hyperkalemic with a potassium of 5.9.  Blood cultures on February 17, 2011, showed no growth.  Antibiotics consisted of vancomycin for February 17, 2011 and February 19, 2011, ceftazidime for February 17, 2011 and February 19, 2011, Levaquin for February 19, 2011, with a planned discontinue day of February 23, 2011.  CONSULTS:  Renal.  BEST PRACTICE:  [facility].  STUDIES:  None.  He was transferred to [facility] on February 17, 2011.  Renal was consulted and underwent hemodialysis for the net 4.6 liters negative. On April 18, 202, he was extubated.  LAB DATA:  Hemoglobin 11.5, hematocrit 34.2, WBCs 12.5, platelets are 137 and 133.  Chloride 95, BUN 55, creatinine 6.74, phosphorous 4.7, albumin 2.5, K is 4.6, bicarb 25, glucose 83, calcium 8.5.  CPK 15, CPK- MB is 1.2, troponin I is 0.03.  HOSPITAL COURSE BY DISCHARGE DIAGNOSES: 1. Ventilator-dependent respiratory failure secondary to severe     pulmonary edema after missing hemodialysis on February 04, 2011.  He     was admitted to [facility] and transferred from [facility].  He underwent urgent hemodialysis and was a negative 4.6     liters.  Chest x-ray on February 17, 2011, showed improvement in     pulmonary edema, but question right-sided infiltrate.  He was     successfully liberated from mechanical ventilatory support on February 18, 2011, and was on room air.  He will continue his hemodialysis.     He was seen by ID Services.  He had a repeat PA and lateral chest x-     ray to define questionable pneumonia. 2. Seizures.  He had a Tegretol level within normal limits.  There was     no evidence of seizure activity.  He will continue with his home     medication. 3. History of coronary artery disease.  His cardiac enzymes were     negative x3. 4. End-stage renal disease.  He had dialysis, but he missed on February 14, 2011.  He will follow up at [facility]     on Saturday, day after discharge. 5.  Leukocytosis which improved.  There is questionable infection.     Therefore, he is to continue on Levaquin empirically until February 23, 2011.  DISCHARGE MEDICATIONS:  Available in his medication reconciliation sheet.  DIET:  Renal diet.  DISPOSITION/CONDITION ON DISCHARGE:  At the time of this dictation is improved.  Note that this is a complex discharge summary that took greater than 40 minutes.     Devra Dopp, MSN, ACNP   ______________________________ Nelda Bucks, MD    SM/MEDQ  D:  02/20/2011  T:  02/20/2011  Job:  [id number]  cc:   Lonzo Cloud. Kriste Basque, MD Cecille Aver, M.D.  Electronically Signed by Devra Dopp MSN ACNP on 03/02/2011 04:42:56 PM Electronically Signed by Rory Percy MD on 03/09/2011 11:42:02 AM

## 2011-03-17 NOTE — Procedures (Signed)
CEPHALIC VEIN MAPPING   INDICATION:  Preoperative placement of AV fistula.   HISTORY:   EXAM:   The right cephalic vein was compressible.   Diameter measurements ranged from 0.22 to 0.65 cm.   The right basilic vein was compressible.   Diameter measurements ranged from 0.24 to 0.27 cm.   The left cephalic vein was compressible.   Diameter measurements ranged from 0.15 to 0.44 cm.   The left basilic vein was compressible.   Diameter measurements ranged from 0.35 cm to 0.38 cm.   See attached worksheet for all measurements.   IMPRESSION:  1. Patent's bilateral cephalic veins are a marginal acceptable      diameter for use as a dialysis access site.  2. Bilateral basilic veins are not visualized past antecubital fossa      level.   ___________________________________________  V. Charlena Cross, MD   CB/MEDQ  D:  02/18/2010  T:  02/18/2010  Job:  [id number]

## 2011-03-17 NOTE — Assessment & Plan Note (Signed)
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  He has  been evaluated both here and at [facility].  He had a liver biopsy  10 years ago which showed no fibrosis and very minimal changes.  His  liver function tests throughout the last 10 years have remained normal.  He has really done well without any problems with encephalopathy, GI  bleeding, or portal hypertension.  He really is fairly asymptomatic at  this time but recently had a complete physical exam by Dr. Kriste Basque on May 02, 2007, which was normal except for a serum albumin of 2.2 grams  percent.  Lab data was all otherwise fairly unremarkable except for a  slightly elevated TSH at 7.6.  A year ago his prothrombin time, iron  levels, and alpha fetoprotein were normal as was an upper abdominal  ultrasound exam of his liver.  He is sure he did not have a hepatoma.   Graden denies specifically pruritus, abdominal discomfort, abdominal  swelling, peripheral edema, but he does periodically have skin rash  problems especially in the sunlight.  He has had some recent mental  status changes with forgetfulness.   He takes:  1. Diovan 160 mg a day.  2. Carbatrol 600 mg twice a day.  3. Valium 5 mg twice a day.  And no other medications at this time.   He denies drug allergies.   He is followed by a local neurologist for his seizure disorder.   PHYSICAL EXAMINATION:  VITAL SIGNS:  Show him to weigh 176 pounds, which  is up approximately 10 pounds from his normal weight.  Blood pressure is  138/82 and pulse was 88 and regular.  SKIN:  He has a very metallic cirrhotic appearing to his skin color.  MENTAL  STATUS:  Generally clear and there was no asterixis.  I could not  appreciate scleral icterus or other stigmata of chronic liver disease.  CHEST:  Clear.  HEART:  He appeared to be in a regular rhythm without significant  murmurs, gallops, or rubs.  ABDOMEN:  Showed no organomegaly, masses, tenderness, or ascites.  EXTREMITIES:  There was no peripheral edema or phlebitis.  RECTAL:  Deferred.   ASSESSMENT:  1. I am concerned about Fulton's low serum albumin certainly suggesting      progressive cirrhosis.  He has not had liver biopsy performed in      the last 10 years and he has had normal liver function tests.  He      does have known hepatitis C positivity.  2. Chronic seizure disorder.  3. Porphyria cutanea tardae.  4. Previous Nissen fundoplication for acid reflux.  5. Previous left orchidopexy in 1973.  6. History of multiple tattoos.   RECOMMENDATIONS:  1. Repeat endoscopy, screen  for varices.  2. Repeat abdominal ultrasound exam.  3. Check alpha fetoprotein level, prothrombin time, iron levels again,      and also hepatitis C RNA quantitative values.  4. I am fairly confident this patient will need repeat liver biopsy at      this time.  5. Continue seizure medications per his neurologist.  6. The patient may need to be referred to [facility] for consideration of      liver transplantation depending on his upcoming cirrhotic workup.   ADDENDUM:  The patient's previous hepatitis C quantitation in 1998 was 5  million copies per mL.     Vania Rea. Jarold Motto, MD, Caleen Essex, FAGA  Electronically Signed    DRP/MedQ  DD: 05/19/2007  DT: 05/19/2007  Job #: [id number]   cc:   Lonzo Cloud. Kriste Basque, MD

## 2011-03-17 NOTE — Assessment & Plan Note (Signed)
[facility]                                 ON-CALL NOTE   NAME:KINGDraxton, Luu                           MRN:          [id number]  DATE:04/30/2007                            DOB:          12-30-1960    TIME:  10:00 a.m.   PHONE NUMBER:  [phone].   PRIMARY CARE PHYSICIAN:  Alroy Dust, M.D.   REASON FOR PHONE CALL:  Blood pressure. The patient's sister, Avie Arenas, called stating that her brother was sick. He complained of  headaches and had some nausea but no vomiting. He had bilateral arm  pain, radiating to the shoulders. She noted ankle swelling as well. He  was coughing a little and had some sinus drainage and some question of  some streaky hemoptysis. He had been sick for several days but did not  call the office previously. When she returned to check on him, she got  the above mentioned symptoms and noted that his blood pressure was  slightly high in the 160/100 range.   I recommended that Mr. Englert consider going to the emergency room for a  full evaluation. If he feels that he can wait until Monday, he can call  the office and we can work him into the schedule. She understood these  instructions and said that she would discuss this with her brother and  let me know the first of the week.     Lonzo Cloud. Kriste Basque, MD  Electronically Signed    SMN/MedQ  DD: 04/30/2007  DT: 04/30/2007  Job #: [id number]

## 2011-03-17 NOTE — Assessment & Plan Note (Signed)
OFFICE VISIT   Sean [name], Sean [name]  DOB:  04-19-1961                                       02/17/2010  ZOXWR#:[id number]   REASON FOR VISIT:  Dialysis access.   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  He  has had progressive renal failure and now comes to discuss access.  He  is right-handed.  He has been taking methadone recently and he is now  off of this.  The patient suffers from hypertension which is medically  managed.   REVIEW OF SYSTEMS:  GENERAL:  Positive weight loss, loss of appetite.  CARDIAC:  Positive for shortness of breath with exertion.  PULMONARY:  Positive for bronchitis.  GI:  Positive diarrhea.  GU:  Positive kidney disease.  VASCULAR:  Negative.  NEURO:  Positive headaches and seizures.  MUSCULOSKELETAL:  Negative.  PSYCH:  Positive for depression and anxiety.  ENT:  Positive for change in eyesight.  HEME:  Negative.  SKIN:  Negative.   PAST MEDICAL HISTORY:  1. Hypertension.  2. Hepatitis C.  3. Seizure disorder.   FAMILY HISTORY:  Cardiovascular disease in his mother at age 62.   SOCIAL HISTORY:  Single, two children.  He is retired.  Smokes two packs  a day.  Does not drink.   ALLERGIES:  None.   PHYSICAL EXAM:  Heart rate 74, blood pressure 157/84, temperature is  97.8.  General:  Well-appearing in no distress.  HEENT:  Within normal  limits.  Lungs:  Clear bilaterally.  Cardiovascular:  Regular rate and  rhythm.  Abdomen:  Soft.  Musculoskeletal:  No major deformity.  Neuro:  There is no focal weakness.  Skin:  Without rash.   DIAGNOSTICS:  Vein mapping was performed today and the patient has an  adequate cephalic vein beginning in the upper arm.   ASSESSMENT/PLAN:  End-stage renal disease needing permanent access.   PLAN:  The patient will be scheduled for  a left upper arm fistula.  I  discussed with him making the antecubital incision in between his tattoo  inks.  His cephalic vein is easily visible.  He has palpable radial and  brachial pulses.  We discussed the risks of nonmaturity and the risk of  steal syndrome.  His operation is scheduled for Tuesday, April 26.     Jorge Ny, MD  Electronically Signed   VWB/MEDQ  D:  02/17/2010  T:  02/18/2010  Job:  [id number]   cc:   Cecille Aver, M.D.

## 2011-03-17 NOTE — Assessment & Plan Note (Signed)
[facility]                         GASTROENTEROLOGY OFFICE NOTE   NAME:Sean [name], Sean [name]                         MRN:          [id number]  DATE:06/30/2007                            DOB:          24-Aug-1961    Sean [name] has known chronic hepatitis C and has recently had some mild  mental status changes which have prompted the evaluation and liver  biopsy.  He additionally has chronic seizure disorder and is on multiple  medications, and has porphyria cutanea tardae.  Besides some mild mental  status changes, he has had no other symptoms, and specifically denies  nausea and vomiting.  He recently has had some bright red blood per  rectum.  His last colonoscopy was 10 years ago.   His appetite is good and his weight is normal.  He has had no abdominal  pain except for occasional discomfort in the right upper quadrant,  nonspecific in nature.  He recently underwent an ultrasound-guided liver  biopsy per radiology which showed chronic active hepatitis activity  grade 2, fibrosis stage 2 with focal stage 3 changes.   LABORATORY DATA:  Showed normal liver function tests except for low  serum albumin.  The Alpha-fetoprotein mark was slightly increased to 9.5  ng/ml.  Hepatitis C RNA quantitation showed 9,970,000 international  units per cc.  Prothrombin time was normal and iron levels were normal.  The serum ammonia was slightly increased to 42.  He has been taking  Chronulac 30 cc at bedtime without diarrhea.  In fact, he continues to  be constipated.   The patient, as mentioned previously, is on Diovan 160 mg a day,  Carbitol 600 mg twice a day, valium 5 mg twice a day, and lactulose at  bedtime.   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  He also has had multiple tattoos.  His endoscopy on May 22, 2007 showed no evidence of esophageal varices.  He has a past  history of hiatal hernia surgery.  He had some  erosive gastritis and was  treated with a Prevpac for 10 days successfully.  He is followed by Dr.  Kriste Basque for his asthmatic bronchitis and continued cigarette use.  The  patient denies any history of chronic alcohol abuse.  He is followed at  [facility] for a seizure disorder.   PHYSICAL EXAMINATION:  Shows him to weigh 177 pounds which is his normal  weight, and blood pressure is 124/90.  Pulse was 68 and regular.  His mental status is clear and there was no asterixis.  I could not appreciate scleral icterus.  ABDOMINAL EXAM:  Showed no definite organomegaly, masses, tenderness, or  ascites.  There was no peripheral edema or phlebitis.  Inspection of his rectum was unremarkable as was rectal exam.  There was  soft stool present and it was guaiac negative.   ASSESSMENT:  1. 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  2. History of porphyria cutanea tarda with normal iron levels.  3. Recently treated H. pylori gastritis with  no evidence of varices on      endoscopy.  4. Intermittent rectal bleeding, probably from hemorrhoidal process,      rule out colon polyps.  5. Chronic seizure disorder, on Carbitol.  6. History of asthmatic bronchitis and continued cigarette abuse.  7. Mild elevation of serum ammonia level without clinical      encephalopathy.   RECOMMENDATIONS:  1. Appointment at hepatitis C clinic for consideration of treatment of      his hepatitis C.  He also would need to be evaluated for possible      liver transplantation in the future should treatment be successful.  2. Increased Chronulac to 30 cc twice a day.  3. Outpatient colonoscopy at his convenience.  4. Continue other medications as per Dr. Kriste Basque.     Vania Rea. Jarold Motto, MD, Caleen Essex, FAGA  Electronically Signed    DRP/MedQ  DD: 06/30/2007  DT: 06/30/2007  Job #: [id number]   cc:   Lonzo Cloud. Kriste Basque, MD

## 2011-04-07 ENCOUNTER — Other Ambulatory Visit: Payer: Self-pay | Admitting: *Deleted

## 2011-04-07 MED ORDER — DIAZEPAM 10 MG PO TABS: 10.0000 mg | ORAL_TABLET | Freq: Three times a day (TID) | ORAL | Status: AC | PRN

## 2011-04-13 ENCOUNTER — Telehealth: Payer: Self-pay | Admitting: Pulmonary Disease

## 2011-04-13 ENCOUNTER — Encounter: Payer: Self-pay | Admitting: Pulmonary Disease

## 2011-04-13 NOTE — Telephone Encounter (Signed)
This is a duplicate message.  This has already been addressed.  Will sign off on message.

## 2011-04-13 NOTE — Telephone Encounter (Signed)
Called and spoke with Clayton. Rosa states pt is having difficulty with slurred speech and trouble talking, unable to walk, states he crawls everywhere, and also c/o dizziness.  Clotilde Dieter states she is going to call 911 and have pt transported to the [place] [facility] to be evaluated.  Will forward message to SN as an FYI.

## 2011-04-13 NOTE — Telephone Encounter (Signed)
SN is aware of message.  

## 2011-04-17 ENCOUNTER — Other Ambulatory Visit (INDEPENDENT_AMBULATORY_CARE_PROVIDER_SITE_OTHER): Payer: Medicare Other

## 2011-04-17 ENCOUNTER — Ambulatory Visit (INDEPENDENT_AMBULATORY_CARE_PROVIDER_SITE_OTHER): Payer: Medicare Other | Admitting: Adult Health

## 2011-04-17 ENCOUNTER — Other Ambulatory Visit: Payer: Self-pay | Admitting: Adult Health

## 2011-04-17 ENCOUNTER — Encounter: Payer: Self-pay | Admitting: Adult Health

## 2011-04-17 ENCOUNTER — Ambulatory Visit: Payer: Self-pay | Admitting: Pulmonary Disease

## 2011-04-17 DIAGNOSIS — E039 Hypothyroidism, unspecified: Secondary | ICD-10-CM

## 2011-04-17 DIAGNOSIS — N186 End stage renal disease: Secondary | ICD-10-CM

## 2011-04-17 DIAGNOSIS — I10 Essential (primary) hypertension: Secondary | ICD-10-CM

## 2011-04-17 DIAGNOSIS — E7800 Pure hypercholesterolemia, unspecified: Secondary | ICD-10-CM

## 2011-04-17 DIAGNOSIS — R51 Headache: Secondary | ICD-10-CM

## 2011-04-17 LAB — LIPID PANEL
HDL: 41.5 mg/dL (ref >39.00)
Total CHOL/HDL Ratio: 4
Triglycerides: 296 mg/dL — ABNORMAL HIGH (ref 0.0–149.0)
VLDL: 59.2 mg/dL — ABNORMAL HIGH (ref 0.0–40.0)

## 2011-04-17 LAB — HEPATIC FUNCTION PANEL
Albumin: 3.4 g/dL — ABNORMAL LOW (ref 3.5–5.2)
Total Bilirubin: 0.9 mg/dL (ref 0.3–1.2)

## 2011-04-17 LAB — TSH: TSH: 3.08 u[IU]/mL (ref 0.35–5.50)

## 2011-04-17 LAB — LDL CHOLESTEROL, DIRECT: Direct LDL: 84.2 mg/dL

## 2011-04-17 MED ORDER — HYDROCODONE-ACETAMINOPHEN 10-325 MG PO TABS: 1.0000 | ORAL_TABLET | Freq: Four times a day (QID) | ORAL | Status: DC | PRN

## 2011-04-17 NOTE — Progress Notes (Signed)
Subjective:    Patient ID: Sean [name], male    DOB: 24-May-1961, 50 y.o.   MRN: [id number]  HPI 50 y/o WM with known hx of Chronic HepC prev [facility], & Chronic renal failure on  Dialysis from DrGoldsborough et al...   ~ Jun10: BP meds adjusted by DrGoldsborough, and she started Simvastatin as well for his Chol... still smoking 1ppd & can't vs won't quit... HepC clinic has been following his TSH and he reports "it's leveled out & I don't need medication"... he is here today primarily to get a perscription for his Percocet10's- taking 1 Bid...   ~ Dec10: he has had another eventful 63mo- he is now off the [facility] therapy as it was not helpful w/ viral titers not responding... [facility] told him there may be some new treatment avail in 2011 & he is holding hope but they have not sched any follow up w/ him... he remains in the Nephrology clinic w/ DrGoldsborough every 69mo- and his Creat has risen from 2.2.5 to 3.6 over the last 5 months... they discussed dialysis at their last visit... he tells me that he is now under the care of a Neurologist in [place] for his seizure disorder & HA's... still smoking 1ppd- denies resp problems, and CXR 11/10 showed chr changes, NAD... 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 he states nerves & depression are doing satis on LEXAPRO 20mg /d & Valium 10mg Bid... he has lost weight down to 148# & prognosis is guarded at best.   ~ Mar 12, 2010: he was hosp 4/26 - 03/04/10 w/ severe anemia, progression to end stage renal disease & started on dialysis.Marland Kitchen. off BP med now due to low BP, and fluid status regulated thru dialysis center... also gets Aranesp & Venofer for anemia (+4u Tx in hosp) from the Nephrologists... GI eval w/ EGD showing enlarged gastric fold, no varices, no bleeding sites; Colonoscopy showed divertics, hems, & mult adenomatous polyps  removed (f/u rec 75yr), stool for occult blood was neg... still smoking 1ppd and can't vs won't quit, he refuses smoking cessation help or Chantix Rx... he has an abd wall hernia which he claims is quite painful & for which he was give Percocet10 in the hosp w/ consult from DrWyatt, CCS w/ outpt surg pending... he tells me that he finished the Methadone program 3/11 & has been off all narcs until this hosp... here for refill of Percocet10 & wants to switch Valium10 for Xanax1mg .   ~ May 01, 2010: add-on for "stomach problem" meaning diarrhea he says & unable to finish dialysis due to diarrhea... notes this all started after his hernia repair in 04/02/10- lap ventral hernia repair w/ mesh & lysis of adhesions by DrWakefield... hx remote Nissen & prev HPylori Rx... he had EGD & Colon recently by GI- Arlyce Dice & Marina Goodell- see above... we discussed poss IBS & Rx w/ Bentyl + fiber w/ GI follow up recommended... Also c/o "need more Valium" on 5mg  Tid for nerves & he wants 10mg Tid due to dialysis- I told him this was the max.   ~ October 29, 2010: he has been skipping his dialysis freq since he claims it causes severe HAs (averages 1-2 per week) & he was hosp 9/11 w/ VDRF due to vol overload when he missed dialysis in Sep... he claims that Nephrology won't help him w/ the HA problem (  although DrPatel & DrWebb gave him Oxycodone in Nov & Dec); and the [facility]) refused to see him for the HA prob (they see him for his seizure hx)... we provide him w/ Valium 10mg - 3 per day, and narcotic pain med> prev Vicodin now Norco 10/325- 3 per day... we will refer him to a [place] HA management clinic for further eval & Rx... still smoking 1ppd & has no interest in smoking cessation help; BP stable & managed by Dialysis; Chol Rx w/ Simva20 per DrGoldsborough; GI stable w/ known HepC, uses OTC PPI Prn; due for f/u labs today...   ~04/17/11 Follow up  Pt returns for a 6 month follow up and refills of pain  meds.  He has not been seen by the HA clinic yet. waiting on appointment. Was referred last ov however pt missed this appointment due to location confusion. Needs another referral. Has daily headache and pain -uses percocet Three times a day  To help control pain.   Says he had weakness episode 6/11 and went to ER in [place]. Had  labs and ct [facility] that [facility] says did not explain his symptoms . We do not have these records at this time.  Says Dr Kathrene Bongo did labs yesterday to evaluate his weakness to see if  dialysis is pulling too much off of him. He has HD set up for in am.   Continues to smoke and is not interested in smoking cesstation. Was admitted 02/2011 with seizure and SIRS. At that positive for marajuana and benzo. Advised on smoking and drug use.    Current Problems:  Hx of SINUSITIS (ICD-473.9) - ENT eval DrShoemaker 2/09 was neg...   CIGARETTE SMOKER (ICD-305.1) - still smoking >1ppd... he knows that he must quit smoking... discussed smoking cessation strategies including cessation programs, counselling, nicotine replacement, and Chantix receptor blockade...  Hx of ASTHMATIC BRONCHITIS, ACUTE (ICD-466.0) - no recent AB exacerbations...  ~ CXR 11/10 showed chronic changes but NAD.Marland Kitchen.  ~ CXR 4/11 showed chr bronchitic markings, NAD...   HYPERTENSION (ICD-401.9) - prev controlled on combination of Norvasc10, & Furosemide40... off all BP meds since 5/11 discharge & on dialysis now.    HYPERCHOLESTEROLEMIA (ICD-272.0) - started on SIMVASTATIN 20mg /d by DrGoldsborough... ?labs by Nephrology.  ~ FLP here 12/11 on Simva20 =   HYPOTHYROIDISM, BORDERLINE (ICD-244.9) - labs prev checked frequently from the [facility] showed sl elevated TSH in the 5-6 range... he was tried on LEVOTHYROID 66mcg/d but states he developed HA & was INTOL therefore stopped after only a few doses...  ~ labs 1/10 here showed TSH= 4.19, FreeT3= 2.2 (2.3-4.2), & FreeT4= 0.4  (0.6-1.6)... Levothy50 started.  ~ labs 3/10 from [facility] showed TSH= 6.57... rec> re-start Levothy50- 1/2 daily.  ~ pt states that Levothy was stopped in the interim- TSH 11/10 off med= 4.49  ~ labs 12/11 here showed TSH=   GERD (ICD-530.81) - Hx of prev Nissen fundoplcation... prev HPylori Rx.  ~ 3/10: given Zofran for nausea by the [facility]...  ~ EGD 5/11 by DrKaplan showed enlarged gastric folds, no varices, no bleeding sites... on PROTONIX 40mg Bid at disch from hosp, but he stopped on his own...   DIVERTICULOSIS OF COLON (ICD-562.10) & COLONIC POLYPS (ICD-211.3)  ~ Colonoscopy 5/11 DrPerry showed divertics, hems, 7 polypys= adenomatous, no bleeding sites... f/u rec 49yr w/ DrPatterson.   HEPATITIS C (ICD-070.51) - Hx of prev IV drug abuse, and mult tatoos... he has known  HepC w/ mild cirrhosis and chr active hepatitis on liver Bx 8/08...reviewed by DrPatterson for GI 8/08 and referred to the [facility]... he began PEGASYS & RIBAVIRIN 12/07/08 via the clinic and he was followed weekly>  ~ last clinic note of 04/09/09 reviewed- viral titer increased slightly (may be developing resistance).  ~ he was taken off therapy 7/10- rising viral titers, no option for other Rx at this time... he indicates that they were hopeful that some new Rx would be avail in 2011...   RENAL FAILURE, END STAGE (ICD-585.6) - found to have nephrotic range proteinuria, microscopic hematuria, and Creat= 1.45 w/ eval by DrGoldsborough for Nephrology including a renal biopsy showing Fibrillary GN, mod interstitial fibrosis, and tubular atrophy- stage 3 chronic kidney disease (no spec therapy avail- Rx for his HepC may help)... since then he has progressed to ESRD & started on dialysis.  ~ labs from [facility] 3/10 showed BUN= 35, Creat= 2.24, K= 5.9.Marland Kitchen. he was started on LASIX by Nephrology.  ~ note from [facility] 03/12/09 reviewed- stable RI, BP meds adjusted.  ~ labs from [facility] 6/10 showed Creat= 2.18, K=  4.8, HCO3= 14  ~ labs 11/10 showed BUN= 33, Creat= 3.6, K=4.1, HCO3= 23  ~ labs from 5/11 hosp showed BUN=53, Creat=5.21, K= 5.0, HCO3=16, Ca=7.2, Alb=2.0, PTH=339  ~ 5/11: started on dialysis by Nephrology & they do his labs now...   Hx of SEIZURE DISORDER (ICD-780.39) - as noted- prev followed at [facility] by DrO'Donovan and is taking CARBITROL 600mg Bid... he is on disability because of his seizure history... now followed at Neurology office in [place].   HEADACHE, MIXED (ICD-784.0) - he was referred to the [facility] after his appt 3/09 w/ severe HA he thought were "clusters"... pt never showed for the appt--- DrGoldsborough gave him Vicodin to use Prn (she doesn't want him on NSAIDs etc)...  ~ 6/10: he requests Percocet10 one Bid as nothing else helps his pain... I told him we would fill #60, no early refills, no other narcotics from anyone else or we will insist on his seeking help from a chr pain clinic for management...  ~ 12/10: he is on a chr METHADONE program via [facility] where he has to go every day for his dose.  ~ 5/11: he tells me that he finished the methadone clinic program & was off narcotics until 5/11 hosp w/ pain from abd wall hernia.  ~ 12/11: now c/o severe HAs that he blames on his dialysis- freq skips treatment & ave 1/wk; he claims the [facility] in  [place] will not see him for the HA prob; we fill rx for [facility] 10/325 Tid & will refer him to the HA management clinic for assessment.  ~04/2011 referred again to headache wellness center, did not go previously   ANXIETY (ICD-300.00) - he has mod anxiety and takes VALIUM 10mg Tid (max allowed dose).   DEPRESSION (ICD-311) - on LEXAPRO 20mg /d which he feels is helping.   INSOMNIA - already on Valium as noted.   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  6 month follow up--has not been  seen by the HA clinic yet. waiting on appt.  labs and ct [facility]. pt c/o being very tired all the time and pt feels like the dialysis is pulling too much off of him.  Review of Systems Constitutional:   No  weight loss, night sweats,  Fevers, chills,  +fatigue, or  lassitude.  HEENT:  ,  Difficulty swallowing,  Tooth/dental problems, or  Sore throat,                No sneezing, itching, ear ache, nasal congestion, post nasal drip,   CV:  No chest pain,  Orthopnea, PND, swelling in lower extremities, anasarca, dizziness, palpitations, syncope.   GI  No heartburn, indigestion, abdominal pain, nausea, vomiting, diarrhea, change in bowel habits, loss of appetite, bloody stools.   Resp: No shortness of breath with exertion or at rest.  No excess mucus, no productive cough, ,  No coughing up of blood.  No change in color of mucus.  No wheezing.  No chest wall deformity  Skin: skin lesion on hand (hx of porphyria cutanea tarda)   GU: no dysuria,   MS:  No joint pain or swelling.  No decreased range of motion.    Psych:  No change in mood or affect .  No memory loss.          Objective:   Physical Exam GEN: A/Ox3; pleasant , NAD, thin , frail and chronically ill appearing, appears older than stated age.   HEENT:  /AT,  EACs-clear, TMs-wnl, NOSE-clear, THROAT-clear, no lesions, no postnasal drip or exudate noted.   NECK:  Supple w/ fair ROM; no JVD; normal carotid impulses w/o bruits; no thyromegaly or nodules palpated; no lymphadenopathy.  RESP  Coarse BS w/  w/o, wheezes/ rales/ or rhonchi.no accessory muscle use, no dullness to percussion  CARD:  RRR, no m/r/g  , no peripheral edema, pulses intact, no cyanosis or clubbing. HD graft left upper arm.   GI:   Soft & nt; nml bowel sounds; no organomegaly or masses detected.  Musco: Warm bil, no deformities or joint swelling noted.   Neuro: alert, no focal deficits noted.    Skin: Warm,  few small circular open sores along hands. No signifcant redness noted.  Several tatoos.          Assessment & Plan:

## 2011-04-17 NOTE — Progress Notes (Signed)
Quick Note:  Called spoke with patient's ex-wife/caregiver, Clotilde Dieter, and informed her of pt's lab results / recs as stated by TP. Rosa verbalized her understanding. ______

## 2011-04-17 NOTE — Assessment & Plan Note (Signed)
Stress reducers  Pain meds refilled  Refer to headache clinic

## 2011-04-17 NOTE — Patient Instructions (Signed)
Continue on current regimen.  follow up Dr. Kriste Basque  In 4 months and As needed   I will call with lab work.  We are setting up headache clinic referral again.

## 2011-04-17 NOTE — Assessment & Plan Note (Signed)
Advised on diet  Labs pending.

## 2011-04-17 NOTE — Assessment & Plan Note (Signed)
Labs pending.  

## 2011-04-17 NOTE — Assessment & Plan Note (Signed)
Continue with follow up with Dr. Kathrene Bongo

## 2011-04-17 NOTE — Assessment & Plan Note (Signed)
Controlled on rx  Cont current regimen

## 2011-04-27 ENCOUNTER — Ambulatory Visit: Payer: Self-pay | Admitting: Pulmonary Disease

## 2011-05-12 ENCOUNTER — Telehealth: Payer: Self-pay | Admitting: Pulmonary Disease

## 2011-05-12 ENCOUNTER — Other Ambulatory Visit: Payer: Self-pay | Admitting: *Deleted

## 2011-05-12 MED ORDER — HYDROCODONE-ACETAMINOPHEN 10-325 MG PO TABS: 1.0000 | ORAL_TABLET | Freq: Four times a day (QID) | ORAL | Status: DC | PRN

## 2011-05-12 NOTE — Telephone Encounter (Signed)
Last rx written on 04-17-11 #90. Pt last ov on 04-17-11. Reill sent. Pt aware. [name], CMA

## 2011-05-12 NOTE — Telephone Encounter (Signed)
Opened in Error.

## 2011-06-11 ENCOUNTER — Other Ambulatory Visit: Payer: Self-pay | Admitting: *Deleted

## 2011-06-11 MED ORDER — METOPROLOL TARTRATE 50 MG PO TABS: 50.0000 mg | ORAL_TABLET | Freq: Two times a day (BID) | ORAL | Status: DC

## 2011-06-17 ENCOUNTER — Telehealth: Payer: Self-pay | Admitting: Pulmonary Disease

## 2011-06-17 MED ORDER — HYDROCODONE-ACETAMINOPHEN 10-325 MG PO TABS: 1.0000 | ORAL_TABLET | Freq: Four times a day (QID) | ORAL | Status: DC | PRN

## 2011-06-17 NOTE — Telephone Encounter (Signed)
Refill of pain med has been called to the pharmacy per pts request.  Called and spoke with pt and he is aware of meds called to the pharmacy

## 2011-07-01 ENCOUNTER — Telehealth: Payer: Self-pay | Admitting: Pulmonary Disease

## 2011-07-01 NOTE — Telephone Encounter (Signed)
Spoke with pt and he states he went to the pain clinic for his right hand pain and states they advised him their was no cure for it. Pt states they advised him he needed to f/u with his pcp for medication for his pain. Pt is requesting recs on what to do. Pt states he does not know where his hydrocodone is. Please advise Dr. Kriste Basque. Thanks Carver Fila, CMA

## 2011-07-02 MED ORDER — HYDROCODONE-ACETAMINOPHEN 10-325 MG PO TABS: 1.0000 | ORAL_TABLET | Freq: Four times a day (QID) | ORAL | Status: DC | PRN

## 2011-07-02 NOTE — Telephone Encounter (Signed)
I spoke with pt and he states he is having hand pain. Per leigh we can still send norco for pt. Pt aware of directions. Rx was called into pharmacy

## 2011-07-02 NOTE — Telephone Encounter (Signed)
Per SN---is it hand pain?  Or head pain?   We had sent him to the pain clinic for headaches.   The norco was called in on 06/07/11--  Can call in norco 10/325  #90  1 po tid--do not exceed 3 per day.  thanks

## 2011-07-03 ENCOUNTER — Other Ambulatory Visit: Payer: Self-pay | Admitting: *Deleted

## 2011-07-03 ENCOUNTER — Telehealth: Payer: Self-pay | Admitting: Pulmonary Disease

## 2011-07-03 MED ORDER — ESCITALOPRAM OXALATE 20 MG PO TABS: 20.0000 mg | ORAL_TABLET | Freq: Every day | ORAL | Status: DC

## 2011-07-03 NOTE — Telephone Encounter (Signed)
Spoke with pharmacist at [facility] and them of the rx. Pt aware as well

## 2011-07-16 ENCOUNTER — Ambulatory Visit: Payer: Medicare Other | Admitting: Pulmonary Disease

## 2011-07-31 LAB — CBC
HCT: 30.3 — ABNORMAL LOW
HCT: 34.1 — ABNORMAL LOW
Hemoglobin: 10.4 — ABNORMAL LOW
Hemoglobin: 11.7 — ABNORMAL LOW
MCHC: 34.3
MCV: 96.6
MCV: 96.7
MCV: 97
Platelets: 283
Platelets: 288
Platelets: 346
RBC: 3.13 — ABNORMAL LOW
RDW: 13.7
WBC: 7.3
WBC: 7.7

## 2011-07-31 LAB — PROTEIN ELECTROPHORESIS, SERUM
Albumin ELP: 54.2 — ABNORMAL LOW
Alpha-1-Globulin: 6 — ABNORMAL HIGH
Alpha-2-Globulin: 20.6 — ABNORMAL HIGH
Beta 2: 4.4
Gamma Globulin: 8.1 — ABNORMAL LOW

## 2011-07-31 LAB — COMPREHENSIVE METABOLIC PANEL
Albumin: 2.6 — ABNORMAL LOW
Alkaline Phosphatase: 120 — ABNORMAL HIGH
BUN: 31 — ABNORMAL HIGH
Creatinine, Ser: 1.81 — ABNORMAL HIGH
Glucose, Bld: 97
Potassium: 6.2 — ABNORMAL HIGH
Total Protein: 5.3 — ABNORMAL LOW

## 2011-07-31 LAB — PROTIME-INR
INR: 0.9
Prothrombin Time: 11.9

## 2011-07-31 LAB — CROSSMATCH: Antibody Screen: NEGATIVE

## 2011-07-31 LAB — DIFFERENTIAL
Basophils Absolute: 0.2 — ABNORMAL HIGH
Basophils Relative: 2 — ABNORMAL HIGH
Lymphocytes Relative: 35
Monocytes Relative: 13 — ABNORMAL HIGH
Neutro Abs: 4.5
Neutrophils Relative %: 46

## 2011-07-31 LAB — BLEEDING TIME: Bleeding Time: 6.5

## 2011-07-31 LAB — POTASSIUM: Potassium: 5

## 2011-08-04 ENCOUNTER — Ambulatory Visit (INDEPENDENT_AMBULATORY_CARE_PROVIDER_SITE_OTHER): Payer: Medicare Other | Admitting: Pulmonary Disease

## 2011-08-04 DIAGNOSIS — F411 Generalized anxiety disorder: Secondary | ICD-10-CM

## 2011-08-04 DIAGNOSIS — F329 Major depressive disorder, single episode, unspecified: Secondary | ICD-10-CM

## 2011-08-04 DIAGNOSIS — N186 End stage renal disease: Secondary | ICD-10-CM

## 2011-08-04 DIAGNOSIS — E039 Hypothyroidism, unspecified: Secondary | ICD-10-CM

## 2011-08-04 DIAGNOSIS — B1710 Acute hepatitis C without hepatic coma: Secondary | ICD-10-CM

## 2011-08-04 DIAGNOSIS — D638 Anemia in other chronic diseases classified elsewhere: Secondary | ICD-10-CM

## 2011-08-04 DIAGNOSIS — J209 Acute bronchitis, unspecified: Secondary | ICD-10-CM

## 2011-08-04 DIAGNOSIS — I10 Essential (primary) hypertension: Secondary | ICD-10-CM

## 2011-08-04 MED ORDER — HYDROCODONE-ACETAMINOPHEN 10-325 MG PO TABS: 1.0000 | ORAL_TABLET | Freq: Four times a day (QID) | ORAL | Status: DC | PRN

## 2011-08-04 NOTE — Patient Instructions (Signed)
Today we updated your med list in EPIC...    We refilled your [facility] per request...  We will check to see who would be an appropriate second opinion regarding your Porphyria...  Let's plan a follow up visit in 4 months w/ FASTING blood work at that time.Marland KitchenMarland Kitchen

## 2011-08-07 ENCOUNTER — Other Ambulatory Visit: Payer: Self-pay | Admitting: *Deleted

## 2011-08-07 MED ORDER — ESCITALOPRAM OXALATE 20 MG PO TABS: 20.0000 mg | ORAL_TABLET | Freq: Every day | ORAL | Status: DC

## 2011-08-16 ENCOUNTER — Encounter: Payer: Self-pay | Admitting: Pulmonary Disease

## 2011-08-16 NOTE — Progress Notes (Signed)
Subjective:    Patient ID: Sean [name], male    DOB: 18-Oct-1961, 50 y.o.   MRN: [id number]  HPI 50 y/o WM here for a follow up visit... he has multiple medical problems as noted below including Chronic HepC prev [facility], & Chronic renal failure now on Dialysis from DrGoldsborough et al...  ~  Jun10:  BP meds adjusted by DrGoldsborough, and she started Simvastatin as well for his Chol... still smoking 1ppd & can't vs won't quit... HepC clinic has been following his TSH and he reports "it's leveled out &  I don't need medication"... he is here today primarily to get a perscription for his Percocet10's- taking 1 Bid... ~  Dec10:  he has had another eventful 43mo- he is now off the [facility] therapy as it was not helpful w/ viral titers not responding... [facility] told him there may be some new treatment avail in 2011 & he is holding hope but they have not sched any follow up w/ him... he remains in the Nephrology clinic w/ DrGoldsborough every 76mo- and his Creat has risen from 2.2.5 to 3.6 over the last 5 months... they discussed dialysis at their last visit... he tells me that he is now under the care of a Neurologist in [place] for his seizure disorder & HA's... still smoking 1ppd- denies resp problems, and CXR 11/10 showed chr changes, NAD... 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 he states nerves & depression are doing satis on LEXAPRO 20mg /d & Valium 10mg Bid... he has lost weight down to 148# & prognosis is guarded at best.  ~  Mar 12, 2010:  he was hosp 4/26 - 03/04/10 w/ severe anemia, progression to end stage renal disease & started on dialysis.Marland Kitchen. off BP med now due to low BP, and fluid status regulated thru dialysis center... also gets Aranesp & Venofer for anemia (+4u Tx in hosp) from the Nephrologists... GI eval w/ EGD showing enlarged gastric fold, no varices, no  bleeding sites; Colonoscopy showed divertics, hems, & mult adenomatous polyps removed (f/u rec 22yr), stool for occult blood was neg... still smoking 1ppd and can't vs won't quit, he refuses smoking cessation help or Chantix Rx...  he has an abd wall hernia which he claims is quite painful & for which he was give Percocet10 in the hosp w/ consult from DrWyatt, CCS w/ outpt surg pending...  he tells me that he finished the Methadone program 3/11 & has been off all narcs until this hosp...  here for refill of Percocet10 & wants to switch Valium10 for Xanax1mg .  ~  May 01, 2010:  add-on for "stomach problem" meaning diarrhea he says & unable to finish dialysis due to diarrhea... notes this all started after his hernia repair in 04/02/10- lap ventral hernia repair w/ mesh & lysis of adhesions by DrWakefield... hx remote Nissen & prev HPylori Rx... he had EGD & Colon recently by GI- Arlyce Dice & Marina Goodell- see above... we discussed poss IBS & Rx w/ Bentyl + fiber w/ GI follow up recommended...  Also c/o "need more Valium" on 5mg  Tid for nerves & he wants 10mg Tid due to dialysis- I told him this was the max.  ~  October 29, 2010:  he has been skipping his dialysis freq since he claims it causes severe HAs (averages 1-2 per week) & he was hosp 9/11 w/ VDRF due  to vol overload when he missed dialysis in Sep... he claims that Nephrology won't help him w/ the HA problem (although DrPatel & DrWebb gave him Oxycodone in Nov & Dec); and the [facility] group [facility]) refused to see him for the HA prob (they see him for his seizure hx)... we provide him w/ Valium 10mg - 3 per day, and narcotic pain med> prev Vicodin now Norco 10/325- 3 per day... we will refer him to a [place] HA management clinic for further eval & Rx...  still smoking 1ppd & has no interest in smoking cessation help;  BP stable & managed by Dialysis;  Chol Rx w/ Simva20 per DrGoldsborough;  GI stable w/ known HepC, uses OTC PPI Prn;  due for f/u labs  today...  ~  August 04, 2011:  37mo ROV> he returns c/o increased pain & wants more pain meds; states the doctor in [place] ?Neurology ?pain doctor (we don't have any notes & pt is told to get notes for [place] to review) said it was from his Porphyria & there is nothing to do for it except incr pain meds; also c/o HAs w/ the dialysis & he says Nephrology won't give him anything for it; he is already on my max meds w/ [facility] 10/325 Tid & VALIUM 10mg  Tids (of course he didn't bring med list or med bottles to review)... I explained to him that I could not give him any more pain meds & that he will need to seek relieve for his non-malignant pain thru a Pain Clinic... MULT MEDICAL PROBLEMS AS LISTED BELOW>>    Current Problems:   Hx of SINUSITIS (ICD-473.9) - ENT eval DrShoemaker 2/09 was neg... no complaints or congestion/ drainage/ etc...  CIGARETTE SMOKER (ICD-305.1) - still smoking >1ppd... he knows that he must quit smoking... discussed smoking cessation strategies including cessation programs, counselling, nicotine replacement, and Chantix receptor blockade... the pt is not interested at this time but we left the door open should she like to reconsider at any time.  Hx of ASTHMATIC BRONCHITIS, ACUTE (ICD-466.0) - no recent AB exacerbations... ~  CXR 11/10 showed chronic changes but NAD.Marland Kitchen. ~  CXR 4/11 showed chr bronchitic markings, NAD... ~  HOSP 9/11 w/ resp fail req vent due to vol overload from skipping dialysis...  HYPERTENSION (ICD-401.9) - prev controlled on combination of Norvasc10, & Furosemide40; Nephrology controls BP now w/ dialysis.Marland Kitchen. off all BP meds since 5/11 discharge & on dialysis now> BP=180/82 today, he is weak, pale, & chr ill appearing...  HYPERCHOLESTEROLEMIA (ICD-272.0) - started on SIMVASTATIN 20mg /d by DrGoldsborough... ?labs by Nephrology. ~  FLP 6/12 showed TChol 174, TG 296, HDL 42, LDL 84 ~  10/12:  He reports off Simvastatin at this time  HYPOTHYROIDISM, BORDERLINE  (ICD-244.9) - labs prev checked frequently from the [facility] showed sl elevated TSH in the 5-6 range... he was tried on LEVOTHYROID 28mcg/d but states he developed HA & was INTOL therefore stopped after only a few doses... ~  labs 1/10 here showed TSH= 4.19, FreeT3= 2.2 (2.3-4.2), & FreeT4= 0.4 (0.6-1.6)... Levothy50 started. ~  labs 3/10 from [facility] showed TSH= 6.57... rec> re-start Levothy50- 1/2 daily. ~  pt states that Levothy was stopped in the interim- TSH 11/10 off med= 4.49 ~  labs 6/12 showed TSH= 3.08  GERD (ICD-530.81) - Hx of prev Nissen fundoplcation... prev HPylori Rx. ~  3/10:  given Zofran for nausea by the [facility]... ~  EGD 5/11 by DrKaplan showed enlarged gastric folds,  no varices, no bleeding sites... on PROTONIX 40mg Bid at disch from hosp, but he stopped on his own...  DIVERTICULOSIS OF COLON (ICD-562.10) & COLONIC POLYPS (ICD-211.3) ~  Colonoscopy 5/11 DrPerry showed divertics, hems, 7 polypys= adenomatous, no bleeding sites... f/u rec 43yr w/ DrPatterson.  HEPATITIS C (ICD-070.51) - Hx of prev IV drug abuse, and mult tatoos... he has known HepC w/ mild cirrhosis and chr active hepatitis on liver Bx 8/08...reviewed by DrPatterson for GI 8/08 and referred to the [facility]... he began PEGASYS & RIBAVIRIN 12/07/08 via the clinic and he was followed weekly> ~  last clinic note of 04/09/09 reviewed- viral titer increased slightly (may be developing resistance). ~  he was taken off therapy 7/10- rising viral titers, no option for other Rx at this time... he indicates that they were hopeful that some new Rx would be avail in 2011...  RENAL FAILURE, END STAGE (ICD-585.6) - found to have nephrotic range proteinuria, microscopic hematuria, and Creat= 1.45 w/ eval by DrGoldsborough for Nephrology including a renal biopsy showing Fibrillary GN, mod interstitial fibrosis, and tubular atrophy- stage 3 chronic kidney disease (no spec therapy avail- Rx for his HepC may help)...  since then he has progressed to ESRD & started on dialysis. ~  labs from [facility] 3/10 showed BUN= 35, Creat= 2.24, K= 5.9.Marland Kitchen. he was started on LASIX by Nephrology. ~  note from [facility] 03/12/09 reviewed- stable RI, BP meds adjusted. ~  labs from [facility] 6/10 showed Creat= 2.18, K= 4.8, HCO3= 14 ~  labs 11/10 showed BUN= 33, Creat= 3.6, K=4.1, HCO3= 23 ~  labs from 5/11 hosp showed BUN=53, Creat=5.21, K= 5.0, HCO3=16, Ca=7.2, Alb=2.0, PTH=339 ~  5/11:  started on dialysis by Nephrology & they do his labs now...  Hx of SEIZURE DISORDER (ICD-780.39) - as noted- prev followed at [facility] by DrO'Donovan and is taking CARBITROL 600mg Bid... he is on disability because of his seizure history... now followed at Neurology office in [place].  HEADACHE, MIXED (ICD-784.0) - he was referred to the [facility] after his appt 3/09 w/ severe HA he thought were "clusters"... pt never showed for the appt--- DrGoldsborough gave him Vicodin to use Prn (she doesn't want him on NSAIDs etc)... ~  6/10: he requests Percocet10 one Bid as nothing else helps his pain... I told him we would fill #60, no early refills, no other narcotics from anyone else or we will insist on his seeking help from a chr pain clinic for management... ~  12/10: he is on a chr METHADONE program via [facility]" where he has to go every day for his dose. ~  5/11: he tells me that he finished the methadone clinic program & was off narcotics until 5/11 hosp w/ pain from abd wall hernia. ~  12/11:  now c/o severe HAs that he blames on his dialysis- freq skips treatment & ave 1/wk; he claims the [place] [facility] clinic in [place] will not see him for the HA prob; we fill rx for [facility] 10/325 Tid & will refer him to the HA management clinic for assessment.  ANXIETY (ICD-300.00) - he has mod anxiety and takes VALIUM 10mg Tid (max allowed dose). DEPRESSION (ICD-311) - on LEXAPRO 20mg /d which he feels is helping. INSOMNIA - already on Valium as  noted.  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 He is currently on Norco 10/325 Tid & Valium 10Tid...  ANEMIA of CHRONIC DISEASE - he was on Procrit shots from the [facility]- now followed by [facility], Nephrology.   Past Surgical History  Procedure Date  . Inguinal hernia repair 1973  . Orchiopexy 1973  . Nissen fundoplication 99  . Craniotomy 1978    skull fracture  . Av fistula repair 5-11    Dr. Arbie Cookey    Outpatient Encounter Prescriptions as of 08/04/2011  Medication Sig Dispense Refill  . b complex-vitamin c-folic acid (NEPHRO-VITE) 0.8 MG TABS Take 0.8 mg by mouth at bedtime.        . Calcium Acetate 667 MG TABS Take 1 tablet by mouth 3 (three) times daily.        . calcium carbonate (TUMS - DOSED IN MG ELEMENTAL CALCIUM) 500 MG chewable tablet Chew 1 tablet by mouth as needed.        . carbamazepine (CARBATROL) 300 MG 12 hr capsule Take 300 mg by mouth 2 (two) times daily.        . darbepoetin (ARANESP, ALBUMIN FREE,) 200 MCG/0.4ML SOLN Inject 200 mcg into the skin. Per Nephrology       . diazepam (VALIUM) 10 MG tablet Take 10 mg by mouth 3 (three) times daily as needed. As needed for nerves       . dicyclomine (BENTYL) 20 MG tablet Take 20 mg by mouth 3 (three) times daily as needed.        Marland Kitchen HYDROcodone-acetaminophen (NORCO) 10-325 MG per tablet Take 1 tablet by mouth every 6 (six) hours as needed. DO NOT EXCEED  3 PER DAY  90 tablet  5  . DISCONTD: escitalopram (LEXAPRO) 20 MG tablet Take 1 tablet (20 mg total) by mouth daily.  30 tablet  3  . DISCONTD: HYDROcodone-acetaminophen (NORCO) 10-325 MG per tablet Take 1 tablet by mouth every 6 (six) hours as needed. DO NOT EXCEED  3 PER DAY  90 tablet  0  . DISCONTD: iron sucrose (VENOFER) 20 MG/ML injection Inject 100 mg into the  vein. Per nephrology       . DISCONTD: metoprolol (LOPRESSOR) 50 MG tablet Take 1 tablet (50 mg total) by mouth 2 (two) times daily.  60 tablet  11  . DISCONTD: simvastatin (ZOCOR) 20 MG tablet Take 20 mg by mouth at bedtime.          Allergies  Allergen Reactions  . Iron     Causes pt to bruise easily    Current Medications, Allergies, Past Medical History, Past Surgical History, Family History, and Social History were reviewed in [employer] record.    Review of Systems        See HPI - all other systems neg except as noted... The patient complains of weight loss, decreased hearing, dyspnea on exertion, headaches, and muscle weakness.  The patient denies anorexia, fever, weight gain, vision loss, hoarseness, chest pain, syncope, peripheral edema, prolonged cough, hemoptysis, abdominal pain, melena, hematochezia, severe indigestion/heartburn, hematuria, incontinence, suspicious skin lesions, transient blindness, difficulty walking, depression, unusual weight change, abnormal bleeding, enlarged lymph nodes, and angioedema.     Objective:   Physical Exam    Thin, pale, 50 y/o WM who appears chronically ill... GENERAL:  Alert & oriented; pleasant & cooperative... teeth in poor repair... HEENT:  [place]/AT, EOM-wnl, PERRLA, EACs-clear, TMs-wnl, NOSE-clear, THROAT-clear & wnl. NECK:  Supple w/ fairROM; no JVD; normal carotid  impulses w/o bruits; no thyromegaly or nodules palpated; no lymphadenopathy. CHEST:  Clear to P & A; without wheezes/ rales/ or rhonchi heard. HEART:  Regular Rhythm; gr 2/6 SEM, w/o rubs or gallops detected. ABDOMEN:  Soft & nontender; incr bowel sounds; no organomegaly or masses palpated. EXT: without deformities, mild arthritic changes; no varicose veins/ venous insuffic/ or edema, no asterixis. NEURO:  CN's intact; no focal neuro deficits... DERM: Pale complexion, no lesions noted; mult tatoos...  RADIOLOGY DATA:  Reviewed in the EPIC EMR &  discussed w/ the patient...  LABORATORY DATA:  Reviewed in the EPIC EMR & discussed w/ the patient...   Assessment & Plan:   Chronic Pain Patient>  I told him this needs to be managed in a certified pain clinic just like when he was on Methadone for drug abuse problem...  Chronic Renal Failure on Dialysis> managed by Nephrology...  Cig Smoker/ AB>  He has underlying COPD/ Chr Bronchitis & won't quit smoking, not motivated & refuses smoking cessation help...  HBP>  Off prev Metoprolol per Nephrology & they control his BP via dialysis...  Lipids>  He was placed on Simva20 per DrGoldsborough but now off this med...  Hypothyroid>  He remains biochem euthyroid off his prev med...  GI>  HepC, GERD, Divertics, Polyps> followed by [facility] Drs Marina Goodell, Robbi Garter...  Seizure Disorder>  States he is on Carbatrol per Neurology in [place]...  HAs>  He says exac by Dialysis but neither Nephrology nor Neurolog will help him w/ his HAs... Refer to Pain Management...  Anxiety/ Depression> he insists on Valium 10mg Tid; also takes Lexapro 20mg /d...  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

## 2011-08-17 ENCOUNTER — Other Ambulatory Visit: Payer: Self-pay | Admitting: Pulmonary Disease

## 2011-08-17 DIAGNOSIS — N186 End stage renal disease: Secondary | ICD-10-CM

## 2011-08-17 DIAGNOSIS — B1710 Acute hepatitis C without hepatic coma: Secondary | ICD-10-CM

## 2011-08-17 LAB — CBC
HCT: 37.5 — ABNORMAL LOW
Hemoglobin: 13.1
MCHC: 34.9
MCV: 93.7
Platelets: 390
RDW: 12.9

## 2011-10-13 ENCOUNTER — Other Ambulatory Visit: Payer: Self-pay | Admitting: *Deleted

## 2011-10-13 MED ORDER — DIAZEPAM 10 MG PO TABS: 10.0000 mg | ORAL_TABLET | Freq: Three times a day (TID) | ORAL | Status: DC | PRN

## 2011-11-15 ENCOUNTER — Inpatient Hospital Stay (HOSPITAL_COMMUNITY)
Admission: EM | Admit: 2011-11-15 | Discharge: 2011-11-16 | DRG: 189 | Disposition: A | Discharge status: Home Health | Payer: Medicare Other | Source: Other Acute Inpatient Hospital | Attending: Internal Medicine | Admitting: Internal Medicine

## 2011-11-15 ENCOUNTER — Encounter (HOSPITAL_COMMUNITY): Payer: Self-pay | Admitting: Pulmonary Disease

## 2011-11-15 ENCOUNTER — Inpatient Hospital Stay (HOSPITAL_COMMUNITY): Payer: Medicare Other

## 2011-11-15 DIAGNOSIS — I10 Essential (primary) hypertension: Secondary | ICD-10-CM | POA: Diagnosis present

## 2011-11-15 DIAGNOSIS — N186 End stage renal disease: Secondary | ICD-10-CM | POA: Diagnosis present

## 2011-11-15 DIAGNOSIS — I120 Hypertensive chronic kidney disease with stage 5 chronic kidney disease or end stage renal disease: Secondary | ICD-10-CM | POA: Diagnosis present

## 2011-11-15 DIAGNOSIS — J329 Chronic sinusitis, unspecified: Secondary | ICD-10-CM | POA: Diagnosis present

## 2011-11-15 DIAGNOSIS — Z888 Allergy status to other drugs, medicaments and biological substances status: Secondary | ICD-10-CM

## 2011-11-15 DIAGNOSIS — Z9189 Other specified personal risk factors, not elsewhere classified: Secondary | ICD-10-CM

## 2011-11-15 DIAGNOSIS — K5730 Diverticulosis of large intestine without perforation or abscess without bleeding: Secondary | ICD-10-CM

## 2011-11-15 DIAGNOSIS — D638 Anemia in other chronic diseases classified elsewhere: Secondary | ICD-10-CM | POA: Diagnosis present

## 2011-11-15 DIAGNOSIS — F17200 Nicotine dependence, unspecified, uncomplicated: Secondary | ICD-10-CM | POA: Diagnosis present

## 2011-11-15 DIAGNOSIS — R569 Unspecified convulsions: Secondary | ICD-10-CM | POA: Diagnosis present

## 2011-11-15 DIAGNOSIS — Z860100 Personal history of colon polyps, unspecified: Secondary | ICD-10-CM

## 2011-11-15 DIAGNOSIS — Z91199 Patient's noncompliance with other medical treatment and regimen due to unspecified reason: Secondary | ICD-10-CM

## 2011-11-15 DIAGNOSIS — R197 Diarrhea, unspecified: Secondary | ICD-10-CM

## 2011-11-15 DIAGNOSIS — N189 Chronic kidney disease, unspecified: Secondary | ICD-10-CM

## 2011-11-15 DIAGNOSIS — F1910 Other psychoactive substance abuse, uncomplicated: Secondary | ICD-10-CM | POA: Diagnosis present

## 2011-11-15 DIAGNOSIS — J810 Acute pulmonary edema: Secondary | ICD-10-CM

## 2011-11-15 DIAGNOSIS — J9600 Acute respiratory failure, unspecified whether with hypoxia or hypercapnia: Principal | ICD-10-CM | POA: Diagnosis present

## 2011-11-15 DIAGNOSIS — E039 Hypothyroidism, unspecified: Secondary | ICD-10-CM | POA: Diagnosis present

## 2011-11-15 DIAGNOSIS — F329 Major depressive disorder, single episode, unspecified: Secondary | ICD-10-CM

## 2011-11-15 DIAGNOSIS — D72829 Elevated white blood cell count, unspecified: Secondary | ICD-10-CM | POA: Diagnosis present

## 2011-11-15 DIAGNOSIS — Z9119 Patient's noncompliance with other medical treatment and regimen: Secondary | ICD-10-CM

## 2011-11-15 DIAGNOSIS — E7800 Pure hypercholesterolemia, unspecified: Secondary | ICD-10-CM | POA: Diagnosis present

## 2011-11-15 DIAGNOSIS — E872 Acidosis, unspecified: Secondary | ICD-10-CM | POA: Diagnosis present

## 2011-11-15 DIAGNOSIS — B1710 Acute hepatitis C without hepatic coma: Secondary | ICD-10-CM | POA: Diagnosis present

## 2011-11-15 DIAGNOSIS — J811 Chronic pulmonary edema: Secondary | ICD-10-CM | POA: Diagnosis present

## 2011-11-15 DIAGNOSIS — Z79899 Other long term (current) drug therapy: Secondary | ICD-10-CM

## 2011-11-15 DIAGNOSIS — B1920 Unspecified viral hepatitis C without hepatic coma: Secondary | ICD-10-CM | POA: Diagnosis present

## 2011-11-15 DIAGNOSIS — E875 Hyperkalemia: Secondary | ICD-10-CM | POA: Diagnosis present

## 2011-11-15 DIAGNOSIS — K219 Gastro-esophageal reflux disease without esophagitis: Secondary | ICD-10-CM | POA: Diagnosis present

## 2011-11-15 DIAGNOSIS — G40909 Epilepsy, unspecified, not intractable, without status epilepticus: Secondary | ICD-10-CM | POA: Diagnosis present

## 2011-11-15 DIAGNOSIS — N179 Acute kidney failure, unspecified: Secondary | ICD-10-CM | POA: Diagnosis present

## 2011-11-15 DIAGNOSIS — R51 Headache: Secondary | ICD-10-CM

## 2011-11-15 DIAGNOSIS — F411 Generalized anxiety disorder: Secondary | ICD-10-CM | POA: Diagnosis present

## 2011-11-15 DIAGNOSIS — F3289 Other specified depressive episodes: Secondary | ICD-10-CM | POA: Diagnosis present

## 2011-11-15 DIAGNOSIS — D631 Anemia in chronic kidney disease: Secondary | ICD-10-CM

## 2011-11-15 LAB — LACTIC ACID, PLASMA: Lactic Acid, Venous: 1 mmol/L (ref 0.5–2.2)

## 2011-11-15 LAB — CULTURE, BLOOD (ROUTINE X 2)
Culture  Setup Time: 201301131733
Culture  Setup Time: 201301131732

## 2011-11-15 LAB — CBC
HCT: 27.2 % — ABNORMAL LOW (ref 39.0–52.0)
RBC: 2.81 MIL/uL — ABNORMAL LOW (ref 4.22–5.81)
RDW: 14.6 % (ref 11.5–15.5)
WBC: 9.9 10*3/uL (ref 4.0–10.5)

## 2011-11-15 LAB — CREATININE, SERUM
GFR calc Af Amer: 15 mL/min — ABNORMAL LOW (ref >90)
GFR calc non Af Amer: 13 mL/min — ABNORMAL LOW (ref >90)

## 2011-11-15 LAB — PROCALCITONIN: Procalcitonin: 1.89 ng/mL

## 2011-11-15 LAB — PRO B NATRIURETIC PEPTIDE: Pro B Natriuretic peptide (BNP): >70000 pg/mL — ABNORMAL HIGH (ref 0–125)

## 2011-11-15 MED ORDER — METOPROLOL TARTRATE 50 MG PO TABS
50.0000 mg | ORAL_TABLET | Freq: Two times a day (BID) | ORAL | Status: DC
  Administered 2011-11-15 – 2011-11-16 (×3): 50 mg via ORAL
  Filled 2011-11-15 (×4): qty 1

## 2011-11-15 MED ORDER — RENA-VITE PO TABS
1.0000 | ORAL_TABLET | Freq: Every day | ORAL | Status: DC
  Administered 2011-11-15 – 2011-11-16 (×2): 1 via ORAL
  Filled 2011-11-15 (×2): qty 1

## 2011-11-15 MED ORDER — CALCIUM ACETATE 667 MG PO CAPS
667.0000 mg | ORAL_CAPSULE | Freq: Three times a day (TID) | ORAL | Status: DC
  Administered 2011-11-15 – 2011-11-16 (×3): 667 mg via ORAL
  Filled 2011-11-15 (×6): qty 1

## 2011-11-15 MED ORDER — SODIUM CHLORIDE 0.9 % IV SOLN
INTRAVENOUS | Status: DC
  Administered 2011-11-15: 11:00:00 via INTRAVENOUS

## 2011-11-15 MED ORDER — ASPIRIN 300 MG RE SUPP
300.0000 mg | RECTAL | Status: AC
  Filled 2011-11-15: qty 1

## 2011-11-15 MED ORDER — PANTOPRAZOLE SODIUM 40 MG PO TBEC
40.0000 mg | DELAYED_RELEASE_TABLET | Freq: Every day | ORAL | Status: DC
  Administered 2011-11-15 – 2011-11-16 (×2): 40 mg via ORAL
  Filled 2011-11-15 (×2): qty 1

## 2011-11-15 MED ORDER — HYDRALAZINE HCL 20 MG/ML IJ SOLN
10.0000 mg | Freq: Four times a day (QID) | INTRAMUSCULAR | Status: DC | PRN
  Filled 2011-11-15: qty 2

## 2011-11-15 MED ORDER — ESCITALOPRAM OXALATE 20 MG PO TABS
20.0000 mg | ORAL_TABLET | Freq: Every day | ORAL | Status: DC
  Administered 2011-11-15 – 2011-11-16 (×2): 20 mg via ORAL
  Filled 2011-11-15 (×2): qty 1

## 2011-11-15 MED ORDER — HEPARIN SODIUM (PORCINE) 5000 UNIT/ML IJ SOLN
5000.0000 [IU] | Freq: Three times a day (TID) | INTRAMUSCULAR | Status: DC
  Administered 2011-11-15 – 2011-11-16 (×4): 5000 [IU] via SUBCUTANEOUS
  Filled 2011-11-15 (×6): qty 1

## 2011-11-15 MED ORDER — NITROGLYCERIN 2 % TD OINT
1.0000 [in_us] | TOPICAL_OINTMENT | Freq: Four times a day (QID) | TRANSDERMAL | Status: DC
  Administered 2011-11-15: 1 [in_us] via TOPICAL
  Filled 2011-11-15: qty 30

## 2011-11-15 MED ORDER — HYDROCODONE-ACETAMINOPHEN 10-325 MG PO TABS
1.0000 | ORAL_TABLET | Freq: Four times a day (QID) | ORAL | Status: DC | PRN
  Administered 2011-11-15 – 2011-11-16 (×2): 1 via ORAL
  Filled 2011-11-15 (×2): qty 1

## 2011-11-15 MED ORDER — AMLODIPINE BESYLATE 5 MG PO TABS
5.0000 mg | ORAL_TABLET | Freq: Every day | ORAL | Status: DC
  Filled 2011-11-15 (×2): qty 1

## 2011-11-15 MED ORDER — CARBAMAZEPINE ER 200 MG PO TB12
300.0000 mg | ORAL_TABLET | Freq: Two times a day (BID) | ORAL | Status: DC
  Administered 2011-11-15 – 2011-11-16 (×3): 300 mg via ORAL
  Filled 2011-11-15 (×4): qty 1

## 2011-11-15 MED ORDER — CALCIUM ACETATE 667 MG PO TABS: 1.0000 | ORAL_TABLET | Freq: Three times a day (TID) | ORAL | Status: DC

## 2011-11-15 MED ORDER — CARBAMAZEPINE ER 300 MG PO CP12: 300.0000 mg | ORAL_CAPSULE | Freq: Two times a day (BID) | ORAL | Status: DC

## 2011-11-15 MED ORDER — ASPIRIN 81 MG PO CHEW
324.0000 mg | CHEWABLE_TABLET | ORAL | Status: AC
  Administered 2011-11-15: 324 mg via ORAL
  Filled 2011-11-15: qty 4

## 2011-11-15 MED ORDER — SODIUM CHLORIDE 0.9 % IV SOLN: 250.0000 mL | INTRAVENOUS | Status: DC | PRN

## 2011-11-15 MED ORDER — SODIUM CHLORIDE 0.9 % IJ SOLN
3.0000 mL | Freq: Two times a day (BID) | INTRAMUSCULAR | Status: DC
  Administered 2011-11-15: 3 mL via INTRAVENOUS

## 2011-11-15 NOTE — Consult Note (Signed)
Date of Admission: 11/15/11 Reason for Consult: ESRD-DD, pulmonary edema and hyperkalemia in dialysis patient Referring Physician: Tyson Alias, PCCM   Sean [name] is an 51 y.o. male.  HPI: 51 yo M [place]D-DD [place] who presents as transfer from [facility] with active chest pain,  increased work of breathing and respiratory acidosis that improved with BiPAP.  His last complete dialysis session was Thursday 11/12/11 as he discontinued his dialysis session early yesterday. [facility] positive for mild cardiomegaly, pulmonary edema, pneumonia not excluded. Initial K+ 5.9 on slightly hemolyzed specimen. He was hypertensive and received labetalol 20 mg IV x 1. Additional history difficult to obtain as patient on BIPAP  He had a very similar presentation requiring hospital admission, requiring mechanical ventilation and emergent HD in April of 2012.          Trend in Creatinine: Creatinine, Ser  Date/Time Value Range Status  02/19/2011  3:20 AM 6.74* 0.4-1.5 (mg/dL) Final  1/61/0960  4:54 AM 5.18 DELTA CHECK NOTED* 0.4-1.5 (mg/dL) Final  0/98/1191  4:78 PM 9.05* 0.4-1.5 (mg/dL) Final  2/95/6213  0:86 AM 7.29* 0.4-1.5 (mg/dL) Final  5/78/4696  2:95 AM 6.17* 0.4-1.5 (mg/dL) Final  2/84/1324  4:01 AM 5.65* 0.4-1.5 (mg/dL) Final  0/27/2536  6:44 AM 5.55 DELTA CHECK NOTED* 0.4-1.5 (mg/dL) Final  0/34/7425  9:56 AM 3.35 DELTA CHECK NOTED* 0.4-1.5 (mg/dL) Final  3/87/5643  3:29 AM 7.22* 0.4-1.5 (mg/dL) Final  03/20/8415  6:06 PM 7.35* 0.4-1.5 (mg/dL) Final  12/31/6008  9:32 PM 7.55* 0.4-1.5 (mg/dL) Final  01/05/5731  2:02 AM 5.43* 0.4-1.5 (mg/dL) Final  03/05/2705  2:37 PM 4.20 DIALYSIS* 0.4-1.5 (mg/dL) Final  04/03/8314  1:76 AM 7.61* 0.4-1.5 (mg/dL) Final  11/08/735  1:06 PM 5.32* 0.4-1.5 (mg/dL) Final  12/09/9483  4:62 AM 4.29* 0.4-1.5 (mg/dL) Final  7/0/3500 93:81 AM 3.77* 0.4-1.5 (mg/dL) Final  06/03/9936  1:69 AM 4.11* 0.4-1.5 (mg/dL) Final  04/08/8937 10:17 AM  4.54* 0.4-1.5 (mg/dL) Final  03/02/257  5:27 AM 5.23* 0.4-1.5 (mg/dL) Final  7/82/4235  3:61 AM 5.11* 0.4-1.5 (mg/dL) Final  4/43/1540  0:86 AM 5.21* 0.4-1.5 (mg/dL) Final  7/61/9509  3:26 AM 5.06* 0.4-1.5 (mg/dL) Final  05/14/4579  9:98 AM 4.98* 0.4-1.5 (mg/dL) Final  3/38/2505 39:76 AM 5.02* 0.4-1.5 (mg/dL) Final  05/04/4192  7:90 AM 4.9* 0.4-1.5 (mg/dL) Final  24/07/7352  2:99 PM 3.6* 0.4-1.5 (mg/dL) Final  12/06/2681  4:19 AM 1.81*  Final  05/02/2007 11:51 AM 1.0  0.4-1.5 (mg/dL) Final    PMH:   Past Medical History  Diagnosis Date  . Paroxysmal ventricular tachycardia   . Headache   . Diarrhea   . Weight loss   . Unspecified sinusitis (chronic)   . Cigarette smoker   . Asthmatic bronchitis   . HTN (hypertension)   . Hypercholesterolemia   . Hypothyroidism   . GERD (gastroesophageal reflux disease)   . Diverticulosis   . Colonic polyp   . Hepatitis C   . History of drug abuse   . Renal failure   . Seizure disorder   . Anxiety   . Depression   . Anemia   . Disorders of porphyrin metabolism     PSH:   Past Surgical History  Procedure Date  . Inguinal hernia repair 1973  . Orchiopexy 1973  . Nissen fundoplication 99  . Craniotomy 1978    skull fracture  . Av fistula repair 5-11    Dr. Arbie Cookey    Allergies:  Allergies  Allergen  Reactions  . Iron     Causes pt to bruise easily    Medications:   Prescriptions prior to admission  Medication Sig Dispense Refill  . amLODipine (NORVASC) 5 MG tablet Take 5 mg by mouth daily.      Marland Kitchen b complex-vitamin c-folic acid (NEPHRO-VITE) 0.8 MG TABS Take 0.8 mg by mouth at bedtime.        . Calcium Acetate 667 MG TABS Take 1 tablet by mouth 3 (three) times daily.        . calcium carbonate (TUMS - DOSED IN MG ELEMENTAL CALCIUM) 500 MG chewable tablet Chew 1 tablet by mouth as needed.        . carbamazepine (CARBATROL) 300 MG 12 hr capsule Take 300 mg by mouth 2 (two) times daily.        . darbepoetin (ARANESP, ALBUMIN FREE,) 200  MCG/0.4ML SOLN Inject 200 mcg into the skin. Per Nephrology       . diazepam (VALIUM) 10 MG tablet Take 1 tablet (10 mg total) by mouth 3 (three) times daily as needed. As needed for nerves  90 tablet  5  . dicyclomine (BENTYL) 20 MG tablet Take 20 mg by mouth 3 (three) times daily as needed.        Marland Kitchen escitalopram (LEXAPRO) 20 MG tablet Take 1 tablet (20 mg total) by mouth daily.  30 tablet  3  . hydrOXYzine (ATARAX/VISTARIL) 25 MG tablet Take 25 mg by mouth every 6 (six) hours as needed.      . metoprolol (LOPRESSOR) 50 MG tablet Take 50 mg by mouth 2 (two) times daily.      . simvastatin (ZOCOR) 20 MG tablet Take 20 mg by mouth at bedtime.      Marland Kitchen DISCONTD: HYDROcodone-acetaminophen (NORCO) 10-325 MG per tablet Take 1 tablet by mouth every 6 (six) hours as needed. DO NOT EXCEED  3 PER DAY  90 tablet  5   Medications on tranfers: labetalol 20 mg IV x1.   Current Medications:   . amLODipine  5 mg Oral Daily  . aspirin  324 mg Oral NOW   Or  . aspirin  300 mg Rectal NOW  . calcium acetate  667 mg Oral TID WC  . carbamazepine  300 mg Oral BID  . escitalopram  20 mg Oral Daily  . heparin  5,000 Units Subcutaneous Q8H  . metoprolol tartrate  50 mg Oral BID  . multivitamin  1 tablet Oral Daily  . nitroGLYCERIN  1 inch Topical Q6H  . pantoprazole  40 mg Oral Q1200  . DISCONTD: Calcium Acetate  1 tablet Oral TID  . DISCONTD: carbamazepine  300 mg Oral BID  Infusions:    . sodium chloride 20 mL/hr at 11/15/11 1118  PRNs:  sodium chloride, HYDROcodone-acetaminophen, DISCONTD: hydrALAZINE  Social History:  unable to obtain.   Family History:   Family History  Problem Relation Age of Onset  . COPD Mother   . COPD Other     sibling    ROS: difficult to obtain on BiPAP. Patient admit to active chest tighness and fatigue.  Physical Exam:  Blood pressure 183/91, pulse 68, temperature 97.9 F (36.6 C), temperature source Oral, resp. rate 21, height 5\' 11"  (1.803 m), weight 131 lb 9.8  oz (59.7 kg), SpO2 100.00%. General appearance: thin, chronically ill appearing. Asleep yet arousable. Drowsy when awake.  Wearing BIPAP Head: Normocephalic, without obvious abnormality, atraumatic Neck: bilateral JVD.  Resp: normal WOB on BiPAP 8/5 with  40 % FiO2.  Cardio: regular rate and rhythm, S1, S2 normal, no murmur, click, rub or gallop GI: soft, non-tender; bowel sounds normal; no masses,  no organomegaly Extremities: no LE edema even in dependent areas.  Pulses: 2+ and symmetric Skin: warm and dry. Multiple tattoos.  Neurologic: awake. Pupils constricted and sluggishly reactive.Moving all 4 extremities spontaneously. AV fistula with good bruit and thrill Skin with multiple tattoos    Labs: (all from outside hospital)  Complete Metabolic Panel: 139/5.9/96/21/46/4.88<174  Calcium 8.3, Albumin 3.8  Liver Function Tests: AST 58, ALT 46, Alk Phos 166 CBC:  23.3>10.6/32.9<248 11 monocytes, 1 + vacuoles  PT/INR: 1.1  Cardiac Enzymes: CPK 22, Trop-pending  ABG: 7.23/139/30m BiPAP 18/8 with 1005 FiO2 initiated  Repeat ABG --> 7.33/119/46   Xrays/Other Studies: 11/15/11 CXR: positive for mild cardiomegaly, pulmonary edema, pneumonia not excluded. 11/15/11 EKG: LVH, SVT and ? ST elevation V1 and V2 vs elevated baseline.  12/01/10 Myoview: negative  07/04/10 ECHO: EF 50-55 %  Assessment/Plan: 51 yo M with ESRD-DD presents with respiratory distress and evidence of pulmonary edema on CXR in the setting of active chest pain with incomplete dialysis on 1/12 (all details NA).   1. ESRD: Patient with evidence pulmonary edema on exam and CXR. Etiology of overload is likely incomplete dialysis dose +/- CHF from ACS. PCCM following closely. Plan for emergent inpatient HD with goal fluid removal of 3-4 L in hopes of avoiding intubation.   2. Hyperkalemia: K+ removal with HD. Used 2 mEq K+ bath. Will follow closely. Suspect slight false elevation due to hemolyzed specimen.   3. Respiratory  distress: Respiratory acidosis improved on BiPAP. Fluid removal with HD. PCCM managing.   4. Chest pain: PCCM managing. ? ACS. Trop pending. Asa, nitropaste, BP control with home dose metoprolol and Norvasc.    FUNCHES,JOSALYN 11/15/2011, 11:36 AM  I have seen and examined this patient and agree with plan as outlined by Dr. Armen Pickup.  Pt with H/O non-compliance, who abbreviated his dialysis treatment yesterday and is admitted with respiratory distress and pulmonary edema.  Cannot exclude underlying infection (elevated white count).  Plan emergent dialysis for volume removal with reassessment after, and followup CXR. Evaluating for ACS as well but suspect vol and uncontrolled htn biggest factors. Sean [name] B,MD 11/15/2011 12:45 PM

## 2011-11-15 NOTE — Progress Notes (Signed)
Pt currently receiving emergency HD for volsx/pulm edema.  No issues with AVF.  Qb 400 Qd 800.  Still on BIPAP - UF off so far 1160 with BP of 160/82 and pt sleeping.  Sean [name] B

## 2011-11-15 NOTE — H&P (Signed)
Name: Sean [name] MRN: [id number] DOB: 1961/01/26    LOS: 0  PCCM ADMISSION NOTE  History of Present Illness:  51 y/o WM, current smoker with PMH of ESRD on HD (Tu, Th, Sat-dry weight 57kgs), HTN, GERD, prior drug abuse, hep c, Depression presented to [facility] ED 1/13 with complaints of shortness of breath.  Indicates he did not complete full treatment HD on 1/12 and began having shortness of breath and sweating.  CXR in ER with bilateral airspace disease, cefalization.  Initial ABG demonstrates 7.23 / 50 / 139.  Placed on BiPAP for distress with f/u ABG of 7.33 / 46 / 119.  Tx to [facility] ICU for further care, HD.    Lines / Drains: Chronic LUE AV fistula (approx 50.51 year old)>>>  Cultures: 1/13 BCx2>>> 1/13 lactate>>> 1/13 PCT>>>  Antibiotics: 1/13 none  Tests / Events: 1/13- distress, BIPAP   Past Medical History  Diagnosis Date  . Paroxysmal ventricular tachycardia   . Headache   . Diarrhea   . Weight loss   . Unspecified sinusitis (chronic)   . Cigarette smoker   . Asthmatic bronchitis   . HTN (hypertension)   . Hypercholesterolemia   . Hypothyroidism   . GERD (gastroesophageal reflux disease)   . Diverticulosis   . Colonic polyp   . Hepatitis C   . History of drug abuse   . Renal failure   . Seizure disorder   . Anxiety   . Depression   . Anemia   . Disorders of porphyrin metabolism     Past Surgical History  Procedure Date  . Inguinal hernia repair 1973  . Orchiopexy 1973  . Nissen fundoplication 99  . Craniotomy 1978    skull fracture  . Av fistula repair 5-11    Dr. Arbie Cookey    Prior to Admission medications   Medication Sig Start Date End Date Taking? Authorizing Provider  b complex-vitamin c-folic acid (NEPHRO-VITE) 0.8 MG TABS Take 0.8 mg by mouth at bedtime.      Historical Provider, MD  Calcium Acetate 667 MG TABS Take 1 tablet by mouth 3 (three) times daily.      Historical Provider, MD  calcium carbonate (TUMS - DOSED IN MG ELEMENTAL CALCIUM)  500 MG chewable tablet Chew 1 tablet by mouth as needed.      Historical Provider, MD  carbamazepine (CARBATROL) 300 MG 12 hr capsule Take 300 mg by mouth 2 (two) times daily.      Historical Provider, MD  darbepoetin (ARANESP, ALBUMIN FREE,) 200 MCG/0.4ML SOLN Inject 200 mcg into the skin. Per Nephrology     Historical Provider, MD  diazepam (VALIUM) 10 MG tablet Take 1 tablet (10 mg total) by mouth 3 (three) times daily as needed. As needed for nerves 10/13/11   Michele Mcalpine, MD  dicyclomine (BENTYL) 20 MG tablet Take 20 mg by mouth 3 (three) times daily as needed.      Historical Provider, MD  escitalopram (LEXAPRO) 20 MG tablet Take 1 tablet (20 mg total) by mouth daily. 08/07/11   Michele Mcalpine, MD  HYDROcodone-acetaminophen (NORCO) 10-325 MG per tablet Take 1 tablet by mouth every 6 (six) hours as needed. DO NOT EXCEED  3 PER DAY 08/04/11   Michele Mcalpine, MD    Allergies Allergies  Allergen Reactions  . Iron     Causes pt to bruise easily    Family History Family History  Problem Relation Age of Onset  . COPD Mother   .  COPD Other     sibling    Social History  reports that he has been smoking Cigarettes.  He has been smoking about 2 packs per day. He has never used smokeless tobacco. He reports that he does not drink alcohol or use illicit drugs.  Review Of Systems:   Vital Signs: Temp:  [97.9 F (36.6 C)] 97.9 F (36.6 C) (01/13 0939) Pulse Rate:  [68] 68  (01/13 0939) Resp:  [21-25] 21  (01/13 0939) BP: (183)/(91) 183/91 mmHg (01/13 0939) SpO2:  [100 %] 100 % (01/13 0939) FiO2 (%):  [40 %] 40 % (01/13 0939) Weight:  [131 lb 9.8 oz (59.7 kg)] 131 lb 9.8 oz (59.7 kg) (01/13 0939)    Physical Examination: General: frail adult male, chronically ill appearing Neuro: AAOx2, MAE CV: S1S2 rrr, no m/r/g PULM: respirations even/non-labored on BiPAP.  Lungs bilaterally clear anterior, faint crackles posterior GI: flat,soft, bsx4 active Extremities: warm/dry, no edema.   Abrasion R forearm.     Labs    CBC No results found for this basename: HGB:3,HCT:3,WBC:3,PLT:3 in the last 168 hours     BMET No results found for this basename: NA:5,K:2,CL:5,CO2:5,GLUCOSE:5,BUN:5,CREATININE:5,CALCIUM:5,MG:5,PHOS:5 in the last 168 hours   Radiology: 1/13 CXR (from [facility])>>>images reviewed, bilateral airspace disease c/w edema / volume overload.  ?underlying infiltrate.  Assessment and Plan:  Acute respiratory failure.     Assessment: Current smoker.  CXR c/w volume overload.  Pt up from reported dry weight.  WBC's elevated ? Underlying infection. Plan: -Support with BiPap until volume removal as needed, no acute distress Ph corrected on current IPAP / EPAP, O2 needs improved -f/u cxr in am -trend WBC, low suspicion for active infection -BCx2, PCT, lactate to r/o infection -check BNP (reported 30k in [place]) Maintain BIPAP and have emergent HD  pcxr  Reviewed from [place] Control afterload, sys goals OK for now 150, consider 25% reduction from [place] admission BP May add hydralazine if NTG drip needed and not helpful  ESRD on HD Assessment: HD on 1/2 -pt reports he did not complete full treatment (?needed machine for another patient??).  Dry weight 57 kgs.  Current weight 59.7 kgs.  Missed full treatment on 1/12. Plan: -Nephrology consult for HD STAT -renal MVI PH reviewed k 5.9, if delay in HD, will kayxlate and bicarb drip   HTN Assessment: Not on home regimen ??.  Likely related to volume and BP at present.  Plan: -monitor -add Nitro paste, low threshold use NTG drip  Seizure Disorder Assessment: Plan: -continue home carbamazepine  Anxiety / Depression Assessment: Chronic Plan: -continue home lexapro  HEPATITIS C / Hx of Drug Abuse Assessment: 1/13 AST 58 / ALT 46 Plan: -monitor   Anemia of Chronic Disease Assessment: Chronic.   Plan: -monitor h/h, tx if less than 7% -no acute bleeding at this  time   GERD Assessment: Plan: -protonix QD  WBC No defined infiltrate, although difficult to see Wbc trend Follow fever Pct If spike, add CAP coverage    Best practices / Disposition: -->Code Status: full -->DVT Px: heparin -->GI Px: protonix -->Diet: NPO for now  Ccm 35 min   Mcarthur Rossetti. Tyson Alias, MD, FACP Pgr: [phone] [facility]    Canary Brim, NP-C [facility] Pgr: [phone]  11/15/2011, 9:47 AM

## 2011-11-15 NOTE — Progress Notes (Signed)
1612 per RN, call to dialysis revealed pt is off BIPAP and on 2L [place].

## 2011-11-16 ENCOUNTER — Inpatient Hospital Stay (HOSPITAL_COMMUNITY): Payer: Medicare Other

## 2011-11-16 ENCOUNTER — Inpatient Hospital Stay (HOSPITAL_COMMUNITY)
Admission: RE | Admit: 2011-11-16 | Discharge: 2011-11-16 | Disposition: A | Discharge status: Home / Self Care | Payer: Medicare Other | Source: Ambulatory Visit

## 2011-11-16 DIAGNOSIS — Z9119 Patient's noncompliance with other medical treatment and regimen: Secondary | ICD-10-CM

## 2011-11-16 LAB — BASIC METABOLIC PANEL
BUN: 23 mg/dL (ref 6–23)
CO2: 28 mEq/L (ref 19–32)
Chloride: 99 mEq/L (ref 96–112)
Creatinine, Ser: 2.96 mg/dL — ABNORMAL HIGH (ref 0.50–1.35)
Glucose, Bld: 114 mg/dL — ABNORMAL HIGH (ref 70–99)

## 2011-11-16 LAB — CBC
HCT: 28.2 % — ABNORMAL LOW (ref 39.0–52.0)
MCV: 97.9 fL (ref 78.0–100.0)
RBC: 2.88 MIL/uL — ABNORMAL LOW (ref 4.22–5.81)
WBC: 6 10*3/uL (ref 4.0–10.5)

## 2011-11-16 MED ORDER — HEPARIN SODIUM (PORCINE) 1000 UNIT/ML DIALYSIS
100.0000 [IU]/kg | INTRAMUSCULAR | Status: DC | PRN
  Administered 2011-11-16: 5600 [IU] via INTRAVENOUS_CENTRAL
  Filled 2011-11-16: qty 6

## 2011-11-16 MED ORDER — ALTEPLASE 2 MG IJ SOLR
2.0000 mg | Freq: Once | INTRAMUSCULAR | Status: DC | PRN
  Filled 2011-11-16: qty 2

## 2011-11-16 MED ORDER — PENTAFLUOROPROP-TETRAFLUOROETH EX AERO: 1.0000 "application " | INHALATION_SPRAY | CUTANEOUS | Status: DC | PRN

## 2011-11-16 MED ORDER — LIDOCAINE HCL (PF) 1 % IJ SOLN: 5.0000 mL | INTRAMUSCULAR | Status: DC | PRN

## 2011-11-16 MED ORDER — DIAZEPAM 5 MG PO TABS
5.0000 mg | ORAL_TABLET | Freq: Once | ORAL | Status: AC
  Administered 2011-11-16: 5 mg via ORAL

## 2011-11-16 MED ORDER — DARBEPOETIN ALFA-POLYSORBATE 200 MCG/0.4ML IJ SOLN
200.0000 ug | INTRAMUSCULAR | Status: DC
  Administered 2011-11-16: 200 ug via INTRAVENOUS
  Filled 2011-11-16: qty 0.4

## 2011-11-16 MED ORDER — SODIUM CHLORIDE 0.9 % IV SOLN: 100.0000 mL | INTRAVENOUS | Status: DC | PRN

## 2011-11-16 MED ORDER — NEPRO/CARBSTEADY PO LIQD
237.0000 mL | ORAL | Status: DC | PRN
  Filled 2011-11-16: qty 237

## 2011-11-16 MED ORDER — AMLODIPINE BESYLATE 5 MG PO TABS
5.0000 mg | ORAL_TABLET | Freq: Every day | ORAL | Status: DC
  Filled 2011-11-16: qty 1

## 2011-11-16 MED ORDER — DARBEPOETIN ALFA-POLYSORBATE 200 MCG/0.4ML IJ SOLN
INTRAMUSCULAR | Status: AC
  Administered 2011-11-16: 200 ug via INTRAVENOUS
  Filled 2011-11-16: qty 0.4

## 2011-11-16 MED ORDER — HEPARIN SODIUM (PORCINE) 1000 UNIT/ML DIALYSIS: 1000.0000 [IU] | INTRAMUSCULAR | Status: DC | PRN

## 2011-11-16 MED ORDER — LIDOCAINE-PRILOCAINE 2.5-2.5 % EX CREA
1.0000 "application " | TOPICAL_CREAM | CUTANEOUS | Status: DC | PRN
  Filled 2011-11-16: qty 5

## 2011-11-16 MED ORDER — DIAZEPAM 5 MG PO TABS
ORAL_TABLET | ORAL | Status: AC
  Filled 2011-11-16: qty 1

## 2011-11-16 NOTE — Progress Notes (Signed)
Patient ID: Sean [name], male   DOB: May 17, 1961, 51 y.o.   MRN: [id number] I was present at this session.  I have reviewed the session itself and made appropriate changes.  Lucille Crichlow L 1/14/20139:19 AM

## 2011-11-16 NOTE — Significant Event (Signed)
Pt to be discharged home. Discharge instructions discussed and reviewed with patient and pt daughter at bedside. Pt aware to follow up with MD Deterding in one day. Pt given discharge instructions. PIV removed. Pt will be wheelchair out by RN. Wyndi Northrup, Charity fundraiser.

## 2011-11-16 NOTE — Progress Notes (Signed)
Subjective: Interval History: none.  Objective: Vital signs in last 24 hours: Temp:  [97.9 F (36.6 C)-98.4 F (36.9 C)] 98.4 F (36.9 C) (01/14 0400) Pulse Rate:  [62-81] 62  (01/14 0700) Resp:  [13-25] 15  (01/14 0700) BP: (144-185)/(65-92) 155/73 mmHg (01/14 0700) SpO2:  [92 %-100 %] 99 % (01/14 0700) FiO2 (%):  [40 %-40.5 %] 40 % (01/13 1150) Weight:  [54.9 kg (121 lb 0.5 oz)-59.7 kg (131 lb 9.8 oz)] 55.6 kg (122 lb 9.2 oz) (01/14 0500) Weight change:   Intake/Output from previous day: 01/13 0701 - 01/14 0700 In: 764 [P.O.:590; I.V.:174] Out: 4200  Intake/Output this shift:    General appearance: alert and cooperative Resp: rales bilaterally, rhonchi bilaterally and wheezes bilaterally Cardio: S1, S2 normal and systolic murmur: systolic ejection 2/6, decrescendo at 2nd left intercostal space GI: live down 4 cm pos bs, soft Skin: sallow complexion  Lab Results:  Basename 11/16/11 0545 11/15/11 1026  WBC 6.0 9.9  HGB 9.0* 8.8*  HCT 28.2* 27.2*  PLT 152 151   BMET:  Basename 11/16/11 0545 11/15/11 1026  NA 137 --  K 4.9 --  CL 99 --  CO2 28 --  GLUCOSE 114* --  BUN 23 --  CREATININE 2.96* 4.88*  CALCIUM 8.6 --   No results found for this basename: PTH:2 in the last 72 hours Iron Studies: No results found for this basename: IRON,TIBC,TRANSFERRIN,FERRITIN in the last 72 hours  Studies/Results: No results found.  I have reviewed the patient's current medications.  Assessment/Plan: 1 ESRD still some vol on CXR, BP still ^ Do HD today 2 HTN vol a& meds 3 Anemia Start EPO 4 Nonadherence discussed 5 Hep C 6 Smoking discussed use patch 7 Resp failure resolved  P HD, EPO, adjust meds, patch, can D/C home after dialysis    LOS: 1 day   Byard Carranza L 11/16/2011,7:35 AM

## 2011-11-16 NOTE — Significant Event (Signed)
Pt to HD session today. RN and tech transported patient. Pt family aware via telephone call. VS stable. Will await for patient return to unit. Leafy Motsinger, Charity fundraiser

## 2011-11-16 NOTE — Significant Event (Signed)
Pt returned to room from HD at 1348pm. Pt stable. No complaints. VS stable. Pt denied pain. Will monitor. Aleria Maheu, Charity fundraiser.

## 2011-11-16 NOTE — Discharge Summary (Addendum)
Physician Discharge Summary  Patient ID: Sean [name] MRN: [id number] DOB/AGE: 1961-09-28 50 y.o.  Admit date: 11/15/2011 Discharge date: 11/16/2011  Admission Diagnoses: Acute respiratory failure, due to pulmonary edema ESRD on HD  Noncompliance with medical therapies HTN  HEPATITIS C / Hx of Drug Abuse  GERD  Anxiety D/O Seizure D/O Leukocytosis, NOS, resolved  Discharge Diagnoses:  Principal Problem:  *Patient non adherence Active Problems:  HEPATITIS C  Anemia of chronic kidney failure  ANXIETY  CIGARETTE SMOKER  HYPERTENSION  GERD  RENAL FAILURE, END STAGE  SEIZURE DISORDER  Acute respiratory failure   Discharged Condition: Improved  HPI: 51 y/o WM, current smoker with PMH of ESRD on HD (Tu, Th, Sat-dry weight 57kgs), HTN, GERD, prior drug abuse, hep c, Depression presented to [facility] ED 1/13 with complaints of shortness of breath. Indicates he did not complete full treatment HD on 1/12 and began having shortness of breath and sweating. CXR in ER with bilateral airspace disease, cephalization. Initial ABG demonstrates 7.23 / 50 / 139. Placed on BiPAP for distress with f/u ABG of 7.33 / 46 / 119. Tx to [facility] ICU for further care, HD.   Hospital Course:  Supported briefly with NPPV, then supplemental O2.  Underwent urgent HD on arrival and again on second day of hospitalization. Received NTG paste to assist with BP mgmt until HD could be performed  Continued on homedosages of carbamazepine and lexapro. Monitored in ICU through duration of his hospitalization. Deemed ready for D/C home after second HD treatment per Dr Deterding  Consults: renal  Significant Diagnostic Studies: None  Treatments: Hemodialysis  Discharge Exam: Blood pressure 163/71, pulse 62, temperature 99.7 F (37.6 C), temperature source Oral, resp. rate 20, height 5\' 11"  (1.803 m), weight 55.6 kg (122 lb 9.2 oz), SpO2 96.00%. Chronically ill appearing, thin to cachectic, NAD Chest clear  throughout RRR without M NABS No LE edema Neuro without focal deficits    Tri, [place]  Home Medication Instructions [id number]   Printed on:11/16/11 1331  Medication Information                    darbepoetin (ARANESP, ALBUMIN FREE,) 200 MCG/0.4ML SOLN Inject 200 mcg into the skin. Per Nephrology            Calcium Acetate 667 MG TABS Take 1 tablet by mouth 3 (three) times daily.             carbamazepine (CARBATROL) 300 MG 12 hr capsule Take 300 mg by mouth 2 (two) times daily.             dicyclomine (BENTYL) 20 MG tablet Take 20 mg by mouth 3 (three) times daily as needed.             b complex-vitamin c-folic acid (NEPHRO-VITE) 0.8 MG TABS Take 0.8 mg by mouth at bedtime.             calcium carbonate (TUMS - DOSED IN MG ELEMENTAL CALCIUM) 500 MG chewable tablet Chew 1 tablet by mouth as needed.             escitalopram (LEXAPRO) 20 MG tablet Take 1 tablet (20 mg total) by mouth daily.           diazepam (VALIUM) 10 MG tablet Take 1 tablet (10 mg total) by mouth 3 (three) times daily as needed. As needed for nerves           amLODipine (NORVASC) 5 MG tablet Take 5  mg by mouth daily.           metoprolol (LOPRESSOR) 50 MG tablet Take 50 mg by mouth 2 (two) times daily.           simvastatin (ZOCOR) 20 MG tablet Take 20 mg by mouth at bedtime.           hydrOXYzine (ATARAX/VISTARIL) 25 MG tablet Take 25 mg by mouth every 6 (six) hours as needed.             Disposition: Home-Health Care Svc  Discharge Orders    Future Appointments: Provider: Department: Dept Phone: Center:   12/09/2011 11:30 AM Michele Mcalpine, MD [facility] [phone] None      Follow-up Information    Follow up with DETERDING,JAMES L, MD in 1 day. (Keep your usual dialysis schedule)    Contact information:   [address] [employer] [place] [address] [phone]          Signed: Billy Fischer 11/16/2011, 12:53 PM

## 2011-12-09 ENCOUNTER — Ambulatory Visit: Payer: Medicare Other | Admitting: Pulmonary Disease

## 2011-12-24 ENCOUNTER — Encounter (HOSPITAL_COMMUNITY): Payer: Self-pay | Admitting: General Practice

## 2011-12-24 ENCOUNTER — Inpatient Hospital Stay (HOSPITAL_COMMUNITY)
Admission: AD | Admit: 2011-12-24 | Discharge: 2011-12-28 | DRG: 377 | Disposition: A | Discharge status: Home / Self Care | Payer: Medicare Other | Source: Other Acute Inpatient Hospital | Attending: Internal Medicine | Admitting: Internal Medicine

## 2011-12-24 DIAGNOSIS — K7460 Unspecified cirrhosis of liver: Secondary | ICD-10-CM | POA: Diagnosis present

## 2011-12-24 DIAGNOSIS — E7800 Pure hypercholesterolemia, unspecified: Secondary | ICD-10-CM | POA: Diagnosis present

## 2011-12-24 DIAGNOSIS — I10 Essential (primary) hypertension: Secondary | ICD-10-CM | POA: Diagnosis present

## 2011-12-24 DIAGNOSIS — F341 Dysthymic disorder: Secondary | ICD-10-CM | POA: Diagnosis present

## 2011-12-24 DIAGNOSIS — Z91158 Patient's noncompliance with renal dialysis for other reason: Secondary | ICD-10-CM

## 2011-12-24 DIAGNOSIS — R1032 Left lower quadrant pain: Secondary | ICD-10-CM | POA: Diagnosis present

## 2011-12-24 DIAGNOSIS — G40909 Epilepsy, unspecified, not intractable, without status epilepticus: Secondary | ICD-10-CM | POA: Diagnosis present

## 2011-12-24 DIAGNOSIS — B1710 Acute hepatitis C without hepatic coma: Secondary | ICD-10-CM | POA: Diagnosis present

## 2011-12-24 DIAGNOSIS — I120 Hypertensive chronic kidney disease with stage 5 chronic kidney disease or end stage renal disease: Secondary | ICD-10-CM | POA: Diagnosis present

## 2011-12-24 DIAGNOSIS — G8929 Other chronic pain: Secondary | ICD-10-CM | POA: Diagnosis present

## 2011-12-24 DIAGNOSIS — F17200 Nicotine dependence, unspecified, uncomplicated: Secondary | ICD-10-CM | POA: Diagnosis present

## 2011-12-24 DIAGNOSIS — N186 End stage renal disease: Secondary | ICD-10-CM | POA: Diagnosis present

## 2011-12-24 DIAGNOSIS — Z79899 Other long term (current) drug therapy: Secondary | ICD-10-CM

## 2011-12-24 DIAGNOSIS — K922 Gastrointestinal hemorrhage, unspecified: Secondary | ICD-10-CM | POA: Diagnosis present

## 2011-12-24 DIAGNOSIS — B1920 Unspecified viral hepatitis C without hepatic coma: Secondary | ICD-10-CM | POA: Diagnosis present

## 2011-12-24 DIAGNOSIS — R634 Abnormal weight loss: Secondary | ICD-10-CM | POA: Diagnosis present

## 2011-12-24 DIAGNOSIS — Z992 Dependence on renal dialysis: Secondary | ICD-10-CM

## 2011-12-24 DIAGNOSIS — K5900 Constipation, unspecified: Secondary | ICD-10-CM | POA: Diagnosis not present

## 2011-12-24 DIAGNOSIS — K219 Gastro-esophageal reflux disease without esophagitis: Secondary | ICD-10-CM | POA: Diagnosis present

## 2011-12-24 DIAGNOSIS — J189 Pneumonia, unspecified organism: Secondary | ICD-10-CM | POA: Diagnosis not present

## 2011-12-24 DIAGNOSIS — N189 Chronic kidney disease, unspecified: Secondary | ICD-10-CM

## 2011-12-24 DIAGNOSIS — Z9115 Patient's noncompliance with renal dialysis: Secondary | ICD-10-CM

## 2011-12-24 DIAGNOSIS — D62 Acute posthemorrhagic anemia: Secondary | ICD-10-CM | POA: Diagnosis present

## 2011-12-24 DIAGNOSIS — J45909 Unspecified asthma, uncomplicated: Secondary | ICD-10-CM | POA: Diagnosis present

## 2011-12-24 DIAGNOSIS — E785 Hyperlipidemia, unspecified: Secondary | ICD-10-CM | POA: Diagnosis present

## 2011-12-24 DIAGNOSIS — E039 Hypothyroidism, unspecified: Secondary | ICD-10-CM | POA: Diagnosis present

## 2011-12-24 DIAGNOSIS — R188 Other ascites: Secondary | ICD-10-CM | POA: Diagnosis present

## 2011-12-24 DIAGNOSIS — R569 Unspecified convulsions: Secondary | ICD-10-CM | POA: Diagnosis present

## 2011-12-24 DIAGNOSIS — N2581 Secondary hyperparathyroidism of renal origin: Secondary | ICD-10-CM | POA: Diagnosis present

## 2011-12-24 DIAGNOSIS — K920 Hematemesis: Principal | ICD-10-CM | POA: Diagnosis present

## 2011-12-24 HISTORY — DX: Unspecified convulsions: R56.9

## 2011-12-24 HISTORY — DX: Migraine, unspecified, not intractable, without status migrainosus: G43.909

## 2011-12-24 HISTORY — DX: Heart failure, unspecified: I50.9

## 2011-12-24 HISTORY — DX: Shortness of breath: R06.02

## 2011-12-24 HISTORY — DX: Dependence on renal dialysis: Z99.2

## 2011-12-24 HISTORY — DX: Encounter for other specified aftercare: Z51.89

## 2011-12-24 HISTORY — DX: Gastrointestinal hemorrhage, unspecified: K92.2

## 2011-12-24 HISTORY — DX: Personal history of other diseases of the digestive system: Z87.19

## 2011-12-24 HISTORY — DX: Disorder of kidney and ureter, unspecified: N28.9

## 2011-12-24 HISTORY — DX: Reserved for inherently not codable concepts without codable children: IMO0001

## 2011-12-24 HISTORY — DX: Cardiac murmur, unspecified: R01.1

## 2011-12-24 HISTORY — DX: Chronic obstructive pulmonary disease, unspecified: J44.9

## 2011-12-24 LAB — HEMOGLOBIN: Hemoglobin: 10.6 g/dL — ABNORMAL LOW (ref 13.0–17.0)

## 2011-12-24 LAB — HEMATOCRIT
HCT: 32.7 % — ABNORMAL LOW (ref 39.0–52.0)
HCT: 33.1 % — ABNORMAL LOW (ref 39.0–52.0)

## 2011-12-24 LAB — MRSA PCR SCREENING: MRSA by PCR: NEGATIVE

## 2011-12-24 MED ORDER — SIMVASTATIN 20 MG PO TABS
20.0000 mg | ORAL_TABLET | Freq: Every day | ORAL | Status: DC
  Administered 2011-12-24 – 2011-12-27 (×4): 20 mg via ORAL
  Filled 2011-12-24 (×6): qty 1

## 2011-12-24 MED ORDER — ACETAMINOPHEN 650 MG RE SUPP: 650.0000 mg | Freq: Four times a day (QID) | RECTAL | Status: DC | PRN

## 2011-12-24 MED ORDER — CALCIUM ACETATE 667 MG PO TABS: 1.0000 | ORAL_TABLET | Freq: Three times a day (TID) | ORAL | Status: DC

## 2011-12-24 MED ORDER — METOPROLOL TARTRATE 50 MG PO TABS
50.0000 mg | ORAL_TABLET | Freq: Two times a day (BID) | ORAL | Status: DC
  Administered 2011-12-24 – 2011-12-28 (×7): 50 mg via ORAL
  Filled 2011-12-24 (×10): qty 1

## 2011-12-24 MED ORDER — HYDROMORPHONE HCL PF 1 MG/ML IJ SOLN
1.0000 mg | INTRAMUSCULAR | Status: DC | PRN
  Administered 2011-12-24: 1 mg via INTRAVENOUS
  Filled 2011-12-24: qty 1

## 2011-12-24 MED ORDER — CALCIUM ACETATE 667 MG PO CAPS
667.0000 mg | ORAL_CAPSULE | Freq: Three times a day (TID) | ORAL | Status: DC
Start: 2011-12-24 — End: 2011-12-28
  Administered 2011-12-25 – 2011-12-28 (×10): 667 mg via ORAL
  Filled 2011-12-24 (×15): qty 1

## 2011-12-24 MED ORDER — CARBAMAZEPINE ER 200 MG PO TB12
300.0000 mg | ORAL_TABLET | Freq: Two times a day (BID) | ORAL | Status: DC
  Administered 2011-12-24 – 2011-12-28 (×8): 300 mg via ORAL
  Filled 2011-12-24 (×10): qty 1

## 2011-12-24 MED ORDER — CALCIUM CARBONATE ANTACID 500 MG PO CHEW
2.0000 | CHEWABLE_TABLET | Freq: Two times a day (BID) | ORAL | Status: DC
  Administered 2011-12-24 – 2011-12-28 (×7): 400 mg via ORAL
  Filled 2011-12-24 (×10): qty 2

## 2011-12-24 MED ORDER — ESCITALOPRAM OXALATE 20 MG PO TABS
20.0000 mg | ORAL_TABLET | Freq: Every day | ORAL | Status: DC
  Administered 2011-12-24 – 2011-12-28 (×5): 20 mg via ORAL
  Filled 2011-12-24 (×6): qty 1

## 2011-12-24 MED ORDER — NICOTINE 21 MG/24HR TD PT24
21.0000 mg | MEDICATED_PATCH | Freq: Every day | TRANSDERMAL | Status: DC
  Administered 2011-12-24 – 2011-12-28 (×5): 21 mg via TRANSDERMAL
  Filled 2011-12-24 (×5): qty 1

## 2011-12-24 MED ORDER — HYDROCODONE-ACETAMINOPHEN 5-325 MG PO TABS
1.0000 | ORAL_TABLET | ORAL | Status: DC | PRN
  Administered 2011-12-24: 1 via ORAL
  Administered 2011-12-24 – 2011-12-25 (×3): 2 via ORAL
  Filled 2011-12-24: qty 1
  Filled 2011-12-24 (×3): qty 2

## 2011-12-24 MED ORDER — DIAZEPAM 5 MG PO TABS
10.0000 mg | ORAL_TABLET | Freq: Three times a day (TID) | ORAL | Status: DC | PRN
  Administered 2011-12-26: 10 mg via ORAL
  Filled 2011-12-24: qty 2

## 2011-12-24 MED ORDER — ONDANSETRON HCL 4 MG/2ML IJ SOLN: 4.0000 mg | Freq: Four times a day (QID) | INTRAMUSCULAR | Status: DC | PRN

## 2011-12-24 MED ORDER — ACETAMINOPHEN 325 MG PO TABS: 650.0000 mg | ORAL_TABLET | Freq: Four times a day (QID) | ORAL | Status: DC | PRN

## 2011-12-24 MED ORDER — ONDANSETRON HCL 4 MG PO TABS: 4.0000 mg | ORAL_TABLET | Freq: Four times a day (QID) | ORAL | Status: DC | PRN

## 2011-12-24 MED ORDER — ALBUTEROL SULFATE (5 MG/ML) 0.5% IN NEBU: 2.5000 mg | INHALATION_SOLUTION | RESPIRATORY_TRACT | Status: DC | PRN

## 2011-12-24 MED ORDER — CARBAMAZEPINE ER 300 MG PO CP12: 300.0000 mg | ORAL_CAPSULE | Freq: Two times a day (BID) | ORAL | Status: DC

## 2011-12-24 MED ORDER — PANTOPRAZOLE SODIUM 40 MG IV SOLR
40.0000 mg | Freq: Two times a day (BID) | INTRAVENOUS | Status: DC
  Administered 2011-12-24: 40 mg via INTRAVENOUS
  Filled 2011-12-24 (×3): qty 40

## 2011-12-24 MED ORDER — HYDROXYZINE HCL 25 MG PO TABS: 25.0000 mg | ORAL_TABLET | Freq: Four times a day (QID) | ORAL | Status: DC | PRN

## 2011-12-24 MED ORDER — SODIUM CHLORIDE 0.9 % IJ SOLN
3.0000 mL | Freq: Two times a day (BID) | INTRAMUSCULAR | Status: DC
  Administered 2011-12-24 – 2011-12-27 (×7): 3 mL via INTRAVENOUS
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
## 2011-12-24 NOTE — H&P (Signed)
PCP:   Sean Mcalpine, MD, MD   Chief Complaint:  Rectal bleeding, abdominal pain and generalized weakness.  HPI: 51 year old Caucasian male patient who has extensive past medical history including end-stage renal disease on hemodialysis Tuesdays, Thursdays and Saturdays, hepatitis C, hypertension, hyperlipidemia, chronic pain, hypothyroid, diverticulosis, gastroesophageal reflux disease, seizure disorder, headaches, anxiety and depression, porphyria cutanea tarda, ongoing tobacco abuse who was transferred from [facility] secondary to above complaints. Patient complains of approximately one week of generalized weakness and occasional dizziness. He also complains of intermittent throbbing lower abdominal pain which at times gets severe at 10/10 with radiation to lower back. No relieving factors. Made worse on straining. Appetite has been poor since he started dialysis approximately year and a half ago. No weight loss. One episode of large amount of dark blood with clots last night. One episode of nausea and vomiting which did not contain coffee grounds or blood. He denies abusing of using NSAIDs. He had 2 hours of dialysis today. At dialysis his hemoglobin was noted to be 8.8 with baseline probably in the 9-10 g/dL range. Due to his history of rectal bleeding and drop in his hemoglobin, he was transferred to [facility] and eventually transferred to [facility]. Currently he is almost through with his second unit of packed red blood cell transfusion and indicates that he feels stronger with no further dizziness.  Per review of records colonoscopy in April 2011 showed mild sigmoid diverticulosis, internal hemorrhoids and patient had polypectomy. EGD in April 2011 showed enlarged gastric folds otherwise normal.  Past Medical History: Past Medical History  Diagnosis Date  . Paroxysmal ventricular tachycardia   . Headache   . Diarrhea   . Weight loss   . Unspecified sinusitis  (chronic)   . Cigarette smoker   . Asthmatic bronchitis   . HTN (hypertension)   . Hypercholesterolemia   . Hypothyroidism   . GERD (gastroesophageal reflux disease)   . Diverticulosis   . Colonic polyp   . Hepatitis C   . History of drug abuse   . Renal failure   . Seizure disorder   . Anxiety   . Depression   . Anemia   . Disorders of porphyrin metabolism     Past Surgical History: Past Surgical History  Procedure Date  . Inguinal hernia repair 1973  . Orchiopexy 1973  . Nissen fundoplication 99  . Craniotomy 1978    skull fracture  . Av fistula repair 5-11    Dr. Arbie Cookey    Allergies:   Allergies  Allergen Reactions  . Iron     Causes pt to bruise easily    Medications: Prior to Admission medications   Medication Sig Start Date End Date Taking? Authorizing Provider  amLODipine (NORVASC) 5 MG tablet Take 5 mg by mouth daily.   Yes Historical Provider, MD  Calcium Acetate 667 MG TABS Take 1 tablet by mouth 3 (three) times daily.     Yes Historical Provider, MD  calcium carbonate (TUMS - DOSED IN MG ELEMENTAL CALCIUM) 500 MG chewable tablet Chew 2 tablets by mouth 2 (two) times daily.    Yes Historical Provider, MD  carbamazepine (CARBATROL) 300 MG 12 hr capsule Take 300 mg by mouth 2 (two) times daily.     Yes Historical Provider, MD  diazepam (VALIUM) 10 MG tablet Take 1 tablet (10 mg total) by mouth 3 (three) times daily as needed. As needed for nerves 10/13/11  Yes Sean Mcalpine, MD  escitalopram [facility])  20 MG tablet Take 1 tablet (20 mg total) by mouth daily. 08/07/11  Yes Sean Mcalpine, MD  metoprolol (LOPRESSOR) 50 MG tablet Take 50 mg by mouth 2 (two) times daily.   Yes Historical Provider, MD  simvastatin (ZOCOR) 20 MG tablet Take 20 mg by mouth at bedtime.   Yes Historical Provider, MD  hydrOXYzine (ATARAX/VISTARIL) 25 MG tablet Take 25 mg by mouth every 6 (six) hours as needed. For itching    Historical Provider, MD    Family History: Family History    Problem Relation Age of Onset  . COPD Mother   . COPD Other     sibling    Social History:  reports that he has been smoking Cigarettes.  He has been smoking about 2 packs per day. He has never used smokeless tobacco. He reports that he does not drink alcohol or use illicit drugs. Patient indicates that he is currently smoking one pack a of cigarettes per day. He denies any alcohol use. He indicates remote use of recreational drugs but none recently.   Review of Systems:  All systems reviewed and apart from history of presenting illness is pertinent for intermittent itching of his body. He has scars or scabs off his porphria. He denies any recent seizures. He also says that he has not been to a plain clinic in more than a year and a half.  Physical Exam: Filed Vitals:   12/24/11 1530 12/24/11 1635  BP: 152/68 145/69  Pulse: 59 58  Temp: 98 F (36.7 C) 97.8 F (36.6 C)  TempSrc: Oral Oral  Resp: 18 18   General appearance: Moderately built and nourished patient who is lying comfortably in the gurney and is in no obvious distress.  Head: Nontraumatic and normocephalic.  Eyes: Pupils equally reacting to light and accommodation.  Ears: Normal  Nose: No acute findings. No sinus tenderness.  Throat: Mucosa is moist. No oral thrush.  Neck: Supple. No JVD or carotid bruit. Lymph nodes: No lymphadenopathy.  Resp: Clear to auscultation. No increased work of breathing.  Cardio: First and second heart sounds heard, regular rate and rythm. No murmurs or JVD or gallop or pedal edema.   GI: Non distended. Mild tenderness in the suprapubic and left lower quadrant and right upper quadrant but abdomen is soft with no rigidity guarding or rebound. No organomegaly or masses appreciated. Normal bowel sounds heard.  Extremities: symmetric 5/5 power. Skin: Multiple tattoos and scars or scabs. No acute lesions.  Neurologic: Alert and oriented. No focal neurological deficits.   Labs on Admission:  These were done at [facility].  1. CBCs: Hemoglobin 8.8, hematocrit 27, white blood cells 6.1, platelets 210 and MCV 89. 2. INR 1.2 3. Comprehensive metabolic panel: Sodium 140, protection 3.6, chloride 97, bicarbonate 32, glucose 88, BUN 25, creatinine 3.6, calcium 8.2, albumin 3.2, total protein 6.2, total bilirubin 0.8, AST 23, ALT 26, alkaline phosphatase 137. 4. Cardiac enzymes were negative x1. 5. Pro BNP greater than 175000 6. EKG normal sinus rhythm at 70 beats per minute, rightward axis, left ventricular hypertrophy with repolarization abnormalities, QTC 503 ms  7. Hemoccult positive 8. Chest x-ray: Pulmonary vascular congestion superimposed upon chronic changes. Cardiomegaly. Calcified mildly tortuous aorta.     Assessment/Plan Present on Admission:  .Acute GI bleeding .Anemia, posthemorrhagic, acute .CIGARETTE SMOKER .GERD .HYPERTENSION .HYPOTHYROIDISM, BORDERLINE .SEIZURE DISORDER .RENAL FAILURE, END STAGE  1. Acute GI bleeding: Possibly lower gastrointestinal bleeding which could be diverticular in etiology versus ischemic colitis. Will  admit to telemetry. Place on clear liquids and IV protonix every 12 hours. [place] [place] [facility] has been consulted. 2. Acute post hemorrhagic anemia complicating chronic anemia secondary to end-stage renal disease: There seemed to be a mild drop in hemoglobin from 9-10 g/dL to 8.8 g/dL. We'll follow posttransfusion hemoglobin and transfuse as needed. Follow H&H Q6 hourly. 3. End-stage renal disease: Nephrology has been consulted and will see patient tomorrow. 4. Tobacco abuse: Cessation counseled and will initiate nicotine patch to which patient is agreeable. 5. Hypertension: Fair control. Continue metoprolol but will hold amlodipine to avoid precipitous drop if he has further GI bleeding. 6. Gastroesophageal reflux disease: Continue IV PPIs. 7. Seizure disorder: No recent seizures. Seizure precautions and continue  Tegretol. 8. Full code. This was confirmed with the patient.    Sean [name] 12/24/2011, 4:54 PM

## 2011-12-24 NOTE — Progress Notes (Signed)
Blood transfusion complete. Vitals remain stable. Pt's IV saline locked, until further orders from the doctor.

## 2011-12-24 NOTE — Progress Notes (Signed)
Pt arrived to unit from [facility] via stretcher. Pt complaining of 6/10 throbbing pain to lower abdomen and lower back. Pt is currently receiving his 2nd unit of blood into his right AC at 189mL/hr. Pt is alert and oriented, and is resting in the bed. There are currently no orders for pt care. Vitals are stable.

## 2011-12-25 ENCOUNTER — Encounter (HOSPITAL_COMMUNITY): Payer: Self-pay | Admitting: Radiology

## 2011-12-25 ENCOUNTER — Inpatient Hospital Stay (HOSPITAL_COMMUNITY): Payer: Medicare Other

## 2011-12-25 DIAGNOSIS — R1032 Left lower quadrant pain: Secondary | ICD-10-CM

## 2011-12-25 DIAGNOSIS — K922 Gastrointestinal hemorrhage, unspecified: Secondary | ICD-10-CM

## 2011-12-25 LAB — CBC
HCT: 36.4 % — ABNORMAL LOW (ref 39.0–52.0)
Hemoglobin: 11.7 g/dL — ABNORMAL LOW (ref 13.0–17.0)
MCV: 88.3 fL (ref 78.0–100.0)
RBC: 4.12 MIL/uL — ABNORMAL LOW (ref 4.22–5.81)
WBC: 7.3 10*3/uL (ref 4.0–10.5)

## 2011-12-25 LAB — COMPREHENSIVE METABOLIC PANEL
Albumin: 2.8 g/dL — ABNORMAL LOW (ref 3.5–5.2)
BUN: 34 mg/dL — ABNORMAL HIGH (ref 6–23)
Calcium: 8.9 mg/dL (ref 8.4–10.5)
GFR calc Af Amer: 15 mL/min — ABNORMAL LOW (ref >90)
Glucose, Bld: 96 mg/dL (ref 70–99)
Total Protein: 6.4 g/dL (ref 6.0–8.3)

## 2011-12-25 LAB — GLUCOSE, CAPILLARY
Glucose-Capillary: 115 mg/dL — ABNORMAL HIGH (ref 70–99)
Glucose-Capillary: 104 mg/dL — ABNORMAL HIGH (ref 70–99)

## 2011-12-25 LAB — HEMATOCRIT: HCT: 34.8 % — ABNORMAL LOW (ref 39.0–52.0)

## 2011-12-25 LAB — HEMOGLOBIN: Hemoglobin: 11.1 g/dL — ABNORMAL LOW (ref 13.0–17.0)

## 2011-12-25 MED ORDER — SODIUM CHLORIDE 0.9 % IV SOLN: 100.0000 mL | INTRAVENOUS | Status: DC | PRN

## 2011-12-25 MED ORDER — LIDOCAINE HCL (PF) 1 % IJ SOLN: 5.0000 mL | INTRAMUSCULAR | Status: DC | PRN

## 2011-12-25 MED ORDER — SORBITOL 70 % SOLN: 30.0000 mL | Status: DC | PRN

## 2011-12-25 MED ORDER — HYDROCORTISONE ACETATE 25 MG RE SUPP
25.0000 mg | Freq: Two times a day (BID) | RECTAL | Status: DC
  Administered 2011-12-25 – 2011-12-27 (×6): 25 mg via RECTAL
  Filled 2011-12-25 (×10): qty 1

## 2011-12-25 MED ORDER — ACETAMINOPHEN 325 MG PO TABS: 650.0000 mg | ORAL_TABLET | Freq: Four times a day (QID) | ORAL | Status: DC | PRN

## 2011-12-25 MED ORDER — BOOST / RESOURCE BREEZE PO LIQD
1.0000 | Freq: Three times a day (TID) | ORAL | Status: DC
  Administered 2011-12-28: 1 via ORAL

## 2011-12-25 MED ORDER — ZOLPIDEM TARTRATE 5 MG PO TABS
5.0000 mg | ORAL_TABLET | Freq: Every evening | ORAL | Status: DC | PRN
  Administered 2011-12-27: 5 mg via ORAL
  Filled 2011-12-25: qty 1

## 2011-12-25 MED ORDER — PANTOPRAZOLE SODIUM 40 MG PO TBEC
40.0000 mg | DELAYED_RELEASE_TABLET | Freq: Every day | ORAL | Status: DC
  Administered 2011-12-25 – 2011-12-28 (×3): 40 mg via ORAL
  Filled 2011-12-25 (×4): qty 1

## 2011-12-25 MED ORDER — IOHEXOL 300 MG/ML  SOLN
20.0000 mL | INTRAMUSCULAR | Status: AC
  Administered 2011-12-25 (×2): 20 mL via ORAL

## 2011-12-25 MED ORDER — ACETAMINOPHEN 650 MG RE SUPP: 650.0000 mg | Freq: Four times a day (QID) | RECTAL | Status: DC | PRN

## 2011-12-25 MED ORDER — CAMPHOR-MENTHOL 0.5-0.5 % EX LOTN
1.0000 "application " | TOPICAL_LOTION | Freq: Three times a day (TID) | CUTANEOUS | Status: DC | PRN
  Filled 2011-12-25: qty 222

## 2011-12-25 MED ORDER — DARBEPOETIN ALFA-POLYSORBATE 100 MCG/0.5ML IJ SOLN
100.0000 ug | INTRAMUSCULAR | Status: DC
  Administered 2011-12-26: 100 ug via INTRAVENOUS
  Filled 2011-12-25: qty 0.5

## 2011-12-25 MED ORDER — ONDANSETRON HCL 4 MG/2ML IJ SOLN: 4.0000 mg | Freq: Four times a day (QID) | INTRAMUSCULAR | Status: DC | PRN

## 2011-12-25 MED ORDER — CALCIUM CARBONATE 1250 MG/5ML PO SUSP: 500.0000 mg | Freq: Four times a day (QID) | ORAL | Status: DC | PRN

## 2011-12-25 MED ORDER — HYDROXYZINE HCL 25 MG PO TABS: 25.0000 mg | ORAL_TABLET | Freq: Three times a day (TID) | ORAL | Status: DC | PRN

## 2011-12-25 MED ORDER — IOHEXOL 300 MG/ML  SOLN
100.0000 mL | Freq: Once | INTRAMUSCULAR | Status: AC | PRN
  Administered 2011-12-25: 100 mL via INTRAVENOUS

## 2011-12-25 MED ORDER — ONDANSETRON HCL 4 MG PO TABS: 4.0000 mg | ORAL_TABLET | Freq: Four times a day (QID) | ORAL | Status: DC | PRN

## 2011-12-25 MED ORDER — DOCUSATE SODIUM 283 MG RE ENEM: 1.0000 | ENEMA | RECTAL | Status: DC | PRN

## 2011-12-25 MED ORDER — PENTAFLUOROPROP-TETRAFLUOROETH EX AERO: 1.0000 "application " | INHALATION_SPRAY | CUTANEOUS | Status: DC | PRN

## 2011-12-25 MED ORDER — PARICALCITOL 5 MCG/ML IV SOLN
1.0000 ug | INTRAVENOUS | Status: DC
  Filled 2011-12-25: qty 0.2

## 2011-12-25 MED ORDER — ALTEPLASE 2 MG IJ SOLR
2.0000 mg | Freq: Once | INTRAMUSCULAR | Status: AC | PRN
  Filled 2011-12-25: qty 2

## 2011-12-25 MED ORDER — LIDOCAINE-PRILOCAINE 2.5-2.5 % EX CREA: 1.0000 "application " | TOPICAL_CREAM | CUTANEOUS | Status: DC | PRN

## 2011-12-25 MED ORDER — HYDROMORPHONE HCL PF 1 MG/ML IJ SOLN
1.0000 mg | INTRAMUSCULAR | Status: DC | PRN
  Administered 2011-12-25 – 2011-12-28 (×14): 1 mg via INTRAVENOUS
  Filled 2011-12-25 (×14): qty 1

## 2011-12-25 MED ORDER — HEPARIN SODIUM (PORCINE) 1000 UNIT/ML DIALYSIS
1000.0000 [IU] | INTRAMUSCULAR | Status: DC | PRN
  Filled 2011-12-25: qty 1
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
## 2011-12-25 NOTE — Progress Notes (Signed)
Utilization Review Completed.Merril Isakson T2/22/2013   

## 2011-12-25 NOTE — Progress Notes (Signed)
Patient off unit

## 2011-12-25 NOTE — Progress Notes (Signed)
Patient ID: Sean [name], male   DOB: 17-Nov-1960, 51 y.o.   MRN: [id number]  Assessment/Plan:  Principal Problem:   *ACUTE GI BLEEDING - Possibly lower gastrointestinal bleeding which could be diverticular in etiology versus ischemic colitis.  - Place on clear liquids and IV protonix every 12 hours.  - GI is following  Active Problems:  Acute post hemorrhagic anemia complicating chronic anemia secondary to end-stage renal disease:  - Hemoglobin stable - patient reported no longer bleeding episodes - transfuse as needed. Follow H&H Q12 hours.   END STAGE RENAL DISEASE - Nephrology following  HYPERTENSION - Continue metoprolol but will hold amlodipine to avoid precipitous drop if he has further GI bleeding.   GERD - Continue IV PPIs.   SEIZURE DISORDER - No recent seizures.  - Seizure precautions  - continue Tegretol.   EDUCATION - test results and diagnostic studies were discussed with patient  at the bedside - questions were answered at the bedside and contact information was provided for additional questions or concerns   Subjective: No events overnight. Patient denies chest pain, shortness of breath, abdominal pain.   Objective:  Vital signs in last 24 hours:  Filed Vitals:   12/25/11 1300 12/25/11 1753 12/25/11 2058  BP: 169/76 172/83 172/77  Pulse: 60 64 67  Temp: 97.8 F (36.6 C) 97.5 F (36.4 C) 98.1 F (36.7 C)  TempSrc: Oral Oral Oral  Resp: 18 18 18   Height:     Weight:     SpO2: 97% 96% 95%    Intake/Output from previous day:   Gross per 24 hour  Intake    600 ml  Output      0 ml  Net    600 ml    Physical Exam: General: Alert, awake, oriented x3, in no acute distress. HEENT: No bruits, no goiter. Moist mucous membranes, no scleral icterus, no conjunctival pallor. Heart: Regular rate and rhythm, S1/S2 +, no murmurs, rubs, gallops. Lungs: Clear to auscultation bilaterally. No wheezing, no rhonchi, no rales.  Abdomen: Soft, nontender,  nondistended, positive bowel sounds. Extremities: No clubbing or cyanosis, no pitting edema,  positive pedal pulses. Neuro: Grossly nonfocal.  Lab Results:  Lab 12/25/11 1038 12/25/11 0445 12/24/11 2336 12/24/11 1736  WBC 7.3 -- -- --  HGB 11.7* 11.1* 10.6* 10.7*  HCT 36.4* 34.8* 32.7* 33.1*  PLT 196 -- -- --  MCV 88.3 -- -- --    Lab 12/25/11 1038  NA 135  K 4.5  CL 93*  CO2 28  GLUCOSE 96  BUN 34*  CREATININE 4.73*  CALCIUM 8.9    Studies/Results: Ct Abdomen Pelvis W Contrast 12/25/2011 IMPRESSION:  1.  Hepatic cirrhosis and hepatic steatosis.  Possible gastric varices.  Moderate ascites. 2.  Distended gallbladder without CT evidence of cholelithiasis or acute cholecystitis.  No biliary ductal dilation. 3.  Atrophy involving the body of the pancreas.  Pancreas otherwise normal. 4.  Atrophic kidneys bilaterally consistent with the history of end- stage renal disease. 5.  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 6.  Bilateral pleural effusions, right greater than left, and pneumonia in the visualized lower lobes. 7.  Scattered mildly enlarged retroperitoneal lymph nodes, statistically reactive nodes. 8.  Large stool burden.    Medications: Scheduled Meds:   . calcium acetate  667 mg Oral TID WC  . calcium carbonate  2 tablet Oral BID  . carbamazepine  300 mg Oral BID  . darbepoetin (ARANESP) injection - DIALYSIS  100 mcg Intravenous Q Sat-HD  . escitalopram  20 mg Oral Daily  . hydrocortisone  25 mg Rectal BID  . iohexol  20 mL Oral Q1 Hr x 2  . metoprolol  50 mg Oral BID  . nicotine  21 mg Transdermal Daily  . pantoprazole  40 mg Oral Q0600  . paricalcitol  1 mcg Intravenous 3 times weekly  . simvastatin  20 mg Oral QHS    LOS: 1 day   Kearstyn Avitia 12/25/2011, 11:09 PM  [facility] Pager: [phone]

## 2011-12-25 NOTE — Consult Note (Signed)
Chart was reviewed and patient was examined. X-rays were reviewed.   Patient has had chronic bleeding. Etiologies could be do to hemorrhoids, polyps or AVMs. Most recently increased bleeding and worsening anemia  could be do to other concerns as well. Abdominal pain and back pain raises the question of an intra-abdominal process such as ischemic colitis. This may also cause rectal bleeding.  Recommendations #1 CT of the abdomen and pelvis #2 colonoscopy pending results of above   Barbette Hair. Arlyce Dice, M.D., [facility]

## 2011-12-26 ENCOUNTER — Inpatient Hospital Stay (HOSPITAL_COMMUNITY): Payer: Medicare Other

## 2011-12-26 DIAGNOSIS — J189 Pneumonia, unspecified organism: Secondary | ICD-10-CM | POA: Diagnosis present

## 2011-12-26 DIAGNOSIS — K5900 Constipation, unspecified: Secondary | ICD-10-CM | POA: Diagnosis present

## 2011-12-26 LAB — RENAL FUNCTION PANEL
Albumin: 2.8 g/dL — ABNORMAL LOW (ref 3.5–5.2)
BUN: 44 mg/dL — ABNORMAL HIGH (ref 6–23)
CO2: 25 mEq/L (ref 19–32)
Calcium: 8.7 mg/dL (ref 8.4–10.5)
Chloride: 89 mEq/L — ABNORMAL LOW (ref 96–112)
Creatinine, Ser: 6.01 mg/dL — ABNORMAL HIGH (ref 0.50–1.35)
GFR calc Af Amer: 11 mL/min — ABNORMAL LOW (ref >90)
GFR calc non Af Amer: 10 mL/min — ABNORMAL LOW (ref >90)
Glucose, Bld: 94 mg/dL (ref 70–99)
Phosphorus: 4.6 mg/dL (ref 2.3–4.6)
Potassium: 4.4 mEq/L (ref 3.5–5.1)
Sodium: 129 mEq/L — ABNORMAL LOW (ref 135–145)

## 2011-12-26 LAB — CBC
HCT: 34.2 % — ABNORMAL LOW (ref 39.0–52.0)
Hemoglobin: 11 g/dL — ABNORMAL LOW (ref 13.0–17.0)
MCH: 27.7 pg (ref 26.0–34.0)
MCHC: 32.2 g/dL (ref 30.0–36.0)
MCV: 86.1 fL (ref 78.0–100.0)
Platelets: 190 10*3/uL (ref 150–400)
RBC: 3.97 MIL/uL — ABNORMAL LOW (ref 4.22–5.81)
RDW: 15.2 % (ref 11.5–15.5)
WBC: 8.9 10*3/uL (ref 4.0–10.5)

## 2011-12-26 MED ORDER — DARBEPOETIN ALFA-POLYSORBATE 100 MCG/0.5ML IJ SOLN
INTRAMUSCULAR | Status: AC
  Filled 2011-12-26: qty 0.5

## 2011-12-26 MED ORDER — MOXIFLOXACIN HCL IN NACL 400 MG/250ML IV SOLN
400.0000 mg | INTRAVENOUS | Status: DC
  Administered 2011-12-26 – 2011-12-27 (×2): 400 mg via INTRAVENOUS
  Filled 2011-12-26 (×4): qty 250

## 2011-12-26 MED ORDER — HYDROMORPHONE HCL PF 1 MG/ML IJ SOLN
INTRAMUSCULAR | Status: AC
  Administered 2011-12-26: 1 mg via INTRAVENOUS
  Filled 2011-12-26: qty 1

## 2011-12-26 NOTE — Progress Notes (Signed)
Patient ID: Sean [name], male   DOB: 08-Feb-1961, 51 y.o.   MRN: [id number] Subjective: No hematochezia with his BM last evening.  Objective: Vital signs in last 24 hours: Temp:  [97.5 F (36.4 C)-98.2 F (36.8 C)] 98.1 F (36.7 C) (02/23 0454) Pulse Rate:  [60-70] 70  (02/23 0454) Resp:  [18-20] 18  (02/23 0454) BP: (145-172)/(69-86) 170/86 mmHg (02/23 0454) SpO2:  [95 %-98 %] 96 % (02/23 0454) Weight:  [59.3 kg (130 lb 11.7 oz)] 59.3 kg (130 lb 11.7 oz) (02/22 1921) Last BM Date: 12/26/11  Intake/Output from previous day: 02/22 0701 - 02/23 0700 In: 600 [P.O.:600] Out: -  Intake/Output this shift:    General appearance: alert and no distress GI: soft, non-tender; bowel sounds normal; no masses,  no organomegaly  Lab Results:  Basename 12/25/11 1038 12/25/11 0445 12/24/11 2336  WBC 7.3 -- --  HGB 11.7* 11.1* 10.6*  HCT 36.4* 34.8* 32.7*  PLT 196 -- --   BMET  Basename 12/25/11 1038  NA 135  K 4.5  CL 93*  CO2 28  GLUCOSE 96  BUN 34*  CREATININE 4.73*  CALCIUM 8.9   LFT  Basename 12/25/11 1038  PROT 6.4  ALBUMIN 2.8*  AST 14  ALT 7  ALKPHOS 138*  BILITOT 0.6  BILIDIR --  IBILI --   PT/INR No results found for this basename: LABPROT:2,INR:2 in the last 72 hours Hepatitis Panel No results found for this basename: HEPBSAG,HCVAB,HEPAIGM,HEPBIGM in the last 72 hours C-Diff No results found for this basename: CDIFFTOX:3 in the last 72 hours Fecal Lactopherrin No results found for this basename: FECLLACTOFRN in the last 72 hours  Studies/Results: Ct Abdomen Pelvis W Contrast  12/25/2011  *RADIOLOGY REPORT*  Clinical Data: Rectal bleeding with drop in hemoglobin.  Lower abdominal pain radiating to the low back.  History of hepatitis C and end-stage renal disease.  Surgical history includes inguinal hernia repair, Nissen fundoplication, and orchiopexy.  CT ABDOMEN AND PELVIS WITH CONTRAST 12/25/2011:  Technique:  Multidetector CT imaging of the abdomen and  pelvis was performed following the standard protocol during bolus administration of intravenous contrast.  Contrast: OMNIPAQUE IOHEXOL 300 MG/ML IV. Oral contrast was also administered.  Comparison: No prior CT.  Abdominal ultrasound 08/02/2008 [facility], 05/23/2007 [facility].  Findings: Spleen upper normal in size measuring approximate 11.7 x 5.1 x 12.5 cm, yielding a volume approximate 373 ml; no focal splenic parenchymal abnormality, though there is a small cleft in the lower pole of the spleen.  Diffuse hepatic steatosis without focal hepatic parenchymal abnormality; relative enlargement of the left lobe and caudate lobe and mild irregularity of the hepatic contour.  Patent portal vein.  Moderate ascites throughout the abdomen and pelvis.  Moderate gallbladder distention without evidence for cholelithiasis or acute cholecystitis.  Atrophic pancreatic body, accounting for borderline pancreatic ductal dilation; remainder of the pancreas normal in appearance.  Normal adrenal glands.  Atrophic kidneys bilaterally consistent with the history of end-stage renal disease; no focal renal parenchymal abnormalities.  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  Enlarged retroperitoneal lymph nodes, index left periaortic node just below the renal hilum approximates 2.1 x 1.0 cm (series 2, image 28).  Index portacaval node measures approximately 1.5 x 1.9 cm (image 27).  Surgical clips near the esophagogastric junction without evidence of hiatal hernia.  Stomach decompressed.  Gastric varices suspected.  Normal-appearing small bowel.  Moderately large stool burden throughout the normal appearing colon.  Cecum positioned in the right mid abdomen, extending to near the midline; appendix not visualized, but no pericecal inflammation or edema.  Urinary bladder unremarkable.  Prostate gland and seminal vesicles normal for age.  No evidence of recurrent inguinal  hernia.  Small bilateral pleural effusions, right greater than left, with associated consolidation in the lower lobes.  Heart enlarged with left ventricular predominance.  Bone window images unremarkable apart from possible osteopenia and a a central disc protrusion at the L4-5 level.  IMPRESSION:  1.  Hepatic cirrhosis and hepatic steatosis.  Possible gastric varices.  Moderate ascites. 2.  Distended gallbladder without CT evidence of cholelithiasis or acute cholecystitis.  No biliary ductal dilation. 3.  Atrophy involving the body of the pancreas.  Pancreas otherwise normal. 4.  Atrophic kidneys bilaterally consistent with the history of end- stage renal disease. 5.  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 6.  Bilateral pleural effusions, right greater than left, and pneumonia in the visualized lower lobes. 7.  Scattered mildly enlarged retroperitoneal lymph nodes, statistically reactive nodes. 8.  Large stool burden.  Original Report Authenticated By: Arnell Sieving, M.D.    Medications:  Scheduled:   . calcium acetate  667 mg Oral TID WC  . calcium carbonate  2 tablet Oral BID  . carbamazepine  300 mg Oral BID  . darbepoetin (ARANESP) injection - DIALYSIS  100 mcg Intravenous Q Sat-HD  . escitalopram  20 mg Oral Daily  . feeding supplement  1 Container Oral TID WC  . hydrocortisone  25 mg Rectal BID  . iohexol  20 mL Oral Q1 Hr x 2  . metoprolol  50 mg Oral BID  . nicotine  21 mg Transdermal Daily  . pantoprazole  40 mg Oral Q0600  . paricalcitol  1 mcg Intravenous 3 times weekly  . simvastatin  20 mg Oral QHS  . sodium chloride  3 mL Intravenous Q12H  . 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  This was not noted on the CT scan, i.e., enlarged vessels in the colon or rectum.  I think if he has an acute bleeding episode this weekend an emergent FFS will be beneficial.  HGB is stable at this time.  Plan: 1) Emergent FFS if hematochezia recurs. 2) Continue to monitor HGB and transfuse if needed.  LOS: 2 days   Mathews Stuhr D 12/26/2011, 7:14 AM

## 2011-12-26 NOTE — Progress Notes (Signed)
[facility] Progress Note Subjective:  Wants to eat solids. No blood in stool last evening.  Objective Filed Vitals:   12/25/11 1753 12/25/11 1921 12/25/11 2058 12/26/11 0454  BP: 172/83  172/77 170/86  Pulse: 64  67 70  Temp: 97.5 F (36.4 C)  98.1 F (36.7 C) 98.1 F (36.7 C)  TempSrc: Oral  Oral Oral  Resp: 18  18 18   Height:      Weight:  59.3 kg (130 lb 11.7 oz)    SpO2: 96%  95% 96%   Physical Exam: General: NAD sitting in bed Heart: RRR Lungs: scattered rhonchi  Abdomen: soft NT ND  Extremities: no LE edema; pitting finger nails.  Skin: multiple tattos on arms, hands, trunk  Neuro: alert and oriented  Dialysis Access: left upper AVF patent    Assessment/Plan:  1. GI bleed - acute +/- chronic - Gi consulted; on PPI, CL;Abdominal CT as below 2. ESRD - TTS per routine; no heparin HD  3 Hypertension/volume - on BID lopressor 50 mg and 5 mg norvasc; challenge volume on HD; bilateral pleural effusions on abdominal CT;   4. Anemia - Epo increased recently. IV Fe contraindicated with PCT. Hgb to 11.7 today after transfusion. 8.9 2/14. Continue same ESA equilvalant for now as Hgb likely to drift down - already on no heparin HD as outpt. 5. Metabolic bone disease - Continue zemplar 1 with phoslo binder, check phos  6. Nutrition - on CL, will need high protein renal diet; add resource - high protein CL supplement; Needs diet advanced if no further GI related studies 7. Hx of dialysis noncompliance - habitually signs off treatments early secondary to anxiety and craving tobacco  8. Hx seizure disorder - on carbatrol  9. Anxiety depression - on lexapro and high dose prn valium; meds Rx by Dr. Alroy Dust. Has never seen a therapist. Socially isolated except for dialysis treatments.  10. Hep C + /cirrhosis; ? Gastric varices- LFTs normal.  Additional Objective Labs: Basic Metabolic Panel:  Lab 12/25/11 1610  NA 135  K 4.5  CL 93*  CO2 28  GLUCOSE 96  BUN 34*    CREATININE 4.73*  CALCIUM 8.9  ALB --  PHOS --   Liver Function Tests:  Lab 12/25/11 1038  AST 14  ALT 7  ALKPHOS 138*  BILITOT 0.6  PROT 6.4  ALBUMIN 2.8*  CBC:  Lab 12/25/11 1038 12/25/11 0445 12/24/11 2336  WBC 7.3 -- --  NEUTROABS -- -- --  HGB 11.7* 11.1* 10.6*  HCT 36.4* 34.8* 32.7*  MCV 88.3 -- --  PLT 196 -- --  CBG:  Lab 12/25/11 2136 12/25/11 1649 12/25/11 1158 12/25/11 0756 12/24/11 2104  GLUCAP 75 80 104* 115* 74  Studies/Results: Ct Abdomen Pelvis W Contrast  12/25/2011  IMPRESSION:  1.  Hepatic cirrhosis and hepatic steatosis.  Possible gastric varices.  Moderate ascites. 2.  Distended gallbladder without CT evidence of cholelithiasis or acute cholecystitis.  No biliary ductal dilation. 3.  Atrophy involving the body of the pancreas.  Pancreas otherwise normal. 4.  Atrophic kidneys bilaterally consistent with the history of end- stage renal disease. 5.  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 6.  Bilateral pleural effusions, right greater than left, and pneumonia in the visualized lower lobes. 7.  Scattered mildly enlarged retroperitoneal lymph nodes, statistically reactive nodes. 8.  Large stool burden.  Original Report Authenticated By: Arnell Sieving, M.D.   Medications:      .  calcium acetate  667 mg Oral TID WC  . calcium carbonate  2 tablet Oral BID  . carbamazepine  300 mg Oral BID  . darbepoetin (ARANESP) injection - DIALYSIS  100 mcg Intravenous Q Sat-HD  . escitalopram  20 mg Oral Daily  . feeding supplement  1 Container Oral TID WC  . hydrocortisone  25 mg Rectal BID  . iohexol  20 mL Oral Q1 Hr x 2  . metoprolol  50 mg Oral BID  . nicotine  21 mg Transdermal Daily  . pantoprazole  40 mg Oral Q0600  . paricalcitol  1 mcg Intravenous 3 times weekly  . simvastatin  20 mg Oral QHS  . sodium chloride  3 mL Intravenous Q12H    I  have reviewed scheduled and prn medications.  Sheffield Slider,  PA-C [facility] Beeper [phone]  12/26/2011,10:26 AM  LOS: 2 days  Complains of cough and runny nose.  Had BM last night with no further bleeding.   Dor HD later today.  Agree with plans         c

## 2011-12-26 NOTE — Procedures (Signed)
I was present at this session.  I have reviewed the session itself and made appropriate changes.  Saliyah Gillin L 2/23/20133:15 PM

## 2011-12-26 NOTE — Progress Notes (Addendum)
Patient ID: Sean [name], male   DOB: 15-Mar-1961, 51 y.o.   MRN: [id number]  Subjective: No events overnight. Patient denies chest pain, shortness of breath. Reports epigastric pain which he says its rather well controlled on pain regimen.  Objective:  Vital signs in last 24 hours:  Filed Vitals:   12/26/11 1320 12/26/11 1325 12/26/11 1351 12/26/11 1407  BP: 171/96 171/93 180/94 176/95  Pulse: 75 72 69 70  Temp:      TempSrc:      Resp: 22 18 17 18   Height:      Weight:      SpO2: 90%       Intake/Output from previous day:   Intake/Output Summary (Last 24 hours) at 12/26/11 1433 Last data filed at 12/26/11 0900  Gross per 24 hour  Intake    720 ml  Output      0 ml  Net    720 ml    Physical Exam: General: Alert, awake, oriented x3, in no acute distress. Cachectic HEENT: No bruits, no goiter. Dry mucous membranes, no conjunctival pallor. Heart: Regular rate and rhythm, S1/S2 +, no murmurs, rubs, gallops. Lungs: Clear to auscultation bilaterally with minimal bibasilar crackles. No wheezing, no rhonchi, no rales.  Abdomen: Soft, mildly tender in epigastric area, nondistended, positive bowel sounds. Extremities: No clubbing or cyanosis, no pitting edema,  positive pedal pulses. Neuro: Grossly nonfocal.  Lab Results:  Basic Metabolic Panel:    Component Value Date/Time   NA 135 12/25/2011 1038   K 4.5 12/25/2011 1038   CL 93* 12/25/2011 1038   CO2 28 12/25/2011 1038   BUN 34* 12/25/2011 1038   CREATININE 4.73* 12/25/2011 1038   GLUCOSE 96 12/25/2011 1038   CALCIUM 8.9 12/25/2011 1038   CALCIUM 7.2* 03/03/2010 1023   CBC:    Component Value Date/Time   WBC 8.9 12/26/2011 1345   HGB 11.0* 12/26/2011 1345   HCT 34.2* 12/26/2011 1345   PLT 190 12/26/2011 1345   MCV 86.1 12/26/2011 1345   NEUTROABS 3.4 12/01/2010 0842   LYMPHSABS 3.2 12/01/2010 0842   MONOABS 1.1* 12/01/2010 0842   EOSABS 0.6 12/01/2010 0842   BASOSABS 0.0 12/01/2010 0842      Lab 12/26/11 1345 12/25/11 1038  12/25/11 0445 12/24/11 2336 12/24/11 1736  WBC 8.9 7.3 -- -- --  HGB 11.0* 11.7* 11.1* 10.6* 10.7*  HCT 34.2* 36.4* 34.8* 32.7* 33.1*  PLT 190 196 -- -- --  MCV 86.1 88.3 -- -- --  MCH 27.7 28.4 -- -- --  MCHC 32.2 32.1 -- -- --  RDW 15.2 15.5 -- -- --  LYMPHSABS -- -- -- -- --  MONOABS -- -- -- -- --  EOSABS -- -- -- -- --  BASOSABS -- -- -- -- --  BANDABS -- -- -- -- --    Lab 12/25/11 1038  NA 135  K 4.5  CL 93*  CO2 28  GLUCOSE 96  BUN 34*  CREATININE 4.73*  CALCIUM 8.9  MG --   Recent Results (from the past 240 hour(s))  MRSA PCR SCREENING     Status: Normal   Collection Time   12/24/11  7:37 PM      Component Value Range Status Comment   MRSA by PCR NEGATIVE  NEGATIVE  Final     Studies/Results: Ct Abdomen Pelvis W Contrast 12/25/2011    IMPRESSION:   1.  Hepatic cirrhosis and hepatic steatosis.  Possible gastric varices.  Moderate ascites. 2.  Distended gallbladder without CT evidence of cholelithiasis or acute cholecystitis.  No biliary ductal dilation.  3.  Atrophy involving the body of the pancreas.  Pancreas otherwise normal.  4.  Atrophic kidneys bilaterally consistent with the history of end- stage renal disease.  5.  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  6.  Bilateral pleural effusions, right greater than left, and pneumonia in the visualized lower lobes.  7.  Scattered mildly enlarged retroperitoneal lymph nodes, statistically reactive nodes.  8.  Large stool burden.     Medications: Scheduled Meds:   . calcium acetate  667 mg Oral TID WC  . calcium carbonate  2 tablet Oral BID  . carbamazepine  300 mg Oral BID  . darbepoetin (ARANESP) injection - DIALYSIS  100 mcg Intravenous Q Sat-HD  . escitalopram  20 mg Oral Daily  . feeding supplement  1 Container Oral TID WC  . hydrocortisone  25 mg Rectal BID  . iohexol  20 mL Oral Q1 Hr x 2  . metoprolol  50 mg Oral BID  . nicotine  21 mg Transdermal Daily    . pantoprazole  40 mg Oral Q0600  . paricalcitol  1 mcg Intravenous 3 times weekly  . simvastatin  20 mg Oral QHS  . sodium chloride  3 mL Intravenous Q12H   Continuous Infusions:  PRN Meds:.sodium chloride, sodium chloride, acetaminophen, acetaminophen, albuterol, alteplase, calcium carbonate (dosed in mg elemental calcium), camphor-menthol, diazepam, docusate sodium, heparin, HYDROcodone-acetaminophen, HYDROmorphone, hydrOXYzine, iohexol, lidocaine, lidocaine-prilocaine, ondansetron (ZOFRAN) IV, ondansetron, pentafluoroprop-tetrafluoroeth, sorbitol, zolpidem  Assessment/Plan: *ACUTE GI BLEEDING  - GI following, appreciate input - pt denies additional episodes of hematemesis - Hg/Hct remain stable over 24 hours - Place on clear liquids and IV protonix every 12 hours.  - CBC in AM  Active Problems:  Acute post hemorrhagic anemia complicating chronic anemia secondary to end-stage renal disease:  - Hemoglobin stable  - patient reported no longer bleeding episodes  - transfuse as needed. Follow H&H  END STAGE RENAL DISEASE  - Nephrology following   HYPERTENSION  - Continue metoprolol but will hold amlodipine to avoid precipitous drop if he has further GI bleeding.  - reassess BP in the AM  GERD  - Continue IV PPIs.   SEIZURE DISORDER  - No recent seizures.  - Seizure precautions  - continue Tegretol.    PNA - noted on CT - pt reports subjective fevers at home and intermittently productive cough - will start Avelox for now for 5 days  EDUCATION  - test results and diagnostic studies were discussed with patient at the bedside  - questions were answered at the bedside and contact information was provided for additional questions or concerns   LOS: 2 days   Sean [name] 12/26/2011, 2:33 PM  [facility] Pager: [phone]

## 2011-12-27 LAB — CBC
Hemoglobin: 11.1 g/dL — ABNORMAL LOW (ref 13.0–17.0)
MCH: 28 pg (ref 26.0–34.0)
MCHC: 31.7 g/dL (ref 30.0–36.0)
MCV: 88.4 fL (ref 78.0–100.0)
Platelets: 168 10*3/uL (ref 150–400)
RBC: 3.96 MIL/uL — ABNORMAL LOW (ref 4.22–5.81)

## 2011-12-27 LAB — BASIC METABOLIC PANEL
CO2: 28 mEq/L (ref 19–32)
Calcium: 9.2 mg/dL (ref 8.4–10.5)
Creatinine, Ser: 3.7 mg/dL — ABNORMAL HIGH (ref 0.50–1.35)
GFR calc non Af Amer: 18 mL/min — ABNORMAL LOW (ref >90)
Glucose, Bld: 109 mg/dL — ABNORMAL HIGH (ref 70–99)

## 2011-12-27 MED ORDER — AMLODIPINE BESYLATE 10 MG PO TABS
10.0000 mg | ORAL_TABLET | Freq: Every day | ORAL | Status: DC
  Administered 2011-12-28: 10 mg via ORAL
  Filled 2011-12-27: qty 1

## 2011-12-27 MED ORDER — DIPHENHYDRAMINE HCL 50 MG/ML IJ SOLN: 12.5000 mg | Freq: Four times a day (QID) | INTRAMUSCULAR | Status: DC | PRN

## 2011-12-27 MED ORDER — MORPHINE SULFATE CR 15 MG PO TB12
15.0000 mg | ORAL_TABLET | Freq: Two times a day (BID) | ORAL | Status: DC
  Administered 2011-12-27 – 2011-12-28 (×3): 15 mg via ORAL
  Filled 2011-12-27 (×3): qty 1

## 2011-12-27 MED ORDER — AMLODIPINE BESYLATE 5 MG PO TABS
5.0000 mg | ORAL_TABLET | Freq: Every day | ORAL | Status: DC
  Administered 2011-12-27: 5 mg via ORAL
  Filled 2011-12-27: qty 1

## 2011-12-27 NOTE — Progress Notes (Signed)
[facility] Progress Note Subjective:  Ate 100 % breakfast.  Not problems with HD yesterday.  Feeling better. Tells me the GI MD said more IV antibiotics and possible d/c Monday.  Objective Filed Vitals:   12/26/11 1705 12/26/11 1826 12/26/11 2130 12/27/11 0550  BP: 191/91 156/85 169/76 172/87  Pulse: 72 80 73 72  Temp: 97.7 F (36.5 C) 98.1 F (36.7 C) 98.2 F (36.8 C) 97.5 F (36.4 C)  TempSrc: Oral Oral Oral Oral  Resp: 16 18 18 18   Height:      Weight: 57.4 kg (126 lb 8.7 oz)  57 kg (125 lb 10.6 oz)   SpO2: 100% 100% 98% 97%   Physical Exam: General: Emaciated NAD sitting in bed  Heart: RRR  Lungs: scattered rhonchi - clears somewhat with coughing Abdomen: soft NT ND  Extremities: no LE edema Neuro: alert and oriented  Dialysis Access: left upper AVF patent   Problem/Plan: 1. GI bleed - acute +/- chronic - Gi consulted; on PPI, hydrocortisone suppositories 2. ESRD - TTS per routine; no heparin HD - was no heparin prior to admission 3 Hypertension/volume - on BID lopressor 50 mg; challenge volume on HD; bilateral pleural effusions on abdominal CT; still quite hypertensive; resume norvasc 5 mg per day Post HD wt was 57. Net UF 3.9 Still above EDW of 55.5. Consider redialyze Monday for volume and check CXR post to evaluate pleural effusions (and pneumonia). 4. Anemia - Epo increased recently. IV Fe contraindicated with PCT. Hgb to 11.0 today after transfusion. 8.9 2/14 Stable x 2 days.  Continue same ESA equilvalant for now  5. Metabolic bone disease - Continue zemplar 1 with phoslo binder, check phos  6. Nutrition - tolerating solids; high protein renal diet  7. Hx of dialysis noncompliance - habitually signs off treatments early secondary to anxiety and craving tobacco  8. Hx seizure disorder - on carbamazepine 9. Anxiety depression - on lexapro and high dose prn valium; meds Rx by Dr. Alroy Dust. Has never seen a therapist. Socially isolated except for  dialysis treatments.  10. Hep C + /cirrhosis; ? Gastric varices- LFTs normal.  11. Bilateral PNA - per CT; On IV avelox started 2/23   Additional Objective Labs: Basic Metabolic Panel:  Lab 12/27/11 9604 12/26/11 1345 12/25/11 1038  NA 136 129* 135  K 4.3 4.4 4.5  CL 98 89* 93*  CO2 28 25 28   GLUCOSE 109* 94 96  BUN 19 44* 34*  CREATININE 3.70* 6.01* 4.73*  CALCIUM 9.2 8.7 8.9  ALB -- -- --  PHOS -- 4.6 --   Liver Function Tests:  Lab 12/26/11 1345 12/25/11 1038  AST -- 14  ALT -- 7  ALKPHOS -- 138*  BILITOT -- 0.6  PROT -- 6.4  ALBUMIN 2.8* 2.8*  CBC:  Lab 12/27/11 0620 12/26/11 1345 12/25/11 1038  WBC 7.0 8.9 7.3  NEUTROABS -- -- --  HGB 11.1* 11.0* 11.7*  HCT 35.0* 34.2* 36.4*  MCV 88.4 86.1 88.3  PLT 168 190 196  CBG:  Lab 12/25/11 2136 12/25/11 1649 12/25/11 1158 12/25/11 0756 12/24/11 2104  GLUCAP 75 80 104* 115* 74    Medications:      . calcium acetate  667 mg Oral TID WC  . calcium carbonate  2 tablet Oral BID  . carbamazepine  300 mg Oral BID  . darbepoetin      . darbepoetin (ARANESP) injection - DIALYSIS  100 mcg Intravenous Q Sat-HD  . escitalopram  20 mg Oral Daily  . feeding supplement  1 Container Oral TID WC  . hydrocortisone  25 mg Rectal BID  . metoprolol  50 mg Oral BID  . morphine  15 mg Oral Q12H  . moxifloxacin  400 mg Intravenous Q24H  . nicotine  21 mg Transdermal Daily  . pantoprazole  40 mg Oral Q0600  . paricalcitol  1 mcg Intravenous 3 times weekly  . simvastatin  20 mg Oral QHS  . sodium chloride  3 mL Intravenous Q12H    I  have reviewed scheduled and prn medications.  Sheffield Slider, PA-C [facility] Beeper [phone]  12/27/2011,9:29 AM  LOS: 3 days   Agree with plans to decrease EDW.  He wants "nerve pill" to take prior to dialysis "fidgety."  I'm reluctant to do this with liver disease and already on diazepam and lexapro.

## 2011-12-27 NOTE — Progress Notes (Signed)
Patient ID: Sean [name], male   DOB: [birth year], 51 y.o.   MRN: [id number] Subjective: No bowel movements.  Objective: Vital signs in last 24 hours: Temp:  [97.5 F (36.4 C)-99.5 F (37.5 C)] 97.5 F (36.4 C) (02/24 0550) Pulse Rate:  [68-80] 72  (02/24 0550) Resp:  [16-22] 18  (02/24 0550) BP: (155-191)/(76-96) 172/87 mmHg (02/24 0550) SpO2:  [83 %-100 %] 97 % (02/24 0550) Weight:  [57 kg (125 lb 10.6 oz)-62.1 kg (136 lb 14.5 oz)] 57 kg (125 lb 10.6 oz) (02/23 2130) Last BM Date: 12/26/11  Intake/Output from previous day: 02/23 0701 - 02/24 0700 In: 1930 [P.O.:1680; IV Piggyback:250] Out: 3903  Intake/Output this shift:    General appearance: alert and no distress Resp: clear to auscultation bilaterally Cardio: regular rate and rhythm GI: soft, non-tender; bowel sounds normal; no masses,  no organomegaly Extremities: extremities normal, atraumatic, no cyanosis or edema  Lab Results:  Basename 12/27/11 0620 12/26/11 1345 12/25/11 1038  WBC 7.0 8.9 7.3  HGB 11.1* 11.0* 11.7*  HCT 35.0* 34.2* 36.4*  PLT 168 190 196   BMET  Basename 12/26/11 1345 12/25/11 1038  NA 129* 135  K 4.4 4.5  CL 89* 93*  CO2 25 28  GLUCOSE 94 96  BUN 44* 34*  CREATININE 6.01* 4.73*  CALCIUM 8.7 8.9   LFT  Basename 12/26/11 1345 12/25/11 1038  PROT -- 6.4  ALBUMIN 2.8* --  AST -- 14  ALT -- 7  ALKPHOS -- 138*  BILITOT -- 0.6  BILIDIR -- --  IBILI -- --   PT/INR No results found for this basename: LABPROT:2,INR:2 in the last 72 hours Hepatitis Panel No results found for this basename: HEPBSAG,HCVAB,HEPAIGM,HEPBIGM in the last 72 hours C-Diff No results found for this basename: CDIFFTOX:3 in the last 72 hours Fecal Lactopherrin No results found for this basename: FECLLACTOFRN in the last 72 hours  Studies/Results: Ct Abdomen Pelvis W Contrast  12/25/2011  *RADIOLOGY REPORT*  Clinical Data: Rectal bleeding with drop in hemoglobin.  Lower abdominal pain radiating to the low  back.  History of hepatitis C and end-stage renal disease.  Surgical history includes inguinal hernia repair, Nissen fundoplication, and orchiopexy.  CT ABDOMEN AND PELVIS WITH CONTRAST 12/25/2011:  Technique:  Multidetector CT imaging of the abdomen and pelvis was performed following the standard protocol during bolus administration of intravenous contrast.  Contrast: OMNIPAQUE IOHEXOL 300 MG/ML IV. Oral contrast was also administered.  Comparison: No prior CT.  Abdominal ultrasound 08/02/2008 [facility], 05/23/2007 [facility].  Findings: Spleen upper normal in size measuring approximate 11.7 x 5.1 x 12.5 cm, yielding a volume approximate 373 ml; no focal splenic parenchymal abnormality, though there is a small cleft in the lower pole of the spleen.  Diffuse hepatic steatosis without focal hepatic parenchymal abnormality; relative enlargement of the left lobe and caudate lobe and mild irregularity of the hepatic contour.  Patent portal vein.  Moderate ascites throughout the abdomen and pelvis.  Moderate gallbladder distention without evidence for cholelithiasis or acute cholecystitis.  Atrophic pancreatic body, accounting for borderline pancreatic ductal dilation; remainder of the pancreas normal in appearance.  Normal adrenal glands.  Atrophic kidneys bilaterally consistent with the history of end-stage renal disease; no focal renal parenchymal abnormalities.  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  Enlarged retroperitoneal lymph nodes, index left periaortic node just below the renal hilum approximates 2.1 x 1.0 cm (series 2, image 28).  Index portacaval node measures  approximately 1.5 x 1.9 cm (image 27).  Surgical clips near the esophagogastric junction without evidence of hiatal hernia.  Stomach decompressed.  Gastric varices suspected.  Normal-appearing small bowel.  Moderately large stool burden throughout the normal appearing colon.  Cecum  positioned in the right mid abdomen, extending to near the midline; appendix not visualized, but no pericecal inflammation or edema.  Urinary bladder unremarkable.  Prostate gland and seminal vesicles normal for age.  No evidence of recurrent inguinal hernia.  Small bilateral pleural effusions, right greater than left, with associated consolidation in the lower lobes.  Heart enlarged with left ventricular predominance.  Bone window images unremarkable apart from possible osteopenia and a a central disc protrusion at the L4-5 level.  IMPRESSION:  1.  Hepatic cirrhosis and hepatic steatosis.  Possible gastric varices.  Moderate ascites. 2.  Distended gallbladder without CT evidence of cholelithiasis or acute cholecystitis.  No biliary ductal dilation. 3.  Atrophy involving the body of the pancreas.  Pancreas otherwise normal. 4.  Atrophic kidneys bilaterally consistent with the history of end- stage renal disease. 5.  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 6.  Bilateral pleural effusions, right greater than left, and pneumonia in the visualized lower lobes. 7.  Scattered mildly enlarged retroperitoneal lymph nodes, statistically reactive nodes. 8.  Large stool burden.  Original Report Authenticated By: Arnell Sieving, M.D.    Medications:  Scheduled:   . calcium acetate  667 mg Oral TID WC  . calcium carbonate  2 tablet Oral BID  . carbamazepine  300 mg Oral BID  . darbepoetin      . darbepoetin (ARANESP) injection - DIALYSIS  100 mcg Intravenous Q Sat-HD  . escitalopram  20 mg Oral Daily  . feeding supplement  1 Container Oral TID WC  . hydrocortisone  25 mg Rectal BID  . metoprolol  50 mg Oral BID  . moxifloxacin  400 mg Intravenous Q24H  . nicotine  21 mg Transdermal Daily  . pantoprazole  40 mg Oral Q0600  . paricalcitol  1 mcg Intravenous 3 times weekly  . simvastatin  20 mg Oral QHS  . sodium chloride  3 mL Intravenous Q12H   Continuous:    Assessment/Plan: 1) Hematocheiza  2) HCV cirrhosis  3) Ascites  4) Possible gastric varices    No bleeding at this time.  I think it will be prudent to wait another day.  Plan:  1) Emergent FFS if hematochezia recurs.  2) Continue to monitor HGB and transfuse if needed.   LOS: 3 days   Renato Spellman D 12/27/2011, 7:11 AM

## 2011-12-27 NOTE — Progress Notes (Signed)
PT Cancellation Note  Treatment cancelled today due to pt at baseline functional level. Spoke with RN and pt regarding functional status. Pt feels as though he does not need PT. Signing off, please reorder if necessary.  Thanks, 12/27/2011 [facility] DPT PAGER: [phone] OFFICE: [phone]    [facility] 12/27/2011, 3:23 PM

## 2011-12-27 NOTE — Progress Notes (Signed)
Patient ID: Sean [name], male   DOB: 1961/06/01, 51 y.o.   MRN: [id number]  Subjective: No events overnight. Patient denies chest pain, shortness of breath, abdominal pain.  Objective:  Vital signs in last 24 hours:  Filed Vitals:   12/27/11 0550 12/27/11 1047 12/27/11 1410 12/27/11 1700  BP: 172/87 155/69 158/69 177/74  Pulse: 72 67 63 71  Temp: 97.5 F (36.4 C) 98.6 F (37 C) 98.1 F (36.7 C) 98.2 F (36.8 C)  TempSrc: Oral Oral Oral Oral  Resp: 18 18 18 18   Height:      Weight:      SpO2: 97% 100% 99% 97%    Intake/Output from previous day:   Intake/Output Summary (Last 24 hours) at 12/27/11 2049 Last data filed at 12/27/11 1900  Gross per 24 hour  Intake   1333 ml  Output      0 ml  Net   1333 ml    Physical Exam: General: Alert, awake, oriented x3, in no acute distress. Cachectic HEENT: No bruits, no goiter. Moist mucous membranes, scleral icterus, no conjunctival pallor. Heart: Regular rate and rhythm, S1/S2 +, no murmurs, rubs, gallops. Lungs: Clear to auscultation bilaterally. No wheezing, no rhonchi, no rales.  Abdomen: Soft, nontender, nondistended, positive bowel sounds. Extremities: No clubbing or cyanosis, no pitting edema,  positive pedal pulses. Neuro: Grossly nonfocal.  Lab Results:  Basic Metabolic Panel:    Component Value Date/Time   NA 136 12/27/2011 0620   K 4.3 12/27/2011 0620   CL 98 12/27/2011 0620   CO2 28 12/27/2011 0620   BUN 19 12/27/2011 0620   CREATININE 3.70* 12/27/2011 0620   GLUCOSE 109* 12/27/2011 0620   CALCIUM 9.2 12/27/2011 0620   CALCIUM 7.2* 03/03/2010 1023   CBC:    Component Value Date/Time   WBC 7.0 12/27/2011 0620   HGB 11.1* 12/27/2011 0620   HCT 35.0* 12/27/2011 0620   PLT 168 12/27/2011 0620   MCV 88.4 12/27/2011 0620   NEUTROABS 3.4 12/01/2010 0842   LYMPHSABS 3.2 12/01/2010 0842   MONOABS 1.1* 12/01/2010 0842   EOSABS 0.6 12/01/2010 0842   BASOSABS 0.0 12/01/2010 0842      Lab 12/27/11 0620 12/26/11 1345 12/25/11  1038 12/25/11 0445 12/24/11 2336  WBC 7.0 8.9 7.3 -- --  HGB 11.1* 11.0* 11.7* 11.1* 10.6*  HCT 35.0* 34.2* 36.4* 34.8* 32.7*  PLT 168 190 196 -- --  MCV 88.4 86.1 88.3 -- --  MCH 28.0 27.7 28.4 -- --  MCHC 31.7 32.2 32.1 -- --  RDW 15.3 15.2 15.5 -- --  LYMPHSABS -- -- -- -- --  MONOABS -- -- -- -- --  EOSABS -- -- -- -- --  BASOSABS -- -- -- -- --  BANDABS -- -- -- -- --    Lab 12/27/11 0620 12/26/11 1345 12/25/11 1038  NA 136 129* 135  K 4.3 4.4 4.5  CL 98 89* 93*  CO2 28 25 28   GLUCOSE 109* 94 96  BUN 19 44* 34*  CREATININE 3.70* 6.01* 4.73*  CALCIUM 9.2 8.7 8.9  MG -- -- --    Recent Results (from the past 240 hour(s))  MRSA PCR SCREENING     Status: Normal   Collection Time   12/24/11  7:37 PM      Component Value Range Status Comment   MRSA by PCR NEGATIVE  NEGATIVE  Final     Studies/Results: No results found.  Medications: Scheduled Meds:   . amLODipine  5 mg Oral Daily  . calcium acetate  667 mg Oral TID WC  . calcium carbonate  2 tablet Oral BID  . carbamazepine  300 mg Oral BID  . darbepoetin      . darbepoetin (ARANESP) injection - DIALYSIS  100 mcg Intravenous Q Sat-HD  . escitalopram  20 mg Oral Daily  . feeding supplement  1 Container Oral TID WC  . hydrocortisone  25 mg Rectal BID  . metoprolol  50 mg Oral BID  . morphine  15 mg Oral Q12H  . moxifloxacin  400 mg Intravenous Q24H  . nicotine  21 mg Transdermal Daily  . pantoprazole  40 mg Oral Q0600  . paricalcitol  1 mcg Intravenous 3 times weekly  . simvastatin  20 mg Oral QHS  . sodium chloride  3 mL Intravenous Q12H   Continuous Infusions:  PRN Meds:.sodium chloride, sodium chloride, albuterol, calcium carbonate (dosed in mg elemental calcium), camphor-menthol, diazepam, diphenhydrAMINE, docusate sodium, heparin, HYDROmorphone, hydrOXYzine, lidocaine, lidocaine-prilocaine, ondansetron (ZOFRAN) IV, ondansetron, pentafluoroprop-tetrafluoroeth, sorbitol, zolpidem, DISCONTD: acetaminophen,  DISCONTD: acetaminophen, DISCONTD: HYDROcodone-acetaminophen  Assessment/Plan:  ACUTE GI BLEEDING  - GI following, appreciate input  - pt denies additional episodes of hematemesis  - Hg/Hct remain stable over 24 hours  - Place on clear liquids and IV protonix every 12 hours.  - advance diet - CBC in AM   Active Problems:  Acute post hemorrhagic anemia complicating chronic anemia secondary to end-stage renal disease:  - Hemoglobin stable  - patient reported no longer bleeding episodes  - transfuse as needed. Follow H&H   END STAGE RENAL DISEASE  - Nephrology following   HYPERTENSION  - Continue metoprolol but will add home med amlodipine - reassess BP in the AM   GERD  - Continue IV PPIs.   SEIZURE DISORDER  - No recent seizures.  - Seizure precautions  - continue Tegretol.   PNA  - noted on CT  - pt reports subjective fevers at home and intermittently productive cough  - will start Avelox for now for 5 days, day 2/5  EDUCATION  - test results and diagnostic studies were discussed with patient at the bedside  - questions were answered at the bedside and contact information was provided for additional questions or concerns    LOS: 3 days   MAGICK-Katriona Schmierer 12/27/2011, 8:49 PM  [facility] Pager: [phone]

## 2011-12-28 ENCOUNTER — Encounter: Payer: Self-pay | Admitting: Gastroenterology

## 2011-12-28 DIAGNOSIS — D62 Acute posthemorrhagic anemia: Secondary | ICD-10-CM

## 2011-12-28 DIAGNOSIS — N039 Chronic nephritic syndrome with unspecified morphologic changes: Secondary | ICD-10-CM

## 2011-12-28 DIAGNOSIS — N189 Chronic kidney disease, unspecified: Secondary | ICD-10-CM

## 2011-12-28 LAB — CBC
HCT: 34.4 % — ABNORMAL LOW (ref 39.0–52.0)
MCH: 27.7 pg (ref 26.0–34.0)
MCHC: 31.7 g/dL (ref 30.0–36.0)
MCV: 87.5 fL (ref 78.0–100.0)
RDW: 15.2 % (ref 11.5–15.5)

## 2011-12-28 LAB — RENAL FUNCTION PANEL
Albumin: 2.9 g/dL — ABNORMAL LOW (ref 3.5–5.2)
BUN: 38 mg/dL — ABNORMAL HIGH (ref 6–23)
Calcium: 9.3 mg/dL (ref 8.4–10.5)
Glucose, Bld: 99 mg/dL (ref 70–99)
Phosphorus: 3.9 mg/dL (ref 2.3–4.6)

## 2011-12-28 MED ORDER — HYDROMORPHONE HCL 2 MG PO TABS: 2.0000 mg | ORAL_TABLET | ORAL | Status: AC | PRN

## 2011-12-28 MED ORDER — PANTOPRAZOLE SODIUM 40 MG PO TBEC: 40.0000 mg | DELAYED_RELEASE_TABLET | Freq: Every day | ORAL | Status: DC

## 2011-12-28 MED ORDER — MORPHINE SULFATE CR 15 MG PO TB12: 15.0000 mg | ORAL_TABLET | Freq: Two times a day (BID) | ORAL | Status: AC

## 2011-12-28 MED ORDER — DIAZEPAM 10 MG PO TABS: 10.0000 mg | ORAL_TABLET | Freq: Three times a day (TID) | ORAL | Status: AC | PRN

## 2011-12-28 MED ORDER — HYDROCORTISONE ACETATE 25 MG RE SUPP: 25.0000 mg | Freq: Two times a day (BID) | RECTAL | Status: AC

## 2011-12-28 MED ORDER — MOXIFLOXACIN HCL 400 MG PO TABS: 400.0000 mg | ORAL_TABLET | Freq: Every day | ORAL | Status: AC

## 2011-12-28 MED ORDER — ZOLPIDEM TARTRATE 5 MG PO TABS: 5.0000 mg | ORAL_TABLET | Freq: Every evening | ORAL | Status: DC | PRN

## 2011-12-28 NOTE — Progress Notes (Signed)
[facility] Progress Note Subjective:  I'm going home today.  Objective Filed Vitals:   12/27/11 1700 12/27/11 2204 12/28/11 0441 12/28/11 0945  BP: 177/74 160/71 174/83 171/88  Pulse: 71 62 67 70  Temp: 98.2 F (36.8 C) 98.4 F (36.9 C) 98 F (36.7 C) 98.6 F (37 C)  TempSrc: Oral Oral Oral Oral  Resp: 18 18 17 18   Height:      Weight:  58.8 kg (129 lb 10.1 oz)    SpO2: 97% 99% 92% 97%   Physical Exam: General: Emaciated walking in room  Heart: RRR  Lungs: left lower to mid sided rhonchi Extremities: no LE edema  Dialysis Access: left upper AVF patent   Problem/Plan: 1. Bilateral PNA - per CT; On IV avelox started 2/23 2. Chronic GI bleeding, episodic. Current episode no different than usual per his report. Hx of internal hemorrhoids may explain this, possibly lower level diverticular bleed.  Colonoscopy planned to follow his of adenomatous polyps in 01/2010 on 3/27.  3  Hypertension/volume - on BID lopressor 50 mg; challenge volume on HD; bilateral pleural effusions on abdominal CT; still quite hypertensive; resume norvasc 5 mg per day. Post HD wt was 57. Net UF 3.9 Still above EDW of 55.5. Needs EDW lowered serially at outpt dialysis.  Will inform his center.   4. Anemia - Epo increased recently. IV Fe contraindicated with PCT. Hgb to 11.0 today after transfusion. 8.9 2/14 Stable x 2 days. Continue same ESA equilvalant for now  5. Metabolic bone disease - Continue zemplar 1 with phoslo binder, check phos  6. Nutrition - tolerating solids; high protein renal diet  7. Hx of dialysis noncompliance - habitually signs off treatments early secondary to anxiety and craving tobacco  8. Hx seizure disorder - on carbamazepine  9. Anxiety depression - on lexapro and high dose prn valium; meds Rx by Dr. Alroy Dust. Has never seen a therapist. Socially isolated except for  dialysis treatments.  10. ESRD - TTS per routine; no heparin HD - was no heparin prior to admission    10. Hep C + /cirrhosis; ? Gastric varices- LFTs normal.    Additional Objective Labs: Basic Metabolic Panel:  Lab 12/28/11 1610 12/27/11 0620 12/26/11 1345  NA 134* 136 129*  K 4.8 4.3 4.4  CL 96 98 89*  CO2 29 28 25   GLUCOSE 99 109* 94  BUN 38* 19 44*  CREATININE 5.36* 3.70* 6.01*  CALCIUM 9.3 9.2 8.7  ALB -- -- --  PHOS 3.9 -- 4.6   Liver Function Tests:  Lab 12/28/11 0623 12/26/11 1345 12/25/11 1038  AST -- -- 14  ALT -- -- 7  ALKPHOS -- -- 138*  BILITOT -- -- 0.6  PROT -- -- 6.4  ALBUMIN 2.9* 2.8* 2.8*  CBC:  Lab 12/28/11 0623 12/27/11 0620 12/26/11 1345 12/25/11 1038  WBC 7.6 7.0 8.9 --  NEUTROABS -- -- -- --  HGB 10.9* 11.1* 11.0* --  HCT 34.4* 35.0* 34.2* --  MCV 87.5 88.4 86.1 88.3  PLT 161 168 190 --  CBG:  Lab 12/25/11 2136 12/25/11 1649 12/25/11 1158 12/25/11 0756 12/24/11 2104  GLUCAP 75 80 104* 115* 74  Medications:      . amLODipine  10 mg Oral Daily  . calcium acetate  667 mg Oral TID WC  . calcium carbonate  2 tablet Oral BID  . carbamazepine  300 mg Oral BID  . darbepoetin (ARANESP) injection - DIALYSIS  100 mcg Intravenous  Q Sat-HD  . escitalopram  20 mg Oral Daily  . feeding supplement  1 Container Oral TID WC  . hydrocortisone  25 mg Rectal BID  . metoprolol  50 mg Oral BID  . morphine  15 mg Oral Q12H  . moxifloxacin  400 mg Intravenous Q24H  . nicotine  21 mg Transdermal Daily  . pantoprazole  40 mg Oral Q0600  . paricalcitol  1 mcg Intravenous 3 times weekly  . simvastatin  20 mg Oral QHS  . sodium chloride  3 mL Intravenous Q12H  . DISCONTD: amLODipine  5 mg Oral Daily    I  have reviewed scheduled and prn medications.  Sheffield Slider, PA-C [facility] Beeper [phone]  12/28/2011,10:32 AM  LOS: 4 days   Patient seen and examined and agree with assessment and plan as above.   Vinson Moselle  MD [facility] [phone] pgr    [phone] cell 12/28/2011, 11:10 AM

## 2011-12-28 NOTE — Progress Notes (Signed)
Pt. discharged to home after d/c summary reviewed and pt capable of re verbalizing medications and follow up appointments. Pt remains stable. No signs and symptoms of distress. Educated to return to ER in the event of SOB, dizziness, chest pain, or fainting. Francille Wittmann, RN  

## 2011-12-28 NOTE — Discharge Summary (Signed)
Patient ID: Sean [name] MRN: [id number] DOB/AGE: August 03, 1961 50 y.o.  Admit date: 12/24/2011 Discharge date: 12/28/2011  Primary Care Physician:  Michele Mcalpine, MD, MD  Discharge Diagnoses:    Present on Admission:  .Acute GI bleeding .Anemia, posthemorrhagic, acute .GERD .HYPERTENSION .HYPOTHYROIDISM, BORDERLINE .SEIZURE DISORDER .RENAL FAILURE, END STAGE .Abdominal pain, left lower quadrant .PNA (pneumonia) .Constipation .HEPATITIS C  Principal Problem:  *Acute GI bleeding Active Problems:  Anemia, posthemorrhagic, acute  PNA (pneumonia)  Constipation  HEPATITIS C  HYPERTENSION  RENAL FAILURE, END STAGE  HYPOTHYROIDISM, BORDERLINE  GERD  SEIZURE DISORDER  Abdominal pain, left lower quadrant   Medication List  As of 12/28/2011  8:40 AM   ASK your doctor about these medications         amLODipine 5 MG tablet   Commonly known as: NORVASC   Take 5 mg by mouth daily.      Calcium Acetate 667 MG Tabs   Take 1 tablet by mouth 3 (three) times daily.      calcium carbonate 500 MG chewable tablet   Commonly known as: TUMS - dosed in mg elemental calcium   Chew 2 tablets by mouth 2 (two) times daily.      carbamazepine 300 MG 12 hr capsule   Commonly known as: CARBATROL   Take 300 mg by mouth 2 (two) times daily.      diazepam 10 MG tablet   Commonly known as: VALIUM   Take 1 tablet (10 mg total) by mouth 3 (three) times daily as needed. As needed for nerves      escitalopram 20 MG tablet   Commonly known as: LEXAPRO   Take 1 tablet (20 mg total) by mouth daily.      hydrOXYzine 25 MG tablet   Commonly known as: ATARAX/VISTARIL   Take 25 mg by mouth every 6 (six) hours as needed. For itching      metoprolol 50 MG tablet   Commonly known as: LOPRESSOR   Take 50 mg by mouth 2 (two) times daily.      simvastatin 20 MG tablet   Commonly known as: ZOCOR   Take 20 mg by mouth at bedtime.            Disposition and Follow-up: With PCP in 3-4 weeks. Pain  clinic in 1 month [phone] for further recommendations on pain control.  Consults:  GI for acute hematemesis and nephrology for HD  Significant Diagnostic Studies:  CT abdomen and pelvis: IMPRESSION:  1. Hepatic cirrhosis and hepatic steatosis. Possible gastric  varices. Moderate ascites.  2. Distended gallbladder without CT evidence of cholelithiasis or  acute cholecystitis. No biliary ductal dilation.  3. Atrophy involving the body of the pancreas. Pancreas otherwise  normal.  4. Atrophic kidneys bilaterally consistent with the history of end-  stage renal disease.  5. 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  6. Bilateral pleural effusions, right greater than left, and  pneumonia in the visualized lower lobes.  7. Scattered mildly enlarged retroperitoneal lymph nodes,  statistically reactive nodes.  8. Large stool burden.  Brief H and P: 51 year old Caucasian male patient who has extensive past medical history including end-stage renal disease on hemodialysis Tuesdays, Thursdays and Saturdays, hepatitis C, hypertension, hyperlipidemia, chronic pain, hypothyroid, diverticulosis, gastroesophageal reflux disease, seizure disorder, headaches, anxiety and depression, porphyria cutanea tarda, ongoing tobacco abuse who was transferred from [facility] secondary to above complaints. Patient complains of approximately one week of generalized weakness and occasional  dizziness. He also complains of intermittent throbbing lower abdominal pain which at times gets severe at 10/10 with radiation to lower back. No relieving factors. Made worse on straining. Appetite has been poor since he started dialysis approximately year and a half ago. No weight loss. One episode of large amount of dark blood with emesis night before admission. He denies abusing of using NSAIDs. He had 2 hours of dialysis the day of admission. At dialysis his hemoglobin was noted to  be 8.8 with baseline probably in the 9-10 g/dL range. Due to his history of rectal bleeding and drop in his hemoglobin, he was transferred to [facility] and eventually transferred to [facility]. Currently he is almost through with his second unit of packed red blood cell transfusion and indicates that he feels stronger with no further dizziness.  Physical Exam on Discharge:  Filed Vitals:   12/27/11 1410 12/27/11 1700 12/27/11 2204 12/28/11 0441  BP: 158/69 177/74 160/71 174/83  Pulse: 63 71 62 67  Temp: 98.1 F (36.7 C) 98.2 F (36.8 C) 98.4 F (36.9 C) 98 F (36.7 C)  TempSrc: Oral Oral Oral Oral  Resp: 18 18 18 17   Height:      Weight:   58.8 kg (129 lb 10.1 oz)   SpO2: 99% 97% 99% 92%     Intake/Output Summary (Last 24 hours) at 12/28/11 0840 Last data filed at 12/27/11 1900  Gross per 24 hour  Intake    733 ml  Output      0 ml  Net    733 ml    General: Alert, awake, oriented x3, in no acute distress. Cachectic HEENT: No bruits, no goiter.Scleral icterus noted and dry MM Heart: Regular rate and rhythm, without murmurs, rubs, gallops. Lungs: Clear to auscultation bilaterally. Abdomen: Soft, nontender, nondistended, positive bowel sounds. Extremities: No clubbing cyanosis or edema with positive pedal pulses. Neuro: Grossly intact, nonfocal.  CBC:    Component Value Date/Time   WBC 7.6 12/28/2011 0623   HGB 10.9* 12/28/2011 0623   HCT 34.4* 12/28/2011 0623   PLT 161 12/28/2011 0623   MCV 87.5 12/28/2011 0623   NEUTROABS 3.4 12/01/2010 0842   LYMPHSABS 3.2 12/01/2010 0842   MONOABS 1.1* 12/01/2010 0842   EOSABS 0.6 12/01/2010 0842   BASOSABS 0.0 12/01/2010 0842    Basic Metabolic Panel:    Component Value Date/Time   NA 134* 12/28/2011 0623   K 4.8 12/28/2011 0623   CL 96 12/28/2011 0623   CO2 29 12/28/2011 0623   BUN 38* 12/28/2011 0623   CREATININE 5.36* 12/28/2011 0623   GLUCOSE 99 12/28/2011 0623   CALCIUM 9.3 12/28/2011 0623   CALCIUM 7.2* 03/03/2010  1023    Hospital Course:  Principal Problem:  *Acute GI bleeding - GI was consulted and recommendation was to follow up on daily CBC to ensure that it stays stable - please note the above findings on CT abdomen and pelvis - pt has responded well to fluids and his Hg/Hct remained stable during the stay - no additional blood stools or hematemesis was noted  Active Problems:  Anemia, posthemorrhagic, acute - please see principal problem - Hg/Hct remained stable   PNA (pneumonia) - this was noted on CT abdomen and pelvis - we treated empirically and pt will go home on Avelox to complete 5 days course of antibiotics   Constipation - bowel regimen established and pt has responded well  Time spent on Discharge: Over 30 minutes  Signed:  MAGICK-Bryndan Bilyk 12/28/2011, 8:40 AM  [facility]  [phone]

## 2011-12-28 NOTE — Consult Note (Signed)
Agree with Ms. Gribbin's assessment and plan. Myisha Pickerel E. Margurete Guaman, MD, FACG  

## 2011-12-28 NOTE — Consult Note (Signed)
[facility] Daily Rounding Note 12/28/2011, 8:33 AM  SUBJECTIVE:       No further bleeding.  Back pain persists, not as bad.  In retrospect thinks that it may be a result of falling, losing balance at home.   OBJECTIVE:        General: Looks unwell, same as before.  Not acutely ill looking     Vital signs in last 24 hours:    Temp:  [98 F (36.7 C)-98.6 F (37 C)] 98 F (36.7 C) (02/25 0441) Pulse Rate:  [62-71] 67  (02/25 0441) Resp:  [17-18] 17  (02/25 0441) BP: (155-177)/(69-83) 174/83 mmHg (02/25 0441) SpO2:  [92 %-100 %] 92 % (02/25 0441) Weight:  [129 lb 10.1 oz (58.8 kg)] 129 lb 10.1 oz (58.8 kg) (02/24 2204) Last BM Date: 12/26/11  Heart: RRR Chest: Clear but diminished BS Bilateraly.  Abdomen: soft, NT, ND.  Active BS  Extremities: no edema Neuro/Psych:  Pleasant.  Not agitated.    Intake/Output from previous day: 02/24 0701 - 02/25 0700 In: 733 [P.O.:480; I.V.:3; IV Piggyback:250] Out: -   Intake/Output this shift:    Lab Results:  Basename 12/28/11 0623 12/27/11 0620 12/26/11 1345  WBC 7.6 7.0 8.9  HGB 10.9* 11.1* 11.0*  HCT 34.4* 35.0* 34.2*  PLT 161 168 190   BMET  Basename 12/28/11 0623 12/27/11 0620 12/26/11 1345  NA 134* 136 129*  K 4.8 4.3 4.4  CL 96 98 89*  CO2 29 28 25   GLUCOSE 99 109* 94  BUN 38* 19 44*  CREATININE 5.36* 3.70* 6.01*  CALCIUM 9.3 9.2 8.7   LFT  Basename 12/28/11 0623 12/26/11 1345 12/25/11 1038  PROT -- -- 6.4  ALBUMIN 2.9* 2.8* 2.8*  AST -- -- 14  ALT -- -- 7  ALKPHOS -- -- 138*  BILITOT -- -- 0.6  BILIDIR -- -- --  IBILI -- -- --    Studies/Results: CT scan  12/26/11 IMPRESSION: 1. Hepatic cirrhosis and hepatic steatosis. Possible gastric varices. Moderate ascites. 2. Distended gallbladder without CT evidence of cholelithiasis or acute cholecystitis. No biliary ductal dilation. 3. Atrophy involving the body of the pancreas. Pancreas otherwise normal. 4. Atrophic kidneys bilaterally consistent with the  history of end- stage renal disease. 5. 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 6. Bilateral pleural effusions, right greater than left, and pneumonia in the visualized lower lobes. 7. Scattered mildly enlarged retroperitoneal lymph nodes, statistically reactive nodes. 8. Large stool burden.   ASSESMENT: 1. Chronic GI bleeding, episodic. Current episode no different than usual per his report. Hx of internal hemorrhoids may explain this, possibly lower level diverticular bleed.  He is overdue for Colonoscopy to follow up his of adenomatous polyps in 01/2010.  2. Abdominal and back pain, new onset.  CT scan unrevealing as to cause.  3.  Pneumonia, "community" acquired.  Finding on CT scan as he had no acute respiratory symptoms.  On Avelox.   4. Anemia, normocytic. Allergy to IV Iron which precipitated porphyria outbreak. Epo dose recently increased.  H & H relatively stable following blood transfusion last week. 5. ESRD  6. Hepatitis C positive, cirrhosis, ascites, possible gastric varices by CT scan.  Coags and transaminases normal.  EGD in 2011 with no stigmata of liver disease. 7. Hx GERD, s/p remote Nissen fundoplication. H Pylori positive 2011 not clear if it was ever treated.  8.  Hypertension.    PLAN: 1.  See discharge sheet.  Has Colon/EGD on March 27th at [place].   LOS: 4 days   Jennye Moccasin  12/28/2011, 8:33 AM Pager: [phone]

## 2011-12-28 NOTE — Discharge Instructions (Signed)

## 2011-12-29 ENCOUNTER — Encounter: Payer: Self-pay | Admitting: Physician Assistant

## 2011-12-29 ENCOUNTER — Telehealth: Payer: Self-pay | Admitting: Gastroenterology

## 2011-12-29 MED FILL — Hydromorphone HCl Preservative Free (PF) Inj 10 MG/ML: INTRAMUSCULAR | Qty: 1 | Status: AC

## 2011-12-29 NOTE — Telephone Encounter (Signed)
Message copied by Mardella Layman on Tue Dec 29, 2011  5:27 PM ------      Message from: Dianah Field      Created: Tue Dec 29, 2011 11:08 AM      Regarding: plans for outpt colon for Mr.Moultrie       Hi Dr Jarold Motto,             Pt was seen as inpt last week, he has had chronic, episodic rectal bleeding.  Admitted last week with lower abd pain and low back pain.  No evidence of bowel ischemia on a CT scan.  As he is overdue for colonoscopy, Dr Leone Payor and Arlyce Dice agreed pt should undergo colonoscopy and I arranged direct case for March 27th, with prior preop nurse visit.  Just wanted to alert you.  You can review my 2 "consult" notes for this problem.  Thanks, have a good day, Maralyn Sago

## 2011-12-30 NOTE — Telephone Encounter (Signed)
This patient is too sick to be done in the [facility] DD. Please reschedule him to be done in the hospital with Dr. Arlyce Dice who has previously done his procedure

## 2011-12-30 NOTE — Telephone Encounter (Signed)
Sean [name] and Rob:  Please see this correspondence with Dr Jarold Motto.  I went ahead and cancelled RN visit and GCDD procedures.  I think we should consider [name] Dr Marzetta Board pt in the future, but please clear this with Dr Jarold Motto.      Thanks, Maralyn Sago

## 2011-12-31 NOTE — Telephone Encounter (Signed)
He needs hosp. Doc and procedures.Marland KitchenMarland Kitchen

## 2011-12-31 NOTE — Telephone Encounter (Signed)
yes

## 2011-12-31 NOTE — Telephone Encounter (Signed)
Dr. Arlyce Dice I think you have done procedures on this pt in the hospital for Dr. Jarold Motto in the past. Pt is due for endo/colon. Do you just want me to schedule him for a lunchtime procedure at the hospital? Please advise.

## 2012-01-01 NOTE — Telephone Encounter (Signed)
Left message for pt to call back  °

## 2012-01-04 NOTE — Telephone Encounter (Signed)
Left message for pt to call back  °

## 2012-01-05 ENCOUNTER — Other Ambulatory Visit: Payer: Self-pay | Admitting: Gastroenterology

## 2012-01-05 MED ORDER — PEG-KCL-NACL-NASULF-NA ASC-C 100 G PO SOLR: 1.0000 | Freq: Once | ORAL | Status: DC

## 2012-01-05 NOTE — Telephone Encounter (Signed)
Pt scheduled for endo/colon with Dr. Arlyce Dice at [facility] 01/20/12 arrival time 11:30am for a 12:30pm appt. Prep instructions mailed to patient. Rx for moviprep sent to pharmacy. Pt aware of appt dates and times.

## 2012-01-15 ENCOUNTER — Encounter: Payer: Self-pay | Admitting: Physician Assistant

## 2012-01-19 ENCOUNTER — Encounter: Payer: Self-pay | Admitting: Physician Assistant

## 2012-01-20 ENCOUNTER — Encounter (HOSPITAL_COMMUNITY): Admission: RE | Payer: Self-pay | Source: Ambulatory Visit

## 2012-01-20 ENCOUNTER — Telehealth: Payer: Self-pay

## 2012-01-20 ENCOUNTER — Ambulatory Visit (HOSPITAL_COMMUNITY): Admission: RE | Admit: 2012-01-20 | Payer: Medicare Other | Source: Ambulatory Visit | Admitting: Gastroenterology

## 2012-01-20 SURGERY — EGD (ESOPHAGOGASTRODUODENOSCOPY)
Anesthesia: Moderate Sedation

## 2012-01-20 NOTE — Telephone Encounter (Signed)
Pt was no show at hospital today for her EGD with Dr. Arlyce Dice. Do you want the pt rescheduled Dr. Jarold Motto? Please advise.

## 2012-01-20 NOTE — Telephone Encounter (Signed)
Notify sarah,pa,,,not a candidate for GCDD procedures

## 2012-01-22 ENCOUNTER — Other Ambulatory Visit: Payer: Self-pay | Admitting: Gastroenterology

## 2012-01-22 ENCOUNTER — Telehealth: Payer: Self-pay | Admitting: Gastroenterology

## 2012-01-22 ENCOUNTER — Telehealth: Payer: Self-pay | Admitting: Pulmonary Disease

## 2012-01-22 MED ORDER — HYDROCODONE-ACETAMINOPHEN 10-325 MG PO TABS: 1.0000 | ORAL_TABLET | Freq: Three times a day (TID) | ORAL | Status: AC | PRN

## 2012-01-22 NOTE — Telephone Encounter (Signed)
Spoke with pt's daughter. She states that the pt wanted her to call in reference to his pain- having increased back pain and hand pain. She states that his hands are literally "breaking open" and are painful. Has been treated at the wound clinic in the recent past with no relief. She states that she is unsure name of current pain med, but "it does not work anymore". He has morphine 15 mg bid on med list but she is unsure if this is it. She wants recs on what to do for back pain and hands. No openings in the schedule today and she does not want this to wait until next wk. Pt was last seen 08/04/11 and no appt pending. Please advise, thanks!

## 2012-01-22 NOTE — Telephone Encounter (Signed)
Pt states that he did not have a ride for his procedure the other day. Requesting it be rescheduled. Pt scheduled for ECL with Dr. Arlyce Dice at [facility] 02/22/12 arrival time 8am for a 9am appt. Prep instructions mailed to pt.

## 2012-01-22 NOTE — Telephone Encounter (Signed)
Called and spoke with pt and he is aware that per SN---we are going to send pt to see [facility]---pt stated that he has already seen them and the wound clinic there and they gave him cream to put on his hands and told him to keep them wrapped up.  Pt stated that they told him to follow back up with his primary care doctor for pain meds.  Pt stated that Dr. Izola Price d/c him from the hospital and gave him the pain meds---ms contin and dilaudid and the pt stated that he could not reach this doctor for refills.  He is aware that SN cannot fill these meds and he is requesting that the norco be filled for pain.  Pt is aware that this med has been called to the pharmacy.

## 2012-01-27 ENCOUNTER — Encounter: Payer: Medicare Other | Admitting: Gastroenterology

## 2012-02-02 ENCOUNTER — Observation Stay (HOSPITAL_COMMUNITY)
Admission: EM | Admit: 2012-02-02 | Discharge: 2012-02-02 | Disposition: A | Discharge status: Home / Self Care | Payer: Medicare Other | Source: Other Acute Inpatient Hospital | Attending: Nephrology | Admitting: Nephrology

## 2012-02-02 ENCOUNTER — Encounter (HOSPITAL_COMMUNITY): Payer: Self-pay | Admitting: *Deleted

## 2012-02-02 ENCOUNTER — Inpatient Hospital Stay (HOSPITAL_COMMUNITY): Payer: Medicare Other

## 2012-02-02 DIAGNOSIS — J810 Acute pulmonary edema: Principal | ICD-10-CM | POA: Insufficient documentation

## 2012-02-02 DIAGNOSIS — N2581 Secondary hyperparathyroidism of renal origin: Secondary | ICD-10-CM | POA: Insufficient documentation

## 2012-02-02 DIAGNOSIS — Z992 Dependence on renal dialysis: Secondary | ICD-10-CM | POA: Insufficient documentation

## 2012-02-02 DIAGNOSIS — D631 Anemia in chronic kidney disease: Secondary | ICD-10-CM | POA: Insufficient documentation

## 2012-02-02 DIAGNOSIS — R0602 Shortness of breath: Secondary | ICD-10-CM | POA: Insufficient documentation

## 2012-02-02 DIAGNOSIS — N039 Chronic nephritic syndrome with unspecified morphologic changes: Secondary | ICD-10-CM | POA: Insufficient documentation

## 2012-02-02 DIAGNOSIS — G8929 Other chronic pain: Secondary | ICD-10-CM | POA: Insufficient documentation

## 2012-02-02 DIAGNOSIS — F17200 Nicotine dependence, unspecified, uncomplicated: Secondary | ICD-10-CM | POA: Insufficient documentation

## 2012-02-02 DIAGNOSIS — I120 Hypertensive chronic kidney disease with stage 5 chronic kidney disease or end stage renal disease: Secondary | ICD-10-CM | POA: Insufficient documentation

## 2012-02-02 DIAGNOSIS — N186 End stage renal disease: Secondary | ICD-10-CM | POA: Insufficient documentation

## 2012-02-02 MED ORDER — CARBAMAZEPINE ER 200 MG PO TB12
300.0000 mg | ORAL_TABLET | Freq: Two times a day (BID) | ORAL | Status: DC
  Administered 2012-02-02: 300 mg via ORAL
  Filled 2012-02-02 (×2): qty 1

## 2012-02-02 MED ORDER — DIAZEPAM 5 MG PO TABS: 10.0000 mg | ORAL_TABLET | Freq: Three times a day (TID) | ORAL | Status: DC | PRN

## 2012-02-02 MED ORDER — HYDROXYZINE HCL 25 MG PO TABS: 25.0000 mg | ORAL_TABLET | Freq: Three times a day (TID) | ORAL | Status: DC | PRN

## 2012-02-02 MED ORDER — CARBAMAZEPINE ER 300 MG PO CP12: 300.0000 mg | ORAL_CAPSULE | Freq: Two times a day (BID) | ORAL | Status: DC

## 2012-02-02 MED ORDER — SODIUM CHLORIDE 0.9 % IV SOLN: 250.0000 mL | INTRAVENOUS | Status: DC | PRN

## 2012-02-02 MED ORDER — CALCIUM CARBONATE 1250 MG/5ML PO SUSP: 500.0000 mg | Freq: Four times a day (QID) | ORAL | Status: DC | PRN

## 2012-02-02 MED ORDER — ACETAMINOPHEN 325 MG PO TABS: 650.0000 mg | ORAL_TABLET | Freq: Four times a day (QID) | ORAL | Status: DC | PRN

## 2012-02-02 MED ORDER — OXYCODONE-ACETAMINOPHEN 5-325 MG PO TABS
1.0000 | ORAL_TABLET | Freq: Four times a day (QID) | ORAL | Status: DC | PRN
  Administered 2012-02-02 (×2): 1 via ORAL
  Filled 2012-02-02: qty 1

## 2012-02-02 MED ORDER — HEPARIN SODIUM (PORCINE) 1000 UNIT/ML DIALYSIS
40.0000 [IU]/kg | INTRAMUSCULAR | Status: DC | PRN
  Administered 2012-02-02: 2500 [IU] via INTRAVENOUS_CENTRAL
  Filled 2012-02-02: qty 3

## 2012-02-02 MED ORDER — ALTEPLASE 2 MG IJ SOLR
2.0000 mg | Freq: Once | INTRAMUSCULAR | Status: DC | PRN
  Filled 2012-02-02: qty 2

## 2012-02-02 MED ORDER — ZOLPIDEM TARTRATE 5 MG PO TABS: 5.0000 mg | ORAL_TABLET | Freq: Every evening | ORAL | Status: DC | PRN

## 2012-02-02 MED ORDER — ACETAMINOPHEN 650 MG RE SUPP: 650.0000 mg | Freq: Four times a day (QID) | RECTAL | Status: DC | PRN

## 2012-02-02 MED ORDER — HYDROMORPHONE BOLUS VIA INFUSION
1.0000 mg | Freq: Four times a day (QID) | INTRAVENOUS | Status: DC | PRN
  Filled 2012-02-02: qty 1

## 2012-02-02 MED ORDER — CAMPHOR-MENTHOL 0.5-0.5 % EX LOTN
1.0000 "application " | TOPICAL_LOTION | Freq: Three times a day (TID) | CUTANEOUS | Status: DC | PRN
  Filled 2012-02-02: qty 222

## 2012-02-02 MED ORDER — HEPARIN SODIUM (PORCINE) 1000 UNIT/ML DIALYSIS
1000.0000 [IU] | INTRAMUSCULAR | Status: DC | PRN
  Filled 2012-02-02: qty 1

## 2012-02-02 MED ORDER — SIMVASTATIN 20 MG PO TABS
20.0000 mg | ORAL_TABLET | Freq: Every day | ORAL | Status: DC
  Filled 2012-02-02: qty 1

## 2012-02-02 MED ORDER — AMLODIPINE BESYLATE 5 MG PO TABS
5.0000 mg | ORAL_TABLET | Freq: Every day | ORAL | Status: DC
  Administered 2012-02-02: 5 mg via ORAL
  Filled 2012-02-02: qty 1

## 2012-02-02 MED ORDER — DOCUSATE SODIUM 283 MG RE ENEM: 1.0000 | ENEMA | RECTAL | Status: DC | PRN

## 2012-02-02 MED ORDER — NEPRO/CARBSTEADY PO LIQD: 237.0000 mL | Freq: Three times a day (TID) | ORAL | Status: DC | PRN

## 2012-02-02 MED ORDER — SODIUM CHLORIDE 0.9 % IJ SOLN: 3.0000 mL | INTRAMUSCULAR | Status: DC | PRN

## 2012-02-02 MED ORDER — ONDANSETRON HCL 4 MG PO TABS: 4.0000 mg | ORAL_TABLET | Freq: Four times a day (QID) | ORAL | Status: DC | PRN

## 2012-02-02 MED ORDER — ESCITALOPRAM OXALATE 20 MG PO TABS
20.0000 mg | ORAL_TABLET | Freq: Every day | ORAL | Status: DC
  Administered 2012-02-02: 20 mg via ORAL
  Filled 2012-02-02: qty 1

## 2012-02-02 MED ORDER — PENTAFLUOROPROP-TETRAFLUOROETH EX AERO: 1.0000 "application " | INHALATION_SPRAY | CUTANEOUS | Status: DC | PRN

## 2012-02-02 MED ORDER — SODIUM CHLORIDE 0.9 % IV SOLN: 100.0000 mL | INTRAVENOUS | Status: DC | PRN

## 2012-02-02 MED ORDER — SODIUM CHLORIDE 0.9 % IJ SOLN: 3.0000 mL | Freq: Two times a day (BID) | INTRAMUSCULAR | Status: DC

## 2012-02-02 MED ORDER — LIDOCAINE HCL (PF) 1 % IJ SOLN: 5.0000 mL | INTRAMUSCULAR | Status: DC | PRN

## 2012-02-02 MED ORDER — SORBITOL 70 % SOLN: 30.0000 mL | Status: DC | PRN

## 2012-02-02 MED ORDER — LIDOCAINE-PRILOCAINE 2.5-2.5 % EX CREA: 1.0000 "application " | TOPICAL_CREAM | CUTANEOUS | Status: DC | PRN

## 2012-02-02 MED ORDER — OXYCODONE-ACETAMINOPHEN 5-325 MG PO TABS
ORAL_TABLET | ORAL | Status: AC
  Filled 2012-02-02: qty 1

## 2012-02-02 MED ORDER — HYDROXYZINE HCL 25 MG PO TABS: 25.0000 mg | ORAL_TABLET | Freq: Four times a day (QID) | ORAL | Status: DC | PRN

## 2012-02-02 MED ORDER — HYDROMORPHONE HCL PF 1 MG/ML IJ SOLN
1.0000 mg | INTRAMUSCULAR | Status: DC | PRN
  Administered 2012-02-02: 1 mg via INTRAVENOUS
  Filled 2012-02-02: qty 1

## 2012-02-02 MED ORDER — CALCIUM ACETATE 667 MG PO CAPS
2001.0000 mg | ORAL_CAPSULE | Freq: Three times a day (TID) | ORAL | Status: DC
  Administered 2012-02-02: 2001 mg via ORAL
  Filled 2012-02-02 (×4): qty 3

## 2012-02-02 MED ORDER — PANTOPRAZOLE SODIUM 40 MG PO TBEC: 40.0000 mg | DELAYED_RELEASE_TABLET | Freq: Every day | ORAL | Status: DC

## 2012-02-02 MED ORDER — ONDANSETRON HCL 4 MG/2ML IJ SOLN: 4.0000 mg | Freq: Four times a day (QID) | INTRAMUSCULAR | Status: DC | PRN

## 2012-02-02 NOTE — Progress Notes (Signed)
O2 sats 96-98% on room air while ambulating.  Net UF 6 liters! Post wt 57.8 kg. EDW 53.5.  He needs to follow up with usual HD appt on Thursday, run his full treatment and watch fluids.  Ok for discharge. This has been discussed with patient.  Bard Herbert, PA-C 01/02/2012 10: 27 am

## 2012-02-02 NOTE — Discharge Summary (Signed)
Physician Discharge Summary  Patient ID: Sean [name] MRN: [id number] DOB/AGE: November 12, 1960 51 y.o.  Admit date: 02/02/2012 Discharge date: 02/02/2012  Discharge Diagnoses:   1. Acute shortness of breath secondary to acute noncardiogenic pulmonary edema  2. ESRD on chronic hemodialysis  3. Hypertension  4. Chronic pain secondary to PCT/chronic ulcerations  5. Secondary hyperparathyroidism  6. 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  At the [facility] ER, was found to have hypoxemia with asymmetric bilateral pulmonary edema that somewhat responded to oxygen per nasal cannula. Preceding the need for urgent/emergent dialysis, he was transferred to [facility] for further management. Prior history is significant for admissions for volume overload necessitating intubation/NIPPV and the need for urgent intervention was perceived to circumvent the same. He had been truncating his HD treatments after about 2-3 hours and attributes this admission to drinking a lot of water because the trazodone prescribed to him to help him sleep made him very thirsty.  He was acutely dialyzed with a net UF of 6 liters and post wt of 57.8 kg, with a usual EDW of 53 .5 kg.  BP is controlled.  O2 sats were 96-98% while he was ambulating on room air.  He was cleared for discharge and encouraged to stay on for his full hemodialysis treatments. There is no change to his dialysis prescription or medications.  Vital signs prior to discharge were: Blood pressure 138/73, pulse 78, temperature 98.5 F (36.9 C), temperature source Oral, resp. rate 20, weight 57.8 kg (127 lb 6.8 oz), SpO2 96.00%.  Disposition: 01-Home or Self Care  Discharge Orders    Future Orders Please Complete By Expires   Renal diet -  Limit fluids to 1200 ml/day      Bring all medications to your doctor's appointment      Do not skip any hemodialysis appointments unless directed by your doctor      STOP ANY ACTIVITY THAT CAUSES CHEST PAIN, SHORTNESS OF BREATH, DIZZINESS, SWEATING OR EXCESSIVE WEAKNESS      Call doctor for shortness of breath, with or without a dry hacking cough      Call doctor for swellling in the hands, feet or stomach not improved after hemodialysis      Activity as tolerated        Medication List  As of 02/02/2012 10:40 AM        TAKE these medications         albuterol 108 (90 BASE) MCG/ACT inhaler   Commonly known as: PROVENTIL HFA;VENTOLIN HFA   Inhale 2 puffs into the lungs every 6 (six) hours as needed. For shortness of breath      amLODipine 5 MG tablet   Commonly known as: NORVASC   Take 5 mg by mouth daily.      Calcium Acetate 667 MG Tabs   Take 1 tablet by mouth 3 (three) times daily.      calcium carbonate 500 MG chewable tablet   Commonly known as: TUMS - dosed in mg elemental calcium   Chew 2 tablets by mouth 2 (two) times daily.      carbamazepine 300 MG 12 hr capsule   Commonly known as: CARBATROL   Take 300 mg by mouth 2 (two) times daily.      diazepam 10 MG tablet  Commonly known as: VALIUM   Take 10 mg by mouth 3 (three) times daily as needed. For nerves      escitalopram 20 MG tablet   Commonly known as: LEXAPRO   Take 20 mg by mouth daily.      hydrOXYzine 25 MG tablet   Commonly known as: ATARAX/VISTARIL   Take 25 mg by mouth every 6 (six) hours as needed. For itching      metoprolol 50 MG tablet   Commonly known as: LOPRESSOR   Take 50 mg by mouth 2 (two) times daily.      pantoprazole 40 MG tablet   Commonly known as: PROTONIX   Take 40 mg by mouth daily.      simvastatin 20 MG tablet   Commonly known as: ZOCOR   Take 20 mg by mouth at bedtime.             Signed: Sheffield Slider, PA-C [facility] Beeper  [phone] 02/02/2012, 10:40 AM  Attending Physician:
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
## 2012-02-02 NOTE — Progress Notes (Signed)
AVS reviewed with pt and family member at bedside. Pt able to verbalize and understand AVS instructions. IV Dc'd. Pt escorted ambulatory to short stay. [place], Rosanna Randy

## 2012-02-02 NOTE — Progress Notes (Signed)
Pt oxygen saturation on room air is 96% sitting. Pt able to stand at the sink and brush his teeth and ambulate in hallway on room air with no difficulty breathing or complaints of SOB. Oxygen saturations remain at 96%. Sean [name], Rosanna Randy

## 2012-02-03 ENCOUNTER — Telehealth: Payer: Self-pay | Admitting: Pulmonary Disease

## 2012-02-03 NOTE — Telephone Encounter (Signed)
Spoke with pt and notified of recs per SN. Pt verbalized understanding. I advised that if anything else is said about wanting [place] change meds, he should have them call us and explain why. Pt verbalized understanding and states nothing further needed.

## 2012-02-03 NOTE — Telephone Encounter (Signed)
Per SN---we have been filling the norco for the pt b/c this med is allowed to have refills.  The percocet is not allowed to be refilled and pt will have to have an appt every month with SN to get refills of this med.  SN recs to stay on the norco for now.  We have not received anything from the dialysis clinic about changing the pts meds.  thanks

## 2012-02-03 NOTE — Telephone Encounter (Signed)
I spoke with pt and he states the dialysis clinic is wanting SN to change his pain medication for his hand pain. He wasn;t exactly sure why. He states he is on percocet 5-325 TID. He states SN gave him this but i do not see this in his chart but shows in his chart where he has been prescribed norco. Please advise SN, thanks  Allergies  Allergen Reactions  . Iron     Causes pt to bruise easily

## 2012-02-11 ENCOUNTER — Telehealth: Payer: Self-pay | Admitting: Pulmonary Disease

## 2012-02-11 NOTE — Telephone Encounter (Signed)
Spoke with pt. He is c/o HA for the past 2 days. He states that the pain is bothering him so much that he has lost his appetite. Also he c/o hand pain from his porphyria cutanea tarda and states that the norco is no longer helping with this pain, nor pain from his HA. Will forward to doc of the day since SN is out until next wk. Please advise, thanks! Allergies  Allergen Reactions  . Iron     Causes pt to bruise easily

## 2012-02-11 NOTE — Telephone Encounter (Signed)
Offer tramadol 50 mg, 1-2 every 6 hours for pain as needed, # 30. Next week call Dr Kriste Basque for guidance.

## 2012-02-11 NOTE — Telephone Encounter (Signed)
Attempted to call pt but no answer and unable to leave a message. Will try back in the morning.

## 2012-02-12 MED ORDER — TRAMADOL HCL 50 MG PO TABS: ORAL_TABLET | ORAL | Status: DC

## 2012-02-12 NOTE — Telephone Encounter (Signed)
ATC, NA and no option to leave msg WCB

## 2012-02-12 NOTE — Telephone Encounter (Signed)
Pt returned the call to triage--he is aware that the tramadol 50mg   #30  1-2 every 6 hours prn pain has been sent in to the pharmacy per CY.  Pt is aware to call back on Monday and check with SN for further recs.  Nothing further needed.

## 2012-02-15 ENCOUNTER — Other Ambulatory Visit: Payer: Self-pay | Admitting: Vascular Surgery

## 2012-02-15 ENCOUNTER — Telehealth: Payer: Self-pay | Admitting: Pulmonary Disease

## 2012-02-15 MED ORDER — OXYCODONE-ACETAMINOPHEN 10-325 MG PO TABS: 1.0000 | ORAL_TABLET | Freq: Three times a day (TID) | ORAL | Status: AC | PRN

## 2012-02-15 NOTE — Telephone Encounter (Signed)
I spoke with Sean [name] and advised rx at front. Pt cannot come on tuesdays, thursdays due to dialysis. Please advise on another appt date and time. Thanks.[name], CMA

## 2012-02-15 NOTE — Telephone Encounter (Signed)
Note: caller says tramadol "doesn't touch this at all". (see last masg re: rx request last fri for more info).

## 2012-02-15 NOTE — Telephone Encounter (Signed)
I spoke with Sean [name], pt friend and she states the tramadol is not helping the pt at all and is requesting something else for pain such as percocet. I advised per phone note on 02-03-12 the following: Sean [name], [facility] 02/03/2012 2:30 PM Signed  Per SN---we have been filling the norco for the pt b/c this med is allowed to have refills. The percocet is not allowed to be refilled and pt will have to have an appt every month with SN to get refills of this med. SN recs to stay on the norco for now. We have not received anything from the dialysis clinic about changing the pts meds. Thanks  She states she just does not think the pt can sit through dialysis tomorrow with this pain and wants to know what to do? Also she wants to set an appt for the pt to discuss pain meds but does not want to wait until first available. Please advise if sooner appt for the pt. Thanks. [name], CMA

## 2012-02-15 NOTE — Telephone Encounter (Signed)
Ok can use may 8 at 30.  Thanks

## 2012-02-15 NOTE — Telephone Encounter (Signed)
rx has been printed out for the percocet and pt will need appt to see SN--can use 5-7 at 12.  i will leave rx up front for the pt to come by and pick this up.  thanks

## 2012-02-15 NOTE — Telephone Encounter (Signed)
Pt set for 5-8-at 12. [name], CMA

## 2012-02-16 NOTE — Discharge Summary (Signed)
I have seen and examined this patient and agree with the assessment/plan as outlined above by [facility] PA in her discharge summary. Phillippa Straub K.,MD 02/16/2012 10:33 AM

## 2012-02-22 ENCOUNTER — Ambulatory Visit (HOSPITAL_COMMUNITY)
Admission: RE | Admit: 2012-02-22 | Discharge: 2012-02-22 | Disposition: A | Discharge status: Home / Self Care | Payer: Medicare Other | Source: Ambulatory Visit | Attending: Gastroenterology | Admitting: Gastroenterology

## 2012-02-22 ENCOUNTER — Encounter (HOSPITAL_COMMUNITY)
Admission: RE | Disposition: A | Discharge status: Home / Self Care | Payer: Self-pay | Source: Ambulatory Visit | Attending: Gastroenterology

## 2012-02-22 ENCOUNTER — Encounter (HOSPITAL_COMMUNITY): Payer: Self-pay

## 2012-02-22 DIAGNOSIS — J449 Chronic obstructive pulmonary disease, unspecified: Secondary | ICD-10-CM | POA: Insufficient documentation

## 2012-02-22 DIAGNOSIS — E039 Hypothyroidism, unspecified: Secondary | ICD-10-CM | POA: Insufficient documentation

## 2012-02-22 DIAGNOSIS — K2990 Gastroduodenitis, unspecified, without bleeding: Secondary | ICD-10-CM

## 2012-02-22 DIAGNOSIS — I120 Hypertensive chronic kidney disease with stage 5 chronic kidney disease or end stage renal disease: Secondary | ICD-10-CM | POA: Insufficient documentation

## 2012-02-22 DIAGNOSIS — N186 End stage renal disease: Secondary | ICD-10-CM | POA: Insufficient documentation

## 2012-02-22 DIAGNOSIS — K2970 Gastritis, unspecified, without bleeding: Secondary | ICD-10-CM

## 2012-02-22 DIAGNOSIS — E785 Hyperlipidemia, unspecified: Secondary | ICD-10-CM | POA: Insufficient documentation

## 2012-02-22 DIAGNOSIS — K648 Other hemorrhoids: Secondary | ICD-10-CM | POA: Insufficient documentation

## 2012-02-22 DIAGNOSIS — Z992 Dependence on renal dialysis: Secondary | ICD-10-CM | POA: Insufficient documentation

## 2012-02-22 DIAGNOSIS — K7460 Unspecified cirrhosis of liver: Secondary | ICD-10-CM | POA: Insufficient documentation

## 2012-02-22 DIAGNOSIS — B1920 Unspecified viral hepatitis C without hepatic coma: Secondary | ICD-10-CM | POA: Insufficient documentation

## 2012-02-22 DIAGNOSIS — G40909 Epilepsy, unspecified, not intractable, without status epilepticus: Secondary | ICD-10-CM | POA: Insufficient documentation

## 2012-02-22 DIAGNOSIS — K625 Hemorrhage of anus and rectum: Secondary | ICD-10-CM

## 2012-02-22 DIAGNOSIS — F17200 Nicotine dependence, unspecified, uncomplicated: Secondary | ICD-10-CM | POA: Insufficient documentation

## 2012-02-22 DIAGNOSIS — K921 Melena: Secondary | ICD-10-CM | POA: Insufficient documentation

## 2012-02-22 DIAGNOSIS — K219 Gastro-esophageal reflux disease without esophagitis: Secondary | ICD-10-CM | POA: Insufficient documentation

## 2012-02-22 DIAGNOSIS — J4489 Other specified chronic obstructive pulmonary disease: Secondary | ICD-10-CM | POA: Insufficient documentation

## 2012-02-22 HISTORY — PX: COLONOSCOPY: SHX5424

## 2012-02-22 HISTORY — PX: ESOPHAGOGASTRODUODENOSCOPY: SHX5428

## 2012-02-22 SURGERY — COLONOSCOPY
Anesthesia: Moderate Sedation

## 2012-02-22 MED ORDER — MIDAZOLAM HCL 10 MG/2ML IJ SOLN
INTRAMUSCULAR | Status: AC
  Filled 2012-02-22: qty 4

## 2012-02-22 MED ORDER — MIDAZOLAM HCL 10 MG/2ML IJ SOLN
INTRAMUSCULAR | Status: DC | PRN
  Administered 2012-02-22 (×7): 2.5 mg via INTRAVENOUS

## 2012-02-22 MED ORDER — FENTANYL CITRATE 0.05 MG/ML IJ SOLN
INTRAMUSCULAR | Status: AC
  Filled 2012-02-22: qty 4

## 2012-02-22 MED ORDER — BUTAMBEN-TETRACAINE-BENZOCAINE 2-2-14 % EX AERO
INHALATION_SPRAY | CUTANEOUS | Status: DC | PRN
  Administered 2012-02-22: 2 via TOPICAL

## 2012-02-22 MED ORDER — GLYCOPYRROLATE 0.2 MG/ML IJ SOLN
INTRAMUSCULAR | Status: DC | PRN
  Administered 2012-02-22: 0.2 mg via INTRAVENOUS

## 2012-02-22 MED ORDER — MIDAZOLAM HCL 5 MG/5ML IJ SOLN
INTRAMUSCULAR | Status: DC | PRN
  Administered 2012-02-22: 2.5 mg via INTRAVENOUS

## 2012-02-22 MED ORDER — DIPHENHYDRAMINE HCL 50 MG/ML IJ SOLN
INTRAMUSCULAR | Status: AC
  Filled 2012-02-22: qty 1

## 2012-02-22 MED ORDER — GLUCAGON HCL (RDNA) 1 MG IJ SOLR
INTRAMUSCULAR | Status: AC
  Filled 2012-02-22: qty 1

## 2012-02-22 MED ORDER — DIPHENHYDRAMINE HCL 50 MG/ML IJ SOLN
INTRAMUSCULAR | Status: DC | PRN
  Administered 2012-02-22 (×2): 25 mg via INTRAVENOUS

## 2012-02-22 MED ORDER — HYDROCORTISONE ACETATE 25 MG RE SUPP: 25.0000 mg | Freq: Two times a day (BID) | RECTAL | Status: DC

## 2012-02-22 MED ORDER — SODIUM CHLORIDE 0.9 % IV SOLN
Freq: Once | INTRAVENOUS | Status: AC
  Administered 2012-02-22: 500 mL via INTRAVENOUS

## 2012-02-22 MED ORDER — GLYCOPYRROLATE 0.2 MG/ML IJ SOLN
INTRAMUSCULAR | Status: AC
  Filled 2012-02-22: qty 1

## 2012-02-22 MED ORDER — FENTANYL NICU IV SYRINGE 50 MCG/ML
INJECTION | INTRAMUSCULAR | Status: DC | PRN
  Administered 2012-02-22 (×7): 25 ug via INTRAVENOUS
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
## 2012-02-22 NOTE — Discharge Instructions (Addendum)
Endoscopy Care After Please read the instructions outlined below and refer to this sheet in the next few weeks. These discharge instructions provide you with general information on caring for yourself after you leave the hospital. Your doctor may also give you specific instructions. While your treatment has been planned according to the most current medical practices available, unavoidable complications occasionally occur. If you have any problems or questions after discharge, please call your doctor. HOME CARE INSTRUCTIONS Activity  You may resume your regular activity but move at a slower pace for the next 24 hours.   Take frequent rest periods for the next 24 hours.   Walking will help expel (get rid of) the air and reduce the bloated feeling in your abdomen.   No driving for 24 hours (because of the anesthesia (medicine) used during the test).   You may shower.   Do not sign any important legal documents or operate any machinery for 24 hours (because of the anesthesia used during the test).  Nutrition  Drink plenty of fluids.   You may resume your normal diet.   Begin with a light meal and progress to your normal diet.   Avoid alcoholic beverages for 24 hours or as instructed by your caregiver.  Medications You may resume your normal medications unless your caregiver tells you otherwise. What you can expect today  You may experience abdominal discomfort such as a feeling of fullness or "gas" pains.   You may experience a sore throat for 2 to 3 days. This is normal. Gargling with salt water may help this.  Follow-up Your doctor will discuss the results of your test with you. SEEK IMMEDIATE MEDICAL CARE IF:  You have excessive nausea (feeling sick to your stomach) and/or vomiting.   You have severe abdominal pain and distention (swelling).   You have trouble swallowing.   You have a temperature over 100 F (37.8 C).   You have rectal bleeding or vomiting of blood.    Document Released: 06/02/2004 Document Revised: 10/08/2011 Document Reviewed: 12/14/2007 Exit[employer] Patient Information 2012 [employer]  Colonoscopy Care After Read the instructions outlined below and refer to this sheet in the next few weeks. These discharge instructions provide you with general information on caring for yourself after you leave the hospital. Your doctor may also give you specific instructions. While your treatment has been planned according to the most current medical practices available, unavoidable complications occasionally occur. If you have any problems or questions after discharge, call your doctor. HOME CARE INSTRUCTIONS ACTIVITY:  You may resume your regular activity, but move at a slower pace for the next 24 hours.   Take frequent rest periods for the next 24 hours.   Walking will help get rid of the air and reduce the bloated feeling in your belly (abdomen).   No driving for 24 hours (because of the medicine (anesthesia) used during the test).   You may shower.   Do not sign any important legal documents or operate any machinery for 24 hours (because of the anesthesia used during the test).  NUTRITION:  Drink plenty of fluids.   You may resume your normal diet as instructed by your doctor.   Begin with a light meal and progress to your normal diet. Heavy or fried foods are harder to digest and may make you feel sick to your stomach (nauseated).   Avoid alcoholic beverages for 24 hours or as instructed.  MEDICATIONS:  You may resume your normal medications unless your   doctor tells you otherwise.  WHAT TO EXPECT TODAY:  Some feelings of bloating in the abdomen.   Passage of more gas than usual.   Spotting of blood in your stool or on the toilet paper.  IF YOU HAD POLYPS REMOVED DURING THE COLONOSCOPY:  No aspirin products for 7 days or as instructed.   No alcohol for 7 days or as instructed.   Eat a soft diet for the next 24 hours.   FINDING OUT THE RESULTS OF YOUR TEST Not all test results are available during your visit. If your test results are not back during the visit, make an appointment with your caregiver to find out the results. Do not assume everything is normal if you have not heard from your caregiver or the medical facility. It is important for you to follow up on all of your test results.  SEEK IMMEDIATE MEDICAL CARE IF:  You have more than a spotting of blood in your stool.   Your belly is swollen (abdominal distention).   You are nauseated or vomiting.   You have a fever.   You have abdominal pain or discomfort that is severe or gets worse throughout the day.  Document Released: 06/02/2004 Document Revised: 10/08/2011 Document Reviewed: 05/31/2008 [employer] Patient Information 2012 [employer]. 

## 2012-02-22 NOTE — Op Note (Signed)
[facility] [address] [place], [place]  [address]  COLONOSCOPY PROCEDURE REPORT  PATIENT:  Sean [name], Sean [name]  MR#:  [id number] BIRTHDATE:  [birth year], 50 yrs. old  GENDER:  male ENDOSCOPIST:  Barbette Hair. Arlyce Dice, MD REF. BY:  Alroy Dust, M.D. Marina Gravel, M.D. PROCEDURE DATE:  02/22/2012 PROCEDURE:  Diagnostic Colonoscopy ASA CLASS:  Class III INDICATIONS:  hematochezia MEDICATIONS:   These medications were titrated to patient response per physician's verbal order, Fentanyl 150 mcg IV, Versed 17.5 mg IV, glycopyrrolate (Robinal) 0.2 mg IV  DESCRIPTION OF PROCEDURE:   After the risks benefits and alternatives of the procedure were thoroughly explained, informed consent was obtained.  Digital rectal exam was performed and revealed no abnormalities.   The Pentax Colonoscope [id number] endoscope was introduced through the anus and advanced to the cecum, which was identified by the ileocecal valve, limited by poor preparation.  moderate amount of retained, liquid stool  The quality of the prep was Moviprep fair.  The instrument was then slowly withdrawn as the colon was fully examined. <<PROCEDUREIMAGES>>  FINDINGS:  Internal Hemorrhoids were found (see image5).  This was otherwise a normal examination of the colon (see image2 and image3).   Retroflexion was not performed.  The time to cecum = minutes. The scope was then withdrawn in  minutes from the cecum and the procedure completed. COMPLICATIONS:  None ENDOSCOPIC IMPRESSION: 1) Internal hemorrhoids 2) Otherwise normal examination  Limited rectal bleeding is most likely secondary to hemorrhoids  RECOMMENDATIONS:Anusol HC supp prn; t/c band ligation for persistent bleeding  REPEAT EXAM:  No  ______________________________ Barbette Hair. Arlyce Dice, MD  CC:  n. eSIGNED:   Barbette Hair. Jammie Troup at 02/22/2012 10:09 AM  [facility], [id number]

## 2012-02-22 NOTE — Op Note (Signed)
[facility] [address] [place], [place]  [address]  ENDOSCOPY PROCEDURE REPORT  PATIENT:  Sean [name], Sean [name]  MR#:  [id number] BIRTHDATE:  09-Jan-1961, 50 yrs. old  GENDER:  male  ENDOSCOPIST:  Barbette Hair. Arlyce Dice, MD Referred by:  Alroy Dust, M.D. Marina Gravel, M.D.  PROCEDURE DATE:  02/22/2012 PROCEDURE:  EGD, diagnostic 43235 ASA CLASS:  Class III INDICATIONS:  Evaluate for esophageal varices in a patient with portal hypertension and/or cirrhosis.  MEDICATIONS:   There was residual sedation effect present from prior procedure., Fentanyl 25 mcg IV, Versed 2.5 mg IV TOPICAL ANESTHETIC:  Cetacaine Spray  DESCRIPTION OF PROCEDURE:   After the risks and benefits of the procedure were explained, informed consent was obtained.  The Pentax Gastroscope [id number] endoscope was introduced through the mouth and advanced to the third portion of the duodenum.  The instrument was slowly withdrawn as the mucosa was fully examined. <<PROCEDUREIMAGES>>  Edema was found in the cardia (see image3). Mild mucosal edema in cardia and fundus  Otherwise the examination was normal (see image1, image2, image6, and image7). No varices seen Retroflexed views revealed no abnormalities.    The scope was then withdrawn from the patient and the procedure completed.  COMPLICATIONS:  None  ENDOSCOPIC IMPRESSION: 1) Gastritis - mild 2) Otherwise normal examination - no varices  RECOMMENDATIONS:EGD 1 year  ______________________________ Barbette Hair. Arlyce Dice, MD  CC:  n. eSIGNED:   Barbette Hair. Toby Ayad at 02/22/2012 10:12 AM  [facility], [phone]

## 2012-02-23 ENCOUNTER — Encounter (HOSPITAL_COMMUNITY): Payer: Self-pay | Admitting: Gastroenterology

## 2012-03-09 ENCOUNTER — Ambulatory Visit (INDEPENDENT_AMBULATORY_CARE_PROVIDER_SITE_OTHER): Payer: Medicare Other | Admitting: Pulmonary Disease

## 2012-03-09 ENCOUNTER — Encounter: Payer: Self-pay | Admitting: Pulmonary Disease

## 2012-03-09 VITALS — BP 140/70 | HR 63 | Temp 96.8°F | Ht 71.0 in | Wt 139.0 lb

## 2012-03-09 DIAGNOSIS — K219 Gastro-esophageal reflux disease without esophagitis: Secondary | ICD-10-CM

## 2012-03-09 DIAGNOSIS — F17200 Nicotine dependence, unspecified, uncomplicated: Secondary | ICD-10-CM

## 2012-03-09 DIAGNOSIS — R51 Headache: Secondary | ICD-10-CM

## 2012-03-09 DIAGNOSIS — E039 Hypothyroidism, unspecified: Secondary | ICD-10-CM

## 2012-03-09 DIAGNOSIS — F411 Generalized anxiety disorder: Secondary | ICD-10-CM

## 2012-03-09 DIAGNOSIS — F329 Major depressive disorder, single episode, unspecified: Secondary | ICD-10-CM

## 2012-03-09 DIAGNOSIS — B1710 Acute hepatitis C without hepatic coma: Secondary | ICD-10-CM

## 2012-03-09 DIAGNOSIS — K5730 Diverticulosis of large intestine without perforation or abscess without bleeding: Secondary | ICD-10-CM

## 2012-03-09 DIAGNOSIS — G894 Chronic pain syndrome: Secondary | ICD-10-CM

## 2012-03-09 DIAGNOSIS — R569 Unspecified convulsions: Secondary | ICD-10-CM

## 2012-03-09 DIAGNOSIS — N186 End stage renal disease: Secondary | ICD-10-CM

## 2012-03-09 DIAGNOSIS — D126 Benign neoplasm of colon, unspecified: Secondary | ICD-10-CM

## 2012-03-09 DIAGNOSIS — I10 Essential (primary) hypertension: Secondary | ICD-10-CM

## 2012-03-09 DIAGNOSIS — J209 Acute bronchitis, unspecified: Secondary | ICD-10-CM

## 2012-03-09 DIAGNOSIS — E7800 Pure hypercholesterolemia, unspecified: Secondary | ICD-10-CM

## 2012-03-09 MED ORDER — OXYCODONE-ACETAMINOPHEN 10-325 MG PO TABS: 1.0000 | ORAL_TABLET | Freq: Three times a day (TID) | ORAL | Status: AC | PRN

## 2012-03-09 MED ORDER — ALPRAZOLAM 1 MG PO TABS: 1.0000 mg | ORAL_TABLET | Freq: Three times a day (TID) | ORAL | Status: DC | PRN
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
## 2012-03-09 NOTE — Patient Instructions (Signed)
Today we updated your med list in our EPIC system...     We decided to change your Valium 10mg  to ALPRAZOLAM (Xanax) 1mg - one tab up to 3 times daily as needed for nerves...  We also wrote for your Pain med: PERCOCET 10/325- one tab up to 3 times daily as needed for pain...  As I explained this is the top dose & max number that I will write; do not exceed 3 per day; there are no early refills allowed...    If your pain is worse than this will effectively treat then that is the clue that we need a PAIN MANAGEMENT CLINIC to take over your pain medications- let me know before this becomes a major issue for you...  Call for any questions...  Let's plan a follow up visit in 6 month.Marland KitchenMarland Kitchen

## 2012-03-15 ENCOUNTER — Encounter: Payer: Self-pay | Admitting: Gastroenterology

## 2012-04-06 ENCOUNTER — Telehealth: Payer: Self-pay | Admitting: Pulmonary Disease

## 2012-04-06 MED ORDER — AMLODIPINE BESYLATE 5 MG PO TABS: 5.0000 mg | ORAL_TABLET | Freq: Every day | ORAL | Status: DC

## 2012-04-06 NOTE — Telephone Encounter (Signed)
Please advise if okay to give Rx for smoking patches or would he like patient to pick up OTC. Thanks.

## 2012-04-06 NOTE — Telephone Encounter (Signed)
I spoke with pt and is aware rx for norvasc was sent. Also he stated instead of the smoking patches he would like to try a "pill" to help him quit. He states he is really trying to quit but it is very hard and is requesting an rx to help him quit instead of the patches. Please advise SN thanks

## 2012-04-06 NOTE — Telephone Encounter (Signed)
Per SN---ok to refill the norvasc and patches are aval otc  But if the pts wants [place] to call these in we can.  thanks

## 2012-04-07 MED ORDER — VARENICLINE TARTRATE 0.5 MG X 11 & 1 MG X 42 PO MISC: ORAL | Status: DC

## 2012-04-07 MED ORDER — VARENICLINE TARTRATE 1 MG PO TABS: 1.0000 mg | ORAL_TABLET | Freq: Two times a day (BID) | ORAL | Status: DC

## 2012-04-07 NOTE — Telephone Encounter (Signed)
Rx for starting month and continuing month pack sent to pharm. Pt aware.

## 2012-04-07 NOTE — Telephone Encounter (Signed)
Per SN---chantix  Starter pack to take as directed and ok to refill with the continuing pack.  thanks

## 2012-04-14 ENCOUNTER — Telehealth: Payer: Self-pay | Admitting: Pulmonary Disease

## 2012-04-14 MED ORDER — OXYCODONE-ACETAMINOPHEN 10-325 MG PO TABS: 1.0000 | ORAL_TABLET | ORAL | Status: AC | PRN

## 2012-04-14 NOTE — Telephone Encounter (Signed)
Last rx given to the pt at his last ov with SN on 03/10/2012.  rx has been printed out and left up front for pt to pick up.  Called and spoke with pt and he is aware that the rx is up front.   Pt voiced his understanding

## 2012-04-19 ENCOUNTER — Ambulatory Visit: Payer: Medicare Other | Admitting: Gastroenterology

## 2012-04-22 ENCOUNTER — Ambulatory Visit: Payer: Medicare Other | Admitting: Gastroenterology

## 2012-05-04 ENCOUNTER — Telehealth: Payer: Self-pay | Admitting: Internal Medicine

## 2012-05-04 ENCOUNTER — Inpatient Hospital Stay (HOSPITAL_COMMUNITY)
Admission: EM | Admit: 2012-05-04 | Discharge: 2012-05-05 | DRG: 682 | Disposition: A | Discharge status: Home / Self Care | Payer: Medicare Other | Source: Other Acute Inpatient Hospital | Attending: Internal Medicine | Admitting: Internal Medicine

## 2012-05-04 ENCOUNTER — Other Ambulatory Visit: Payer: Self-pay | Admitting: Nephrology

## 2012-05-04 ENCOUNTER — Inpatient Hospital Stay (HOSPITAL_COMMUNITY): Payer: Medicare Other

## 2012-05-04 DIAGNOSIS — J9600 Acute respiratory failure, unspecified whether with hypoxia or hypercapnia: Secondary | ICD-10-CM | POA: Diagnosis present

## 2012-05-04 DIAGNOSIS — R634 Abnormal weight loss: Secondary | ICD-10-CM

## 2012-05-04 DIAGNOSIS — I10 Essential (primary) hypertension: Secondary | ICD-10-CM

## 2012-05-04 DIAGNOSIS — K047 Periapical abscess without sinus: Secondary | ICD-10-CM | POA: Diagnosis present

## 2012-05-04 DIAGNOSIS — Z79899 Other long term (current) drug therapy: Secondary | ICD-10-CM

## 2012-05-04 DIAGNOSIS — F3289 Other specified depressive episodes: Secondary | ICD-10-CM | POA: Diagnosis present

## 2012-05-04 DIAGNOSIS — K625 Hemorrhage of anus and rectum: Secondary | ICD-10-CM

## 2012-05-04 DIAGNOSIS — K219 Gastro-esophageal reflux disease without esophagitis: Secondary | ICD-10-CM | POA: Diagnosis present

## 2012-05-04 DIAGNOSIS — I120 Hypertensive chronic kidney disease with stage 5 chronic kidney disease or end stage renal disease: Principal | ICD-10-CM | POA: Diagnosis present

## 2012-05-04 DIAGNOSIS — B1920 Unspecified viral hepatitis C without hepatic coma: Secondary | ICD-10-CM | POA: Diagnosis present

## 2012-05-04 DIAGNOSIS — Z91158 Patient's noncompliance with renal dialysis for other reason: Secondary | ICD-10-CM

## 2012-05-04 DIAGNOSIS — R569 Unspecified convulsions: Secondary | ICD-10-CM | POA: Diagnosis present

## 2012-05-04 DIAGNOSIS — R0902 Hypoxemia: Secondary | ICD-10-CM | POA: Diagnosis present

## 2012-05-04 DIAGNOSIS — K922 Gastrointestinal hemorrhage, unspecified: Secondary | ICD-10-CM

## 2012-05-04 DIAGNOSIS — B1710 Acute hepatitis C without hepatic coma: Secondary | ICD-10-CM | POA: Diagnosis present

## 2012-05-04 DIAGNOSIS — D126 Benign neoplasm of colon, unspecified: Secondary | ICD-10-CM

## 2012-05-04 DIAGNOSIS — R1032 Left lower quadrant pain: Secondary | ICD-10-CM

## 2012-05-04 DIAGNOSIS — R197 Diarrhea, unspecified: Secondary | ICD-10-CM

## 2012-05-04 DIAGNOSIS — J329 Chronic sinusitis, unspecified: Secondary | ICD-10-CM

## 2012-05-04 DIAGNOSIS — Z9119 Patient's noncompliance with other medical treatment and regimen: Secondary | ICD-10-CM

## 2012-05-04 DIAGNOSIS — D62 Acute posthemorrhagic anemia: Secondary | ICD-10-CM

## 2012-05-04 DIAGNOSIS — K2990 Gastroduodenitis, unspecified, without bleeding: Secondary | ICD-10-CM

## 2012-05-04 DIAGNOSIS — N2581 Secondary hyperparathyroidism of renal origin: Secondary | ICD-10-CM | POA: Diagnosis present

## 2012-05-04 DIAGNOSIS — G40909 Epilepsy, unspecified, not intractable, without status epilepticus: Secondary | ICD-10-CM | POA: Diagnosis present

## 2012-05-04 DIAGNOSIS — Z9115 Patient's noncompliance with renal dialysis: Secondary | ICD-10-CM

## 2012-05-04 DIAGNOSIS — Z9189 Other specified personal risk factors, not elsewhere classified: Secondary | ICD-10-CM

## 2012-05-04 DIAGNOSIS — G894 Chronic pain syndrome: Secondary | ICD-10-CM | POA: Diagnosis present

## 2012-05-04 DIAGNOSIS — K029 Dental caries, unspecified: Secondary | ICD-10-CM | POA: Diagnosis present

## 2012-05-04 DIAGNOSIS — F17200 Nicotine dependence, unspecified, uncomplicated: Secondary | ICD-10-CM | POA: Diagnosis present

## 2012-05-04 DIAGNOSIS — J811 Chronic pulmonary edema: Secondary | ICD-10-CM | POA: Diagnosis present

## 2012-05-04 DIAGNOSIS — K648 Other hemorrhoids: Secondary | ICD-10-CM

## 2012-05-04 DIAGNOSIS — F329 Major depressive disorder, single episode, unspecified: Secondary | ICD-10-CM | POA: Diagnosis present

## 2012-05-04 DIAGNOSIS — Z992 Dependence on renal dialysis: Secondary | ICD-10-CM

## 2012-05-04 DIAGNOSIS — E7800 Pure hypercholesterolemia, unspecified: Secondary | ICD-10-CM | POA: Diagnosis present

## 2012-05-04 DIAGNOSIS — R51 Headache: Secondary | ICD-10-CM

## 2012-05-04 DIAGNOSIS — I472 Ventricular tachycardia: Secondary | ICD-10-CM

## 2012-05-04 DIAGNOSIS — N039 Chronic nephritic syndrome with unspecified morphologic changes: Secondary | ICD-10-CM | POA: Diagnosis present

## 2012-05-04 DIAGNOSIS — J209 Acute bronchitis, unspecified: Secondary | ICD-10-CM

## 2012-05-04 DIAGNOSIS — Z91199 Patient's noncompliance with other medical treatment and regimen due to unspecified reason: Secondary | ICD-10-CM

## 2012-05-04 DIAGNOSIS — F411 Generalized anxiety disorder: Secondary | ICD-10-CM | POA: Diagnosis present

## 2012-05-04 DIAGNOSIS — E039 Hypothyroidism, unspecified: Secondary | ICD-10-CM | POA: Diagnosis present

## 2012-05-04 DIAGNOSIS — J189 Pneumonia, unspecified organism: Secondary | ICD-10-CM

## 2012-05-04 DIAGNOSIS — D631 Anemia in chronic kidney disease: Secondary | ICD-10-CM | POA: Diagnosis present

## 2012-05-04 DIAGNOSIS — K5730 Diverticulosis of large intestine without perforation or abscess without bleeding: Secondary | ICD-10-CM

## 2012-05-04 DIAGNOSIS — N186 End stage renal disease: Secondary | ICD-10-CM | POA: Diagnosis present

## 2012-05-04 DIAGNOSIS — F419 Anxiety disorder, unspecified: Secondary | ICD-10-CM | POA: Diagnosis present

## 2012-05-04 LAB — CBC
HCT: 29.7 % — ABNORMAL LOW (ref 39.0–52.0)
Hemoglobin: 9.8 g/dL — ABNORMAL LOW (ref 13.0–17.0)
MCH: 26.8 pg (ref 26.0–34.0)
MCV: 81.1 fL (ref 78.0–100.0)
RBC: 3.66 MIL/uL — ABNORMAL LOW (ref 4.22–5.81)

## 2012-05-04 LAB — RENAL FUNCTION PANEL
BUN: 103 mg/dL — ABNORMAL HIGH (ref 6–23)
CO2: 20 mEq/L (ref 19–32)
Calcium: 9.5 mg/dL (ref 8.4–10.5)
Creatinine, Ser: 8.18 mg/dL — ABNORMAL HIGH (ref 0.50–1.35)
GFR calc Af Amer: 8 mL/min — ABNORMAL LOW (ref >90)
Glucose, Bld: 171 mg/dL — ABNORMAL HIGH (ref 70–99)

## 2012-05-04 LAB — CARDIAC PANEL(CRET KIN+CKTOT+MB+TROPI)
CK, MB: 1.6 ng/mL (ref 0.3–4.0)
Total CK: 13 U/L (ref 7–232)

## 2012-05-04 LAB — MRSA PCR SCREENING: MRSA by PCR: NEGATIVE

## 2012-05-04 MED ORDER — HYDROXYZINE HCL 25 MG PO TABS
25.0000 mg | ORAL_TABLET | Freq: Four times a day (QID) | ORAL | Status: DC | PRN
  Filled 2012-05-04: qty 1

## 2012-05-04 MED ORDER — CALCIUM ACETATE 667 MG PO TABS: 1.0000 | ORAL_TABLET | Freq: Three times a day (TID) | ORAL | Status: DC

## 2012-05-04 MED ORDER — POLYETHYLENE GLYCOL 3350 17 G PO PACK
17.0000 g | PACK | Freq: Every day | ORAL | Status: DC
  Administered 2012-05-04: 17 g via ORAL
  Filled 2012-05-04 (×3): qty 1

## 2012-05-04 MED ORDER — ACETAMINOPHEN 325 MG PO TABS
650.0000 mg | ORAL_TABLET | Freq: Four times a day (QID) | ORAL | Status: DC | PRN
  Administered 2012-05-04: 650 mg via ORAL

## 2012-05-04 MED ORDER — METOPROLOL TARTRATE 50 MG PO TABS
50.0000 mg | ORAL_TABLET | Freq: Two times a day (BID) | ORAL | Status: DC
  Administered 2012-05-04 – 2012-05-05 (×2): 50 mg via ORAL
  Filled 2012-05-04 (×3): qty 1

## 2012-05-04 MED ORDER — ACETAMINOPHEN 325 MG PO TABS
ORAL_TABLET | ORAL | Status: AC
  Administered 2012-05-04: 650 mg via ORAL
  Filled 2012-05-04: qty 2

## 2012-05-04 MED ORDER — ONDANSETRON HCL 4 MG PO TABS: 4.0000 mg | ORAL_TABLET | Freq: Four times a day (QID) | ORAL | Status: DC | PRN

## 2012-05-04 MED ORDER — HYDROXYZINE HCL 25 MG PO TABS
25.0000 mg | ORAL_TABLET | Freq: Three times a day (TID) | ORAL | Status: DC | PRN
  Filled 2012-05-04: qty 1

## 2012-05-04 MED ORDER — HEPARIN SODIUM (PORCINE) 1000 UNIT/ML DIALYSIS: 20.0000 [IU]/kg | INTRAMUSCULAR | Status: DC | PRN

## 2012-05-04 MED ORDER — ACETAMINOPHEN 650 MG RE SUPP: 650.0000 mg | Freq: Four times a day (QID) | RECTAL | Status: DC | PRN

## 2012-05-04 MED ORDER — ACETAMINOPHEN 325 MG PO TABS: 650.0000 mg | ORAL_TABLET | Freq: Four times a day (QID) | ORAL | Status: DC | PRN

## 2012-05-04 MED ORDER — SORBITOL 70 % SOLN
30.0000 mL | Status: DC | PRN
  Filled 2012-05-04 (×2): qty 30

## 2012-05-04 MED ORDER — ZOLPIDEM TARTRATE 5 MG PO TABS: 5.0000 mg | ORAL_TABLET | Freq: Every evening | ORAL | Status: DC | PRN

## 2012-05-04 MED ORDER — ALBUTEROL SULFATE HFA 108 (90 BASE) MCG/ACT IN AERS
2.0000 | INHALATION_SPRAY | Freq: Four times a day (QID) | RESPIRATORY_TRACT | Status: DC | PRN
  Filled 2012-05-04: qty 6.7

## 2012-05-04 MED ORDER — VARENICLINE TARTRATE 0.5 MG X 11 & 1 MG X 42 PO MISC: 1.0000 | Freq: Two times a day (BID) | ORAL | Status: DC

## 2012-05-04 MED ORDER — PARICALCITOL 5 MCG/ML IV SOLN
INTRAVENOUS | Status: AC
  Administered 2012-05-04: 1 ug via INTRAVENOUS
  Filled 2012-05-04: qty 1

## 2012-05-04 MED ORDER — LIDOCAINE-PRILOCAINE 2.5-2.5 % EX CREA: 1.0000 "application " | TOPICAL_CREAM | CUTANEOUS | Status: DC | PRN

## 2012-05-04 MED ORDER — HEPARIN SODIUM (PORCINE) 1000 UNIT/ML DIALYSIS: 1000.0000 [IU] | INTRAMUSCULAR | Status: DC | PRN

## 2012-05-04 MED ORDER — CALCIUM CARBONATE 1250 MG/5ML PO SUSP: 500.0000 mg | Freq: Four times a day (QID) | ORAL | Status: DC | PRN

## 2012-05-04 MED ORDER — CALCIUM CARBONATE 1250 MG/5ML PO SUSP
500.0000 mg | Freq: Four times a day (QID) | ORAL | Status: DC | PRN
  Filled 2012-05-04: qty 5

## 2012-05-04 MED ORDER — DOCUSATE SODIUM 283 MG RE ENEM
1.0000 | ENEMA | RECTAL | Status: DC | PRN
  Filled 2012-05-04: qty 1

## 2012-05-04 MED ORDER — BUTALBITAL-APAP-CAFFEINE 50-325-40 MG PO TABS
1.0000 | ORAL_TABLET | Freq: Four times a day (QID) | ORAL | Status: DC | PRN
Start: 2012-05-04 — End: 2012-05-05
  Administered 2012-05-04 – 2012-05-05 (×3): 1 via ORAL
  Filled 2012-05-04 (×4): qty 1

## 2012-05-04 MED ORDER — CAMPHOR-MENTHOL 0.5-0.5 % EX LOTN: 1.0000 "application " | TOPICAL_LOTION | Freq: Three times a day (TID) | CUTANEOUS | Status: DC | PRN

## 2012-05-04 MED ORDER — HYDROCORTISONE ACETATE 25 MG RE SUPP
25.0000 mg | Freq: Two times a day (BID) | RECTAL | Status: DC | PRN
  Filled 2012-05-04: qty 1

## 2012-05-04 MED ORDER — HYDROXYZINE HCL 10 MG PO TABS: 25.0000 mg | ORAL_TABLET | Freq: Three times a day (TID) | ORAL | Status: DC | PRN

## 2012-05-04 MED ORDER — ONDANSETRON HCL 4 MG/2ML IJ SOLN: 4.0000 mg | Freq: Four times a day (QID) | INTRAMUSCULAR | Status: DC | PRN

## 2012-05-04 MED ORDER — VARENICLINE TARTRATE 1 MG PO TABS: 1.0000 mg | ORAL_TABLET | Freq: Two times a day (BID) | ORAL | Status: DC

## 2012-05-04 MED ORDER — PENTAFLUOROPROP-TETRAFLUOROETH EX AERO: 1.0000 "application " | INHALATION_SPRAY | CUTANEOUS | Status: DC | PRN

## 2012-05-04 MED ORDER — PARICALCITOL 5 MCG/ML IV SOLN
1.0000 ug | INTRAVENOUS | Status: DC
  Administered 2012-05-04: 1 ug via INTRAVENOUS

## 2012-05-04 MED ORDER — HEPARIN SODIUM (PORCINE) 5000 UNIT/ML IJ SOLN
5000.0000 [IU] | Freq: Three times a day (TID) | INTRAMUSCULAR | Status: DC
Start: 2012-05-04 — End: 2012-05-04
  Filled 2012-05-04 (×2): qty 1

## 2012-05-04 MED ORDER — AMLODIPINE BESYLATE 5 MG PO TABS
5.0000 mg | ORAL_TABLET | Freq: Every day | ORAL | Status: DC
  Administered 2012-05-04 – 2012-05-05 (×2): 5 mg via ORAL
  Filled 2012-05-04 (×2): qty 1

## 2012-05-04 MED ORDER — ONDANSETRON HCL 4 MG/2ML IJ SOLN
4.0000 mg | Freq: Four times a day (QID) | INTRAMUSCULAR | Status: DC | PRN
  Administered 2012-05-04: 4 mg via INTRAVENOUS

## 2012-05-04 MED ORDER — NEPRO/CARBSTEADY PO LIQD: 237.0000 mL | ORAL | Status: DC | PRN

## 2012-05-04 MED ORDER — LIDOCAINE HCL (PF) 1 % IJ SOLN: 5.0000 mL | INTRAMUSCULAR | Status: DC | PRN

## 2012-05-04 MED ORDER — CAMPHOR-MENTHOL 0.5-0.5 % EX LOTN
1.0000 "application " | TOPICAL_LOTION | Freq: Three times a day (TID) | CUTANEOUS | Status: DC | PRN
  Filled 2012-05-04: qty 222

## 2012-05-04 MED ORDER — ALTEPLASE 2 MG IJ SOLR: 2.0000 mg | Freq: Once | INTRAMUSCULAR | Status: AC | PRN

## 2012-05-04 MED ORDER — SODIUM CHLORIDE 0.9 % IV SOLN: 100.0000 mL | INTRAVENOUS | Status: DC | PRN

## 2012-05-04 MED ORDER — NEPRO/CARBSTEADY PO LIQD
237.0000 mL | Freq: Three times a day (TID) | ORAL | Status: DC | PRN
  Administered 2012-05-05: 237 mL via ORAL

## 2012-05-04 MED ORDER — SIMVASTATIN 20 MG PO TABS
20.0000 mg | ORAL_TABLET | Freq: Every day | ORAL | Status: DC
Start: 2012-05-04 — End: 2012-05-05
  Administered 2012-05-04: 20 mg via ORAL
  Filled 2012-05-04 (×2): qty 1

## 2012-05-04 MED ORDER — ONDANSETRON HCL 4 MG/2ML IJ SOLN
INTRAMUSCULAR | Status: AC
  Administered 2012-05-04: 4 mg via INTRAVENOUS
  Filled 2012-05-04: qty 2

## 2012-05-04 MED ORDER — SORBITOL 70 % SOLN: 30.0000 mL | Status: DC | PRN

## 2012-05-04 MED ORDER — ALPRAZOLAM 0.5 MG PO TABS: 1.0000 mg | ORAL_TABLET | Freq: Three times a day (TID) | ORAL | Status: DC | PRN

## 2012-05-04 MED ORDER — CARBAMAZEPINE ER 200 MG PO TB12
300.0000 mg | ORAL_TABLET | Freq: Two times a day (BID) | ORAL | Status: DC
  Administered 2012-05-04 – 2012-05-05 (×2): 300 mg via ORAL
  Filled 2012-05-04 (×3): qty 1

## 2012-05-04 MED ORDER — PANTOPRAZOLE SODIUM 40 MG PO TBEC
40.0000 mg | DELAYED_RELEASE_TABLET | Freq: Every day | ORAL | Status: DC
  Administered 2012-05-04 – 2012-05-05 (×2): 40 mg via ORAL
  Filled 2012-05-04 (×2): qty 1

## 2012-05-04 MED ORDER — CARBAMAZEPINE ER 300 MG PO CP12: 300.0000 mg | ORAL_CAPSULE | Freq: Two times a day (BID) | ORAL | Status: DC

## 2012-05-04 MED ORDER — CALCIUM CARBONATE ANTACID 500 MG PO CHEW
2.0000 | CHEWABLE_TABLET | Freq: Two times a day (BID) | ORAL | Status: DC
  Administered 2012-05-04 – 2012-05-05 (×2): 400 mg via ORAL
  Filled 2012-05-04 (×3): qty 2

## 2012-05-04 MED ORDER — SODIUM CHLORIDE 0.9 % IJ SOLN
3.0000 mL | Freq: Two times a day (BID) | INTRAMUSCULAR | Status: DC
  Administered 2012-05-04 – 2012-05-05 (×2): 3 mL via INTRAVENOUS

## 2012-05-04 MED ORDER — CALCIUM ACETATE 667 MG PO CAPS
667.0000 mg | ORAL_CAPSULE | Freq: Three times a day (TID) | ORAL | Status: DC
  Administered 2012-05-04 – 2012-05-05 (×2): 667 mg via ORAL
  Filled 2012-05-04 (×6): qty 1

## 2012-05-04 MED ORDER — ESCITALOPRAM OXALATE 20 MG PO TABS
20.0000 mg | ORAL_TABLET | Freq: Every day | ORAL | Status: DC
Start: 2012-05-04 — End: 2012-05-05
  Administered 2012-05-04 – 2012-05-05 (×2): 20 mg via ORAL
  Filled 2012-05-04 (×2): qty 1

## 2012-05-04 MED ORDER — DIAZEPAM 5 MG PO TABS
5.0000 mg | ORAL_TABLET | Freq: Once | ORAL | Status: AC
  Administered 2012-05-04: 5 mg via ORAL
  Filled 2012-05-04: qty 1

## 2012-05-04 MED ORDER — DOCUSATE SODIUM 283 MG RE ENEM: 1.0000 | ENEMA | RECTAL | Status: DC | PRN

## 2012-05-04 NOTE — H&P (Signed)
i have personally seen and examined Mr. Radin  I agree with H and P as written by Aurelio Brash, NP Briefly = 51 yo man with ESRD non compliant with HD admitted with pulm edema acute resp failure, chest pain  Plan for HD, tele, Bipap Prn, check cardiac enzymes Jhordyn Hoopingarner
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
## 2012-05-04 NOTE — Procedures (Signed)
I was present at this dialysis session. I have reviewed the session itself and made appropriate changes.   Vinson Moselle, MD [employer] 05/04/2012, 4:28 PM
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
## 2012-05-04 NOTE — Telephone Encounter (Signed)
Accepted patient from [facility] ED - ESRD missed HD - needs now Bipap Maikel Neisler

## 2012-05-05 ENCOUNTER — Inpatient Hospital Stay (HOSPITAL_COMMUNITY): Payer: Medicare Other

## 2012-05-05 ENCOUNTER — Encounter (HOSPITAL_COMMUNITY): Payer: Self-pay | Admitting: General Practice

## 2012-05-05 DIAGNOSIS — G894 Chronic pain syndrome: Secondary | ICD-10-CM

## 2012-05-05 LAB — BASIC METABOLIC PANEL
BUN: 48 mg/dL — ABNORMAL HIGH (ref 6–23)
CO2: 27 mEq/L (ref 19–32)
Calcium: 8.7 mg/dL (ref 8.4–10.5)
Creatinine, Ser: 4.7 mg/dL — ABNORMAL HIGH (ref 0.50–1.35)
Glucose, Bld: 82 mg/dL (ref 70–99)

## 2012-05-05 LAB — CBC
MCH: 26.7 pg (ref 26.0–34.0)
MCHC: 32.4 g/dL (ref 30.0–36.0)
MCV: 82.4 fL (ref 78.0–100.0)
Platelets: 170 10*3/uL (ref 150–400)
RBC: 3.52 MIL/uL — ABNORMAL LOW (ref 4.22–5.81)
RDW: 17.6 % — ABNORMAL HIGH (ref 11.5–15.5)

## 2012-05-05 LAB — CARDIAC PANEL(CRET KIN+CKTOT+MB+TROPI): Relative Index: INVALID (ref 0.0–2.5)

## 2012-05-05 MED ORDER — OXYCODONE-ACETAMINOPHEN 10-325 MG PO TABS: 1.0000 | ORAL_TABLET | ORAL | Status: DC | PRN

## 2012-05-05 MED ORDER — ALPRAZOLAM 0.5 MG PO TABS
1.0000 mg | ORAL_TABLET | Freq: Three times a day (TID) | ORAL | Status: DC | PRN
  Administered 2012-05-05: 1 mg via ORAL
  Filled 2012-05-05: qty 2

## 2012-05-05 MED ORDER — OXYCODONE HCL 5 MG PO TABS
5.0000 mg | ORAL_TABLET | ORAL | Status: DC | PRN
  Administered 2012-05-05: 5 mg via ORAL
  Filled 2012-05-05: qty 1

## 2012-05-05 MED ORDER — AMOXICILLIN 500 MG PO CAPS: 500.0000 mg | ORAL_CAPSULE | Freq: Every morning | ORAL | Status: DC

## 2012-05-05 MED ORDER — TRAZODONE HCL 50 MG PO TABS
50.0000 mg | ORAL_TABLET | Freq: Every evening | ORAL | Status: DC | PRN
  Filled 2012-05-05: qty 1

## 2012-05-05 MED ORDER — CALCIUM ACETATE 667 MG PO CAPS: 667.0000 mg | ORAL_CAPSULE | Freq: Three times a day (TID) | ORAL | Status: DC

## 2012-05-05 MED ORDER — OXYCODONE-ACETAMINOPHEN 5-325 MG PO TABS
1.0000 | ORAL_TABLET | ORAL | Status: DC | PRN
  Administered 2012-05-05: 1 via ORAL
  Filled 2012-05-05: qty 1

## 2012-05-05 MED ORDER — AMOXICILLIN 500 MG PO CAPS
500.0000 mg | ORAL_CAPSULE | Freq: Every morning | ORAL | Status: DC
  Filled 2012-05-05: qty 1

## 2012-05-05 NOTE — Procedures (Signed)
I was present at this dialysis session. I have reviewed the session itself and made appropriate changes.   Vinson Moselle, MD [employer] 05/05/2012, 3:52 PM

## 2012-05-05 NOTE — Progress Notes (Signed)
Pt. Given discharge instructions utilizing "teach back" technique.  Reviewed appointments, prescriptions, and dietary information in relation to renal, CHF, and respiratory status.  Pt. Appears to have good understanding of present hospitalization and need to comply with regimen.

## 2012-05-05 NOTE — Discharge Summary (Signed)
Physician Discharge Summary  Sean [name] XBJ:[id number] DOB: 1961-10-24 DOA: 05/04/2012  PCP: Michele Mcalpine, MD  Admit date: 05/04/2012 Discharge date: 05/05/2012  Discharge Diagnoses:  Principal Problem:  *Acute respiratory failure Active Problems:  HEPATITIS C  HYPOTHYROIDISM, BORDERLINE  HYPERCHOLESTEROLEMIA  DEPRESSION  GERD  RENAL FAILURE, END STAGE  SEIZURE DISORDER  Patient non adherence  Chronic pain syndrome  Anxiety   Discharge Condition: Good  Diet recommendation: Renal  History of present illness:  51 year old gentleman who missed dialysis present in respiratory distress  Hospital Course:  51 year 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 She was found to be hypoxic in pulmonary edema. He was transferred emergently to [facility]. He did not have any signs of acute infection, no fever no significant leukocytosis.. he had normal cardiac enzymes and a nonacute EKG. He was treated with hemodialysis treatments twice on July 3 and July 4 at [place] and prescribed by [facility]. The patient improved back to his baseline. Otherwise we have continued his medications without changes. The only other new problem for this patient was a broken tooth with severe caries, incipient dental abscess for which we prescribed analgesic and antibiotic. Patient has assured me that he will followup with the dentist in [place]  Procedures:  Hemodialysis  Consultations:  Renal  Discharge Exam: Filed Vitals:   05/05/12 0700  BP: 150/85  Pulse: 63  Temp: 98.4 F (36.9 C)  Resp: 19   Filed Vitals:   05/05/12 0428 05/05/12 0500 05/05/12 0600 05/05/12 0700  BP: 159/82 144/72 167/87 150/85  Pulse: 62 63 63 63  Temp: 98.3 F (36.8 C)   98.4 F (36.9 C)  TempSrc: Oral   Oral  Resp: 19 16 16 19   Height:      Weight: 56.8 kg (125 lb 3.5 oz)     SpO2: 97%    99%   General: Alert and oriented x3 Cardiovascular: Regular rate and rhythm Respiratory: Clear to auscultation  Discharge Instructions  Discharge Orders    Future Appointments: Provider: Department: Dept Phone: Center:   09/09/2012 10:00 AM Michele Mcalpine, MD [facility] [phone] None     Future Orders Please Complete By Expires   Diet renal 60/70-12-04-1198      Increase activity slowly        Medication List  As of 05/05/2012 11:58 AM   TAKE these medications         albuterol 108 (90 BASE) MCG/ACT inhaler   Commonly known as: PROVENTIL HFA;VENTOLIN HFA   Inhale 2 puffs into the lungs every 6 (six) hours as needed. For shortness of breath      ALPRAZolam 1 MG tablet   Commonly known as: XANAX   Take 1 mg by mouth 3 (three) times daily as needed. For anxiety      amLODipine 5 MG tablet   Commonly known as: NORVASC   Take 1 tablet (5 mg total) by mouth daily.      amoxicillin 500 MG capsule   Commonly known as: AMOXIL   Take 1 capsule (500 mg total) by mouth every morning.      calcium acetate 667 MG capsule   Commonly known as: PHOSLO   Take by mouth 3 (three) times daily with meals.      calcium carbonate 500 MG chewable tablet   Commonly known as: TUMS - dosed in mg elemental calcium   Chew  2 tablets by mouth 2 (two) times daily.      carbamazepine 300 MG 12 hr capsule   Commonly known as: CARBATROL   Take 300 mg by mouth 2 (two) times daily.      diazepam 10 MG tablet   Commonly known as: VALIUM   Take 1 tablet by mouth Three times a day.      escitalopram 20 MG tablet   Commonly known as: LEXAPRO   Take 20 mg by mouth daily.      hydrocortisone 25 MG suppository   Commonly known as: ANUSOL-HC   Place 1 suppository (25 mg total) rectally every 12 (twelve) hours.      hydrOXYzine 25 MG tablet   Commonly known as: ATARAX/VISTARIL   Take 25 mg by mouth every 6 (six) hours as needed. For itching      metoprolol 50 MG tablet   Commonly known as:  LOPRESSOR   Take 50 mg by mouth 2 (two) times daily.      oxyCODONE-acetaminophen 10-325 MG per tablet   Commonly known as: PERCOCET   Take 1 tablet by mouth every 4 (four) hours as needed for pain. For headache/pain      pantoprazole 40 MG tablet   Commonly known as: PROTONIX   Take 40 mg by mouth daily.      simvastatin 20 MG tablet   Commonly known as: ZOCOR   Take 20 mg by mouth at bedtime.      traZODone 50 MG tablet   Commonly known as: DESYREL   Take 1 tablet by mouth at bedtime as needed. For sleep           Follow-up Information    Schedule an appointment as soon as possible for a visit with Michele Mcalpine, MD.   Contact information:   [facility] [address] [place] [address] [phone]           The results of significant diagnostics from this hospitalization (including imaging, microbiology, ancillary and laboratory) are listed below for reference.    Significant Diagnostic Studies: Dg Chest 2 View  05/04/2012  *RADIOLOGY REPORT*  Clinical Data: CHF, post dialysis.  Follow-up.  CHEST - 2 VIEW  Comparison: 05/04/2012  Findings: Improving pulmonary edema pattern.  Mild interstitial prominence and left perihilar opacity persists, likely residual edema.  Cannot completely exclude left perihilar pneumonia. Recommend clinical correlation.  Cardiomegaly.  Small left pleural effusion.  IMPRESSION: Improving interstitial and airspace opacities bilaterally.  Mild interstitial prominence and left perihilar opacity persist, likely edema, but recommend clinical correlation to exclude infection.  Original Report Authenticated By: Cyndie Chime, M.D.    Microbiology: Recent Results (from the past 240 hour(s))  MRSA PCR SCREENING     Status: Normal   Collection Time   05/04/12  6:41 PM      Component Value Range Status Comment   MRSA by PCR NEGATIVE  NEGATIVE Final      Labs: Basic Metabolic Panel:  Lab 05/05/12 4401 05/04/12  1331  NA 134* 135  K 5.2* 6.2*  CL 93* 94*  CO2 27 20  GLUCOSE 82 171*  BUN 48* 103*  CREATININE 4.70* 8.18*  CALCIUM 8.7 9.5  MG -- --  PHOS -- 1.8*   Liver Function Tests:  Lab 05/04/12 1331  AST --  ALT --  ALKPHOS --  BILITOT --  PROT --  ALBUMIN 3.2*   No results found for this basename: LIPASE:5,AMYLASE:5 in  the last 168 hours No results found for this basename: AMMONIA:5 in the last 168 hours CBC:  Lab 05/05/12 0715 05/04/12 1330  WBC 7.2 15.9*  NEUTROABS -- --  HGB 9.4* 9.8*  HCT 29.0* 29.7*  MCV 82.4 81.1  PLT 170 251   Cardiac Enzymes:  Lab 05/05/12 0715 05/04/12 2006  CKTOTAL 16 13  CKMB 1.2 1.6  CKMBINDEX -- --  TROPONINI <0.30 <0.30   BNP: BNP (last 3 results)  Basename 11/15/11 1025  PROBNP >70000.0*   CBG: No results found for this basename: GLUCAP:5 in the last 168 hours  Time coordinating discharge: 35 minutes  Signed:  Lonia Blood, MD  [facility] 05/05/2012, 11:58 AM

## 2012-05-05 NOTE — Progress Notes (Signed)
Pt. Discharged to home.  He ambulated to exit accompanied by nurse tech per his request.  Friend to pick him up in lobby via private vehicle.

## 2012-05-05 NOTE — Progress Notes (Signed)
Subjective:  Feels much better, no SOB or CP  Objective:    Vital signs in last 24 hours: Filed Vitals:   05/05/12 0428 05/05/12 0500 05/05/12 0600 05/05/12 0700  BP: 159/82 144/72 167/87 150/85  Pulse: 62 63 63 63  Temp: 98.3 F (36.8 C)   98.4 F (36.9 C)  TempSrc: Oral   Oral  Resp: 19 16 16 19   Height:      Weight: 56.8 kg (125 lb 3.5 oz)     SpO2: 97%   99%   Weight change:   Intake/Output Summary (Last 24 hours) at 05/05/12 1120 Last data filed at 05/05/12 1000  Gross per 24 hour  Intake    802 ml  Output   4900 ml  Net  -4098 ml   Labs: Basic Metabolic Panel:  Lab 05/05/12 1478 05/04/12 1331  NA 134* 135  K 5.2* 6.2*  CL 93* 94*  CO2 27 20  GLUCOSE 82 171*  BUN 48* 103*  CREATININE 4.70* 8.18*  ALB -- --  CALCIUM 8.7 9.5  PHOS -- 1.8*   Liver Function Tests:  Lab 05/04/12 1331  AST --  ALT --  ALKPHOS --  BILITOT --  PROT --  ALBUMIN 3.2*   No results found for this basename: LIPASE:3,AMYLASE:3 in the last 168 hours No results found for this basename: AMMONIA:3 in the last 168 hours CBC:  Lab 05/05/12 0715 05/04/12 1330  WBC 7.2 15.9*  NEUTROABS -- --  HGB 9.4* 9.8*  HCT 29.0* 29.7*  MCV 82.4 81.1  PLT 170 251   Cardiac Enzymes:  Lab 05/05/12 0715 05/04/12 2006  CKTOTAL 16 13  CKMB 1.2 1.6  CKMBINDEX -- --  TROPONINI <0.30 <0.30   CBG: No results found for this basename: GLUCAP:5 in the last 168 hours  Iron Studies: No results found for this basename: IRON:30,TIBC:30,SATURATION RATIOS:30,TRANSFERRIN:30,FERRITIN:30 in the last 168 hours  Physical Exam:  Blood pressure 150/85, pulse 63, temperature 98.4 F (36.9 C), temperature source Oral, resp. rate 19, height 5\' 11"  (1.803 m), weight 56.8 kg (125 lb 3.5 oz), SpO2 99.00%.  General: up in room, no dyspnea Heart: RRR no rub or gallop Lungs: clear today Abdomen: soft NT ND  Extremities: no LE edema; pitting finger nails.  Skin: multiple tattos on arms, hands, trunk  Neuro: alert  and oriented  Dialysis Access: left upper AVF patent + bruit and thrill  Dialysis Orders: Center: [place] on TTS  EDW 55.5 HD Bath 2K 2.5 Ca Time 3.75 hr Heparin- none Access left upper AVF BFR 400 DFR A 1.5 Zemplar 1 mcg IV/HD Epogen none Venofer None due to PCT  Labs from [place]: WBC 14.3 75% N, 14.6% L Hgb 11.1 plts 309K N141 K 5.8 Cl 98 CO2 20 Cr 8.2 BUN 101 glu 116 Ca 10 P 2.1 Alb 5.3 BNP 158,000 CK < 20 Trop I 0.03 LFTs ok; no ABGs  EKG [place] - NSR, LVH, nonspecific ST abnormalities   Assessment/Plan:  1. Volume overload/pulm edema - much better, f/u cxr with clearing of most of edema, today is up 1.5kg over dry wt.  2. ESRD - TTS [place] - HD today on schedule.  3. Hypertension- norvasc, metoprolol 4. Anemia - As per # 2; Hgb 11.1 today, but may have dilutional component as he is up 7 kg 5. Metabolic bone disease - P low; no binders; continue zemplar 6. Hep C + 7. Tobacco abuse - tobacco free x 1 week; had adverse RXN from chantix  8. Hx seizure d/o on carbatrol 9. Disp - per primary; have discussed with Dr. Lavera Guise, possible d/c tomorrow if no other issues identified. 10. Dispo - OK for discharge after HD today from renal standpoint     Vinson Moselle  MD [facility] [phone] pgr    [phone] cell 05/05/2012, 11:20 AM

## 2012-05-09 ENCOUNTER — Inpatient Hospital Stay (HOSPITAL_COMMUNITY)
Admission: AD | Admit: 2012-05-09 | Discharge: 2012-05-11 | DRG: 377 | Disposition: A | Discharge status: Home / Self Care | Payer: Medicare Other | Source: Other Acute Inpatient Hospital | Attending: Internal Medicine | Admitting: Internal Medicine

## 2012-05-09 ENCOUNTER — Encounter (HOSPITAL_COMMUNITY): Payer: Self-pay | Admitting: *Deleted

## 2012-05-09 ENCOUNTER — Inpatient Hospital Stay (HOSPITAL_COMMUNITY): Payer: Medicare Other

## 2012-05-09 ENCOUNTER — Telehealth: Payer: Self-pay | Admitting: Internal Medicine

## 2012-05-09 DIAGNOSIS — I472 Ventricular tachycardia: Secondary | ICD-10-CM

## 2012-05-09 DIAGNOSIS — N186 End stage renal disease: Secondary | ICD-10-CM | POA: Diagnosis present

## 2012-05-09 DIAGNOSIS — K047 Periapical abscess without sinus: Secondary | ICD-10-CM | POA: Diagnosis present

## 2012-05-09 DIAGNOSIS — R569 Unspecified convulsions: Secondary | ICD-10-CM | POA: Diagnosis present

## 2012-05-09 DIAGNOSIS — F17200 Nicotine dependence, unspecified, uncomplicated: Secondary | ICD-10-CM | POA: Diagnosis present

## 2012-05-09 DIAGNOSIS — K5900 Constipation, unspecified: Secondary | ICD-10-CM | POA: Diagnosis present

## 2012-05-09 DIAGNOSIS — R634 Abnormal weight loss: Secondary | ICD-10-CM

## 2012-05-09 DIAGNOSIS — R197 Diarrhea, unspecified: Secondary | ICD-10-CM

## 2012-05-09 DIAGNOSIS — K5730 Diverticulosis of large intestine without perforation or abscess without bleeding: Secondary | ICD-10-CM

## 2012-05-09 DIAGNOSIS — D126 Benign neoplasm of colon, unspecified: Secondary | ICD-10-CM

## 2012-05-09 DIAGNOSIS — J329 Chronic sinusitis, unspecified: Secondary | ICD-10-CM

## 2012-05-09 DIAGNOSIS — R51 Headache: Secondary | ICD-10-CM

## 2012-05-09 DIAGNOSIS — J209 Acute bronchitis, unspecified: Secondary | ICD-10-CM

## 2012-05-09 DIAGNOSIS — K648 Other hemorrhoids: Secondary | ICD-10-CM | POA: Diagnosis present

## 2012-05-09 DIAGNOSIS — E039 Hypothyroidism, unspecified: Secondary | ICD-10-CM

## 2012-05-09 DIAGNOSIS — D62 Acute posthemorrhagic anemia: Secondary | ICD-10-CM | POA: Diagnosis present

## 2012-05-09 DIAGNOSIS — K219 Gastro-esophageal reflux disease without esophagitis: Secondary | ICD-10-CM | POA: Diagnosis present

## 2012-05-09 DIAGNOSIS — B1920 Unspecified viral hepatitis C without hepatic coma: Secondary | ICD-10-CM | POA: Diagnosis present

## 2012-05-09 DIAGNOSIS — R1032 Left lower quadrant pain: Secondary | ICD-10-CM

## 2012-05-09 DIAGNOSIS — K922 Gastrointestinal hemorrhage, unspecified: Principal | ICD-10-CM | POA: Diagnosis present

## 2012-05-09 DIAGNOSIS — J189 Pneumonia, unspecified organism: Secondary | ICD-10-CM

## 2012-05-09 DIAGNOSIS — F411 Generalized anxiety disorder: Secondary | ICD-10-CM

## 2012-05-09 DIAGNOSIS — B1710 Acute hepatitis C without hepatic coma: Secondary | ICD-10-CM

## 2012-05-09 DIAGNOSIS — J9600 Acute respiratory failure, unspecified whether with hypoxia or hypercapnia: Secondary | ICD-10-CM | POA: Diagnosis present

## 2012-05-09 DIAGNOSIS — Z9119 Patient's noncompliance with other medical treatment and regimen: Secondary | ICD-10-CM

## 2012-05-09 DIAGNOSIS — K625 Hemorrhage of anus and rectum: Secondary | ICD-10-CM

## 2012-05-09 DIAGNOSIS — I10 Essential (primary) hypertension: Secondary | ICD-10-CM | POA: Diagnosis present

## 2012-05-09 DIAGNOSIS — Z992 Dependence on renal dialysis: Secondary | ICD-10-CM

## 2012-05-09 DIAGNOSIS — K2990 Gastroduodenitis, unspecified, without bleeding: Secondary | ICD-10-CM

## 2012-05-09 DIAGNOSIS — E7800 Pure hypercholesterolemia, unspecified: Secondary | ICD-10-CM | POA: Diagnosis present

## 2012-05-09 DIAGNOSIS — F419 Anxiety disorder, unspecified: Secondary | ICD-10-CM

## 2012-05-09 DIAGNOSIS — N189 Chronic kidney disease, unspecified: Secondary | ICD-10-CM

## 2012-05-09 DIAGNOSIS — Z91158 Patient's noncompliance with renal dialysis for other reason: Secondary | ICD-10-CM

## 2012-05-09 DIAGNOSIS — E875 Hyperkalemia: Secondary | ICD-10-CM | POA: Diagnosis present

## 2012-05-09 DIAGNOSIS — G894 Chronic pain syndrome: Secondary | ICD-10-CM

## 2012-05-09 DIAGNOSIS — Z9115 Patient's noncompliance with renal dialysis: Secondary | ICD-10-CM

## 2012-05-09 DIAGNOSIS — F329 Major depressive disorder, single episode, unspecified: Secondary | ICD-10-CM

## 2012-05-09 DIAGNOSIS — Z9189 Other specified personal risk factors, not elsewhere classified: Secondary | ICD-10-CM

## 2012-05-09 DIAGNOSIS — I120 Hypertensive chronic kidney disease with stage 5 chronic kidney disease or end stage renal disease: Secondary | ICD-10-CM | POA: Diagnosis present

## 2012-05-09 DIAGNOSIS — F341 Dysthymic disorder: Secondary | ICD-10-CM | POA: Diagnosis present

## 2012-05-09 LAB — COMPREHENSIVE METABOLIC PANEL
ALT: 23 U/L (ref 0–53)
Alkaline Phosphatase: 197 U/L — ABNORMAL HIGH (ref 39–117)
CO2: 20 mEq/L (ref 19–32)
Chloride: 102 mEq/L (ref 96–112)
GFR calc Af Amer: 9 mL/min — ABNORMAL LOW (ref >90)
GFR calc non Af Amer: 8 mL/min — ABNORMAL LOW (ref >90)
Glucose, Bld: 85 mg/dL (ref 70–99)
Potassium: 5.3 mEq/L — ABNORMAL HIGH (ref 3.5–5.1)
Sodium: 140 mEq/L (ref 135–145)
Total Bilirubin: 0.3 mg/dL (ref 0.3–1.2)
Total Protein: 5.9 g/dL — ABNORMAL LOW (ref 6.0–8.3)

## 2012-05-09 LAB — CBC WITH DIFFERENTIAL/PLATELET
Lymphocytes Relative: 28 % (ref 12–46)
Lymphs Abs: 2.1 10*3/uL (ref 0.7–4.0)
Neutro Abs: 4.3 10*3/uL (ref 1.7–7.7)
Neutrophils Relative %: 56 % (ref 43–77)
Platelets: 167 10*3/uL (ref 150–400)

## 2012-05-09 MED ORDER — AMOXICILLIN-POT CLAVULANATE 875-125 MG PO TABS
1.0000 | ORAL_TABLET | Freq: Two times a day (BID) | ORAL | Status: DC
  Administered 2012-05-09 – 2012-05-10 (×2): 1 via ORAL
  Filled 2012-05-09 (×3): qty 1

## 2012-05-09 MED ORDER — OXYCODONE-ACETAMINOPHEN 5-325 MG PO TABS
1.0000 | ORAL_TABLET | ORAL | Status: DC | PRN
  Administered 2012-05-09 – 2012-05-10 (×3): 1 via ORAL
  Filled 2012-05-09 (×3): qty 1

## 2012-05-09 MED ORDER — CARBAMAZEPINE ER 200 MG PO TB12
300.0000 mg | ORAL_TABLET | Freq: Two times a day (BID) | ORAL | Status: DC
  Administered 2012-05-09 – 2012-05-10 (×3): 300 mg via ORAL
  Filled 2012-05-09 (×6): qty 1

## 2012-05-09 MED ORDER — NICOTINE 21 MG/24HR TD PT24
21.0000 mg | MEDICATED_PATCH | TRANSDERMAL | Status: DC
  Administered 2012-05-09 – 2012-05-10 (×2): 21 mg via TRANSDERMAL
  Filled 2012-05-09 (×3): qty 1

## 2012-05-09 MED ORDER — ONDANSETRON HCL 4 MG PO TABS
4.0000 mg | ORAL_TABLET | Freq: Four times a day (QID) | ORAL | Status: DC | PRN
  Filled 2012-05-09: qty 1

## 2012-05-09 MED ORDER — ACETAMINOPHEN 650 MG RE SUPP: 650.0000 mg | Freq: Four times a day (QID) | RECTAL | Status: DC | PRN

## 2012-05-09 MED ORDER — ACETAMINOPHEN 325 MG PO TABS: 650.0000 mg | ORAL_TABLET | Freq: Four times a day (QID) | ORAL | Status: DC | PRN

## 2012-05-09 MED ORDER — SODIUM CHLORIDE 0.9 % IJ SOLN
3.0000 mL | Freq: Two times a day (BID) | INTRAMUSCULAR | Status: DC
  Administered 2012-05-09 – 2012-05-10 (×3): 3 mL via INTRAVENOUS

## 2012-05-09 MED ORDER — SODIUM CHLORIDE 0.9 % IV SOLN: 100.0000 mL | INTRAVENOUS | Status: DC | PRN

## 2012-05-09 MED ORDER — ONDANSETRON HCL 4 MG/2ML IJ SOLN
4.0000 mg | Freq: Four times a day (QID) | INTRAMUSCULAR | Status: DC | PRN
  Administered 2012-05-10: 4 mg via INTRAVENOUS

## 2012-05-09 MED ORDER — ZOLPIDEM TARTRATE 5 MG PO TABS
5.0000 mg | ORAL_TABLET | Freq: Every evening | ORAL | Status: DC | PRN
  Administered 2012-05-09 – 2012-05-10 (×2): 5 mg via ORAL
  Filled 2012-05-09 (×2): qty 1

## 2012-05-09 MED ORDER — PENTAFLUOROPROP-TETRAFLUOROETH EX AERO: 1.0000 "application " | INHALATION_SPRAY | CUTANEOUS | Status: DC | PRN

## 2012-05-09 MED ORDER — LIDOCAINE HCL (PF) 1 % IJ SOLN
5.0000 mL | INTRAMUSCULAR | Status: DC | PRN
  Filled 2012-05-09: qty 5

## 2012-05-09 MED ORDER — AMLODIPINE BESYLATE 5 MG PO TABS: 5.0000 mg | ORAL_TABLET | Freq: Every day | ORAL | Status: DC

## 2012-05-09 MED ORDER — CALCIUM ACETATE 667 MG PO CAPS
667.0000 mg | ORAL_CAPSULE | Freq: Three times a day (TID) | ORAL | Status: DC
  Filled 2012-05-09 (×2): qty 1

## 2012-05-09 MED ORDER — SORBITOL 70 % SOLN
30.0000 mL | Status: DC | PRN
  Filled 2012-05-09: qty 30

## 2012-05-09 MED ORDER — OXYCODONE HCL 5 MG PO TABS
5.0000 mg | ORAL_TABLET | ORAL | Status: DC | PRN
  Administered 2012-05-09 – 2012-05-11 (×6): 5 mg via ORAL
  Filled 2012-05-09 (×5): qty 1

## 2012-05-09 MED ORDER — HYDROXYZINE HCL 25 MG PO TABS: 25.0000 mg | ORAL_TABLET | Freq: Four times a day (QID) | ORAL | Status: DC | PRN

## 2012-05-09 MED ORDER — PNEUMOCOCCAL VAC POLYVALENT 25 MCG/0.5ML IJ INJ
0.5000 mL | INJECTION | INTRAMUSCULAR | Status: AC
  Administered 2012-05-10: 0.5 mL via INTRAMUSCULAR
  Filled 2012-05-09: qty 0.5

## 2012-05-09 MED ORDER — TRAZODONE HCL 50 MG PO TABS
50.0000 mg | ORAL_TABLET | Freq: Every evening | ORAL | Status: DC | PRN
  Filled 2012-05-09: qty 1

## 2012-05-09 MED ORDER — DOCUSATE SODIUM 283 MG RE ENEM
1.0000 | ENEMA | RECTAL | Status: DC | PRN
  Filled 2012-05-09: qty 1

## 2012-05-09 MED ORDER — METOPROLOL TARTRATE 50 MG PO TABS
50.0000 mg | ORAL_TABLET | Freq: Two times a day (BID) | ORAL | Status: DC
  Filled 2012-05-09: qty 1

## 2012-05-09 MED ORDER — PARICALCITOL 5 MCG/ML IV SOLN
1.0000 ug | INTRAVENOUS | Status: DC
  Administered 2012-05-10: 1 ug via INTRAVENOUS
  Filled 2012-05-09: qty 0.2

## 2012-05-09 MED ORDER — ALTEPLASE 2 MG IJ SOLR
2.0000 mg | Freq: Once | INTRAMUSCULAR | Status: AC | PRN
  Filled 2012-05-09: qty 2

## 2012-05-09 MED ORDER — OXYCODONE-ACETAMINOPHEN 10-325 MG PO TABS: 1.0000 | ORAL_TABLET | ORAL | Status: DC | PRN

## 2012-05-09 MED ORDER — LIDOCAINE-PRILOCAINE 2.5-2.5 % EX CREA
1.0000 "application " | TOPICAL_CREAM | CUTANEOUS | Status: DC | PRN
  Filled 2012-05-09: qty 5

## 2012-05-09 MED ORDER — NEPRO/CARBSTEADY PO LIQD
237.0000 mL | Freq: Three times a day (TID) | ORAL | Status: DC | PRN
  Filled 2012-05-09: qty 237

## 2012-05-09 MED ORDER — DIAZEPAM 5 MG PO TABS
10.0000 mg | ORAL_TABLET | Freq: Three times a day (TID) | ORAL | Status: DC
  Administered 2012-05-09 – 2012-05-11 (×6): 10 mg via ORAL
  Filled 2012-05-09 (×6): qty 2

## 2012-05-09 MED ORDER — ALUM & MAG HYDROXIDE-SIMETH 200-200-20 MG/5ML PO SUSP: 30.0000 mL | Freq: Four times a day (QID) | ORAL | Status: DC | PRN

## 2012-05-09 MED ORDER — METOPROLOL TARTRATE 50 MG PO TABS
50.0000 mg | ORAL_TABLET | Freq: Two times a day (BID) | ORAL | Status: DC
  Administered 2012-05-09 – 2012-05-11 (×4): 50 mg via ORAL
  Filled 2012-05-09 (×5): qty 1

## 2012-05-09 MED ORDER — CAMPHOR-MENTHOL 0.5-0.5 % EX LOTN
1.0000 "application " | TOPICAL_LOTION | Freq: Three times a day (TID) | CUTANEOUS | Status: DC | PRN
  Filled 2012-05-09: qty 222

## 2012-05-09 MED ORDER — CALCIUM CARBONATE ANTACID 500 MG PO CHEW
2.0000 | CHEWABLE_TABLET | Freq: Two times a day (BID) | ORAL | Status: DC
  Filled 2012-05-09: qty 2

## 2012-05-09 MED ORDER — ESCITALOPRAM OXALATE 20 MG PO TABS
20.0000 mg | ORAL_TABLET | Freq: Every day | ORAL | Status: DC
  Administered 2012-05-10 – 2012-05-11 (×2): 20 mg via ORAL
  Filled 2012-05-09 (×2): qty 1

## 2012-05-09 MED ORDER — CARBAMAZEPINE ER 300 MG PO CP12: 300.0000 mg | ORAL_CAPSULE | Freq: Two times a day (BID) | ORAL | Status: DC

## 2012-05-09 MED ORDER — AMLODIPINE BESYLATE 5 MG PO TABS
5.0000 mg | ORAL_TABLET | Freq: Every day | ORAL | Status: DC
  Administered 2012-05-09 – 2012-05-10 (×2): 5 mg via ORAL
  Filled 2012-05-09 (×3): qty 1

## 2012-05-09 MED ORDER — ONDANSETRON HCL 4 MG PO TABS
4.0000 mg | ORAL_TABLET | Freq: Four times a day (QID) | ORAL | Status: DC | PRN
  Administered 2012-05-10 (×2): 4 mg via ORAL
  Filled 2012-05-09: qty 1

## 2012-05-09 MED ORDER — ONDANSETRON HCL 4 MG/2ML IJ SOLN
4.0000 mg | Freq: Four times a day (QID) | INTRAMUSCULAR | Status: DC | PRN
  Filled 2012-05-09: qty 2

## 2012-05-09 MED ORDER — DARBEPOETIN ALFA-POLYSORBATE 60 MCG/0.3ML IJ SOLN
60.0000 ug | INTRAMUSCULAR | Status: DC
  Administered 2012-05-10: 60 ug via INTRAVENOUS
  Filled 2012-05-09: qty 0.3

## 2012-05-09 MED ORDER — HYDROCORTISONE ACETATE 25 MG RE SUPP
25.0000 mg | Freq: Two times a day (BID) | RECTAL | Status: DC
  Administered 2012-05-09 – 2012-05-11 (×4): 25 mg via RECTAL
  Filled 2012-05-09 (×5): qty 1

## 2012-05-09 MED ORDER — HEPARIN SODIUM (PORCINE) 1000 UNIT/ML DIALYSIS
1000.0000 [IU] | INTRAMUSCULAR | Status: DC | PRN
  Filled 2012-05-09: qty 1

## 2012-05-09 MED ORDER — HYDROMORPHONE HCL PF 1 MG/ML IJ SOLN
1.0000 mg | INTRAMUSCULAR | Status: DC | PRN
  Administered 2012-05-09 – 2012-05-11 (×8): 1 mg via INTRAVENOUS
  Filled 2012-05-09 (×8): qty 1

## 2012-05-09 MED ORDER — SACCHAROMYCES BOULARDII 250 MG PO CAPS
250.0000 mg | ORAL_CAPSULE | Freq: Two times a day (BID) | ORAL | Status: DC
  Administered 2012-05-09 – 2012-05-11 (×4): 250 mg via ORAL
  Filled 2012-05-09 (×5): qty 1

## 2012-05-09 MED ORDER — CALCIUM CARBONATE 1250 MG/5ML PO SUSP
500.0000 mg | Freq: Four times a day (QID) | ORAL | Status: DC | PRN
  Filled 2012-05-09: qty 5

## 2012-05-09 MED ORDER — SIMVASTATIN 20 MG PO TABS
20.0000 mg | ORAL_TABLET | Freq: Every day | ORAL | Status: DC
Start: 2012-05-09 — End: 2012-05-11
  Administered 2012-05-09 – 2012-05-10 (×2): 20 mg via ORAL
  Filled 2012-05-09 (×3): qty 1

## 2012-05-09 MED ORDER — ALBUTEROL SULFATE HFA 108 (90 BASE) MCG/ACT IN AERS
2.0000 | INHALATION_SPRAY | Freq: Four times a day (QID) | RESPIRATORY_TRACT | Status: DC | PRN
  Administered 2012-05-10: 2 via RESPIRATORY_TRACT
  Filled 2012-05-09: qty 6.7

## 2012-05-09 MED ORDER — ALPRAZOLAM 0.5 MG PO TABS
1.0000 mg | ORAL_TABLET | Freq: Three times a day (TID) | ORAL | Status: DC | PRN
  Administered 2012-05-10: 1 mg via ORAL
  Filled 2012-05-09: qty 2

## 2012-05-09 MED ORDER — PANTOPRAZOLE SODIUM 40 MG PO TBEC
40.0000 mg | DELAYED_RELEASE_TABLET | Freq: Every day | ORAL | Status: DC
  Administered 2012-05-10 – 2012-05-11 (×2): 40 mg via ORAL
  Filled 2012-05-09 (×2): qty 1
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
## 2012-05-09 NOTE — Telephone Encounter (Signed)
ESRD T,Th,Sat dialysis, H/O PUD, Hep C (no varices that he knows) comes in for melanotic stools x 3 days, Hb at [facility] 3 days ago was 11 today per ER MD is 8.8, looked pale, on PPI drip, type crossed, GI and Renal to be called once he gets here. Vitals, K stable.
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
## 2012-05-09 NOTE — Consult Note (Signed)
I have personally seen and examined this patient and agree with the assessment/plan as outlined above by Sean Blanks NP. Sean [name] is readmitted to the hospital with lower gastrointestinal bleeding (melena and now hematochezia) of 2 days in duration. He was recently discharged from the hospital after admitted for acute respiratory failure secondary to volume overload from truncated dialysis treatments in the recent past. On his current admission, he also voices complaints of oral pain/jaw pain which recently were suspected to be from a dental abscess. In April of this year, he had colonoscopy and EGD for inquiring to gastrointestinal bleeding-currently on observation with stable hemodynamic status and what appears to be a fair hemoglobin level. Scheduled for dialysis tomorrow without any heparin. Sean [name] K.,MD 05/09/2012 5:01 PM

## 2012-05-09 NOTE — Consult Note (Signed)
I have taken a history, examined the patient and reviewed the chart. I agree with the extender's note, impression and recommendations. Colonoscopy performed in April 2013. Presumed internal hemorrhoidal bleeding. Standard hemorrhoidal care. Monitor CBC and bleeding.   Meryl Dare MD [facility]

## 2012-05-10 ENCOUNTER — Inpatient Hospital Stay (HOSPITAL_COMMUNITY): Payer: Medicare Other

## 2012-05-10 DIAGNOSIS — G894 Chronic pain syndrome: Secondary | ICD-10-CM

## 2012-05-10 DIAGNOSIS — J9600 Acute respiratory failure, unspecified whether with hypoxia or hypercapnia: Secondary | ICD-10-CM

## 2012-05-10 LAB — RENAL FUNCTION PANEL
Albumin: 2.9 g/dL — ABNORMAL LOW (ref 3.5–5.2)
Chloride: 97 mEq/L (ref 96–112)
GFR calc non Af Amer: 7 mL/min — ABNORMAL LOW (ref >90)
Potassium: 6.4 mEq/L (ref 3.5–5.1)

## 2012-05-10 LAB — CBC
Platelets: 209 10*3/uL (ref 150–400)
RDW: 18.5 % — ABNORMAL HIGH (ref 11.5–15.5)
WBC: 10.7 10*3/uL — ABNORMAL HIGH (ref 4.0–10.5)

## 2012-05-10 MED ORDER — DARBEPOETIN ALFA-POLYSORBATE 60 MCG/0.3ML IJ SOLN
INTRAMUSCULAR | Status: AC
  Administered 2012-05-10: 60 ug via INTRAVENOUS
  Filled 2012-05-10: qty 0.3

## 2012-05-10 MED ORDER — HYDROMORPHONE HCL PF 1 MG/ML IJ SOLN
INTRAMUSCULAR | Status: AC
  Administered 2012-05-10: 1 mg via INTRAVENOUS
  Filled 2012-05-10: qty 1

## 2012-05-10 MED ORDER — PARICALCITOL 5 MCG/ML IV SOLN
INTRAVENOUS | Status: AC
  Administered 2012-05-10: 1 ug via INTRAVENOUS
  Filled 2012-05-10: qty 1

## 2012-05-10 MED ORDER — AMOXICILLIN-POT CLAVULANATE 500-125 MG PO TABS
1.0000 | ORAL_TABLET | Freq: Every day | ORAL | Status: DC
  Administered 2012-05-11: 500 mg via ORAL
  Filled 2012-05-10: qty 1

## 2012-05-10 MED ORDER — OXYCODONE HCL 5 MG PO TABS
ORAL_TABLET | ORAL | Status: AC
  Administered 2012-05-10: 5 mg via ORAL
  Filled 2012-05-10: qty 1

## 2012-05-10 MED ORDER — MENTHOL 3 MG MT LOZG
1.0000 | LOZENGE | OROMUCOSAL | Status: DC | PRN
  Filled 2012-05-10 (×2): qty 9

## 2012-05-10 NOTE — Procedures (Signed)
Patient seen on Hemodialysis. QB 400, UF goal 4.5L Treatment adjusted as needed.  Zetta Bills MD [facility]. Office # [phone] Pager # [phone] 9:14 AM

## 2012-05-10 NOTE — Progress Notes (Signed)
Patient ID: YAMA NIELSON  male  ZOX:[id number]    DOB: Aug 22, 1961    DOA: 05/09/2012  PCP: Michele Mcalpine, MD  Subjective: Events from this morning noted, rapid response at 5 AM with acute respiratory distress, increased rhonchi in bases, placed on NRB mask at 100% O2, placed on first case hemodialysis this morning. Seen patient earlier this morning on hemodialysis, feeling 'a lot better' with breathing, oral dental pain still persists but improving. States did not have any BM today.  Objective: Weight change:   Intake/Output Summary (Last 24 hours) at 05/10/12 1301 Last data filed at 05/10/12 1035  Gross per 24 hour  Intake      0 ml  Output   3978 ml  Net  -3978 ml   Blood pressure 156/77, pulse 67, temperature 97.1 F (36.2 C), temperature source Oral, resp. rate 17, height 5\' 11"  (1.803 m), weight 56.7 kg (125 lb), SpO2 96.00%.  Physical Exam: General: Alert and awake, oriented x3, on Ventimask  HEENT: anicteric sclera, pupils reactive to light and accommodation, EOMI CVS: S1-S2 clear, no murmur rubs or gallops Chest: Decreased breath sounds at the bases otherwise no wheezing Abdomen: soft nontender, nondistended, normal bowel sounds, no organomegaly Extremities: no cyanosis, clubbing or edema noted bilaterally Neuro: Cranial nerves II-XII intact, no focal neurological deficits  Lab Results: Basic Metabolic Panel:  Lab 05/10/12 8119 05/09/12 1437  NA 134* 140  K 6.4* 5.3*  CL 97 102  CO2 18* 20  GLUCOSE 106* 85  BUN 97* 88*  CREATININE 7.78* 7.27*  CALCIUM 8.9 8.8  MG -- --  PHOS 2.6 --   Liver Function Tests:  Lab 05/10/12 0658 05/09/12 1437  AST -- 27  ALT -- 23  ALKPHOS -- 197*  BILITOT -- 0.3  PROT -- 5.9*  ALBUMIN 2.9* 2.8*  CBC:  Lab 05/10/12 0658 05/09/12 2241 05/09/12 1437  WBC 10.7* -- 7.7  NEUTROABS -- -- 4.3  HGB 8.8* 9.1* --  HCT 27.0* 28.0* --  MCV 83.6 -- 84.1  PLT 209 -- 167   Cardiac Enzymes:  Lab 05/05/12 0715 05/04/12 2006  CKTOTAL  16 13  CKMB 1.2 1.6  CKMBINDEX -- --  TROPONINI <0.30 <0.30     Micro Results: Recent Results (from the past 240 hour(s))  MRSA PCR SCREENING     Status: Normal   Collection Time   05/04/12  6:41 PM      Component Value Range Status Comment   MRSA by PCR NEGATIVE  NEGATIVE Final     Studies/Results: Dg Orthopantogram  05/09/2012  *RADIOLOGY REPORT*  Clinical Data: 51 year old male with dental cavities pain and abscess in the left lower second to third molar area.  ORTHOPANTOGRAM/PANORAMIC  Comparison: 11/27/2010.  Findings: Maxillary dentition is stable.  Right mandibular dentition is stable.  Interval progression of left mandible anterior molar dental caries, now severe with involvement of the posterior root and peri apical lucency at the anterior root. Wisdom teeth are absent.  IMPRESSION: Severe left mandible anterior molar dental caries and peri apical lucency.  Original Report Authenticated By: Harley Hallmark, M.D.   Dg Chest 2 View  05/04/2012  *RADIOLOGY REPORT*  Clinical Data: CHF, post dialysis.  Follow-up.  CHEST - 2 VIEW  Comparison: 05/04/2012  Findings: Improving pulmonary edema pattern.  Mild interstitial prominence and left perihilar opacity persists, likely residual edema.  Cannot completely exclude left perihilar pneumonia. Recommend clinical correlation.  Cardiomegaly.  Small left pleural effusion.  IMPRESSION: Improving interstitial and  airspace opacities bilaterally.  Mild interstitial prominence and left perihilar opacity persist, likely edema, but recommend clinical correlation to exclude infection.  Original Report Authenticated By: Cyndie Chime, M.D.    Medications: Scheduled Meds:   . amLODipine  5 mg Oral QHS  . amoxicillin-clavulanate  1 tablet Oral Q12H  . carbamazepine  300 mg Oral BID  . darbepoetin (ARANESP) injection - DIALYSIS  60 mcg Intravenous Q Tue-HD  . diazepam  10 mg Oral TID  . escitalopram  20 mg Oral Daily  . hydrocortisone  25 mg Rectal Q12H    . metoprolol  50 mg Oral BID  . nicotine  21 mg Transdermal Q24H  . pantoprazole  40 mg Oral Daily  . paricalcitol  1 mcg Intravenous Q T,Th,Sa-HD  . pneumococcal 23 valent vaccine  0.5 mL Intramuscular Tomorrow-1000  . saccharomyces boulardii  250 mg Oral BID  . simvastatin  20 mg Oral QHS  . sodium chloride  3 mL Intravenous Q12H  . DISCONTD: amLODipine  5 mg Oral Daily  . DISCONTD: calcium acetate  667 mg Oral TID WC  . DISCONTD: calcium carbonate  2 tablet Oral BID  . DISCONTD: carbamazepine  300 mg Oral BID  . DISCONTD: metoprolol  50 mg Oral BID   Continuous Infusions:    Assessment/Plan: Principal Problem:  .GI bleed : Possibly from internal hemorrhoids however patient also has been taking NSAIDs for his dental pain, no active bleeding  - EGD and colonoscopy in April 2013, patient had required then ligation for internal hemorrhoids were persistent bleeding. - Hemoglobin 8.8 this morning, GI following, recommended continue using anusol suppositories - Continue PPI, no plans for endoscopy as of yet.  Acute respiratory distress: Improving, likely secondary to pulmonary edema,  Improving after the hemodialysis.  Hyperkalemia: Undergoing hemodialysis, renal following  .Dental abscess  - Placed on Augmentin, pain control,  - Orthopantogram with severe left mandible anterior molar dental caries and periapical lucency, patient will need to see dentist or orthodontist in [place].   Marland KitchenHEPATITIS C:  - no varices per previous EGD. No transamnitis, Als phos up at 197   .HYPERCHOLESTEROLEMIA: Continue simvastatin   .HYPERTENSION: Start amlodipine and metoprolol   .GERD: Continue PPI   .RENAL FAILURE, END STAGE: On hemodialysis  - Renal following  .SEIZURE DISORDER: Stable  - Continue Tegretol, patient also on Valium   Nicotine abuse: Placed on nicotine patch   DVT prophylaxis: SCDs   CODE STATUS: Full code  Disposition: Not medically ready   LOS: 1 day    RAI,RIPUDEEP M.D. [facility] 05/10/2012, 1:01 PM Pager: [phone]  If 7PM-7AM, please contact night-coverage [email or web address] Password [email or web address]

## 2012-05-10 NOTE — Progress Notes (Signed)
Called to room by bedside RN with patient complaining of shortness of breath and O2 sats in the 80s on [facility]. Patient placed on NRB at O2 100%, increased rhonchi in bases, clear in upper lobes. Patient is first scheduled case of HD this am and states he often feels this way on the day of HD. PRN breathing treatment given, attending MD notified, will continue to monitor. Advised bedside RN to call with further issues.

## 2012-05-10 NOTE — Progress Notes (Signed)
     [facility] Daily Rounding Note 05/10/2012, 9:28 AM  SUBJECTIVE:       No stools or bleeding since early yesterday.  Some nausea after dialysis this AM  OBJECTIVE:         Vital signs in last 24 hours:    Temp:  [97.1 F (36.2 C)-97.9 F (36.6 C)] 97.1 F (36.2 C) (07/09 0634) Pulse Rate:  [64-74] 65  (07/09 0900) Resp:  [14-22] 16  (07/09 0900) BP: (127-174)/(72-89) 151/78 mmHg (07/09 0900) SpO2:  [87 %-100 %] 93 % (07/09 0730) FiO2 (%):  [40 %] 40 % (07/09 0730) Weight:  [130 lb (58.968 kg)-138 lb 3.7 oz (62.7 kg)] 138 lb 3.7 oz (62.7 kg) (07/09 0634) Last BM Date: 05/04/12 General: looks unwell generally.    Heart: RRR  Chest: clear but decreased BS Abdomen: soft, active BS, NT  Extremities: no pedal edema Neuro/Psych:  Drowsy, lethargic, flat.   Lab Results:  Basename 05/10/12 0658 05/09/12 2241 05/09/12 1437  WBC 10.7* -- 7.7  HGB 8.8* 9.1* 8.0*  HCT 27.0* 28.0* 24.8*  PLT 209 -- 167   BMET  Basename 05/10/12 0658 05/09/12 1437  NA 134* 140  K 6.4* 5.3*  CL 97 102  CO2 18* 20  GLUCOSE 106* 85  BUN 97* 88*  CREATININE 7.78* 7.27*  CALCIUM 8.9 8.8   LFT  Basename 05/10/12 0658 05/09/12 1437  PROT -- 5.9*  ALBUMIN 2.9* 2.8*  AST -- 27  ALT -- 23  ALKPHOS -- 197*  BILITOT -- 0.3  BILIDIR -- --  IBILI -- --   PT/INR No results found for this basename: LABPROT:2,INR:2 in the last 72 hours Hepatitis Panel No results found for this basename: HEPBSAG,HCVAB,HEPAIGM,HEPBIGM in the last 72 hours  Studies/Results: Dg Orthopantogram  05/09/2012  *RADIOLOGY REPORT*  Clinical Data: 51 year old male with dental cavities pain and abscess in the left lower second to third molar area.  ORTHOPANTOGRAM/PANORAMIC  Comparison: 11/27/2010.  Findings: Maxillary dentition is stable.  Right mandibular dentition is stable.  Interval progression of left mandible anterior molar dental caries, now severe with involvement of the posterior root and peri apical lucency at the  anterior root. Wisdom teeth are absent.  IMPRESSION: Severe left mandible anterior molar dental caries and peri apical lucency.  Original Report Authenticated By: Harley Hallmark, M.D.    ASSESMENT: * Hematochezia. Had int rrhoids on 02/2012 colonoscopy. No bleeding in last 18 hours * Anemia, normocytic.  H & H stable.  No transfusions so far.  * ESRD  * Gastritis, compliant with PPI.  * Dental infection, on Amoxicillin at home, Augmentin ordered inpt.  * Hepatitis C positive, fatty liver, cirrhosis, ascities on 12/25/11 CT. No varices or portal htn on EGD in 02/2012.    PLAN: *  Continue the Anusol suppositories for the next few days.  Make sure he has Rx for these to use PRN for recurrent bleeding as outpt.    LOS: 1 day   Jennye Moccasin  05/10/2012, 9:28 AM Pager: [phone]

## 2012-05-10 NOTE — Progress Notes (Signed)
Patient ID: Sean [name], male   DOB: [birth year], 51 y.o.   MRN: [id number]   [facility] Progress Note    Subjective:   Reports no BM overnight (therfore no melena/hematochezia). Oral pain persists.   Objective:   BP 151/78  Pulse 65  Temp 97.1 F (36.2 C) (Oral)  Resp 16  Ht 5\' 11"  (1.803 m)  Wt 62.7 kg (138 lb 3.7 oz)  BMI 19.28 kg/m2  SpO2 93%  Physical Exam: WUJ:[id number] on HD via ventimask (Pt using for comfort and not SOB) AOZ:HYQMV RRR, S1 and S2 with ESM Resp:CTA bilaterally, no rales/rhonchi HQI:ONGE, flat, NT, BS normal Ext:No LE edema  Labs: BMET  Lab 05/10/12 0658 05/09/12 1437 05/05/12 0715 05/04/12 1331  NA 134* 140 134* 135  K 6.4* 5.3* 5.2* 6.2*  CL 97 102 93* 94*  CO2 18* 20 27 20   GLUCOSE 106* 85 82 171*  BUN 97* 88* 48* 103*  CREATININE 7.78* 7.27* 4.70* 8.18*  ALB -- -- -- --  CALCIUM 8.9 8.8 8.7 9.5  PHOS 2.6 -- -- 1.8*   CBC  Lab 05/10/12 0658 05/09/12 2241 05/09/12 1437 05/05/12 0715 05/04/12 1330  WBC 10.7* -- 7.7 7.2 15.9*  NEUTROABS -- -- 4.3 -- --  HGB 8.8* 9.1* 8.0* 9.4* --  HCT 27.0* 28.0* 24.8* 29.0* --  MCV 83.6 -- 84.1 82.4 81.1  PLT 209 -- 167 170 251    Medications:      . amLODipine  5 mg Oral QHS  . amoxicillin-clavulanate  1 tablet Oral Q12H  . carbamazepine  300 mg Oral BID  . darbepoetin (ARANESP) injection - DIALYSIS  60 mcg Intravenous Q Tue-HD  . diazepam  10 mg Oral TID  . escitalopram  20 mg Oral Daily  . hydrocortisone  25 mg Rectal Q12H  . metoprolol  50 mg Oral BID  . nicotine  21 mg Transdermal Q24H  . pantoprazole  40 mg Oral Daily  . paricalcitol  1 mcg Intravenous Q T,Th,Sa-HD  . pneumococcal 23 valent vaccine  0.5 mL Intramuscular Tomorrow-1000  . saccharomyces boulardii  250 mg Oral BID  . simvastatin  20 mg Oral QHS  . sodium chloride  3 mL Intravenous Q12H  . DISCONTD: amLODipine  5 mg Oral Daily  . DISCONTD: calcium acetate  667 mg Oral TID WC  . DISCONTD: calcium  carbonate  2 tablet Oral BID  . DISCONTD: carbamazepine  300 mg Oral BID  . DISCONTD: metoprolol  50 mg Oral BID     Assessment/ Plan:    1. GIB- No bowel movement since yesterday- Hgb/hemodynamically stable overnight- continue on heparin free HD for now. 2. Dental Caries- possible abscess on Augmentin; OPG showed some peri-apical lucency 3. ESRD - TTS [place]; K level 6.4 today- will transiently use 1K bath 4. HTN/Volume- BP fair on HD- encouraged compliance with both HD and OP BP medications (amlodipine/Metoprolol) 5. Anemia - Hgb 8.8(Appears stable since yesterday) ; On Aranesp ( ) with HD   6. Metabolic bone disease - Low phosphorus- off binders 7. 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 Hep C + 9. Tobacco abuse/HLD - on statin therapy; recent smoking cessation; failed chantix (hallucinations); now has nicotine patches; reports s/o early in the past due to cravings for cigarettes  10. Hx seizure d/o- no activity on Tegretol 11. Chronic constipation - miralax daily prn outpt setting; now on Florastor and Anusol  12. Anxiety/depression - on Lexapro; Trazodone qhs prn sleep 13. Hx of dialysis noncompliance - s/o early Saturday despite recent hosp for volume overload after missing HD; various reasons including chronic pain/anxiety and tobacco cravings; stressed compliance   Zetta Bills, MD 05/10/2012, 9:16 AM

## 2012-05-10 NOTE — Progress Notes (Signed)
CRITICAL VALUE ALERT  Critical value received: K+ 6.4  Date of notification:  05/10/12  Time of notification:  0750  Critical value read back:yes  Nurse who received alert:  Hurshel Keys  MD notified (1st page):  Dr Allena Katz  Time of first page:  [phone]  MD notified (2nd page):  Time of second page:  Responding MD:  Dr Allena Katz  Time MD responded:  [phone]

## 2012-05-10 NOTE — Progress Notes (Signed)
I have taken an interval history, reviewed the chart and examined the patient. I agree with the extender's note, impression and recommendations. OP follow up with Dr. Melvia Heaps if needed. We will sign off.  Venita Lick. Russella Dar MD Clementeen Graham

## 2012-05-10 NOTE — Progress Notes (Signed)
Patient complained of feeling short of breath with O2 sat 87% while on 2L nasal canula and he had a dusty complexion. BP 174/89 HR 71. Patient placed on a non-rebreather mask which brought his O2 sat to 100%. Lung sounds very diminished at bases. MD on-call, Rapid Response, and Respiratory paged and responded. Patient then placed on venturi mask with O2 sats 96% and he said the mask helped him feel less short of breath. He is first case HD, so MD advised for him to go promptly to HD. HD paged and made aware of situation. Patient placed on continuous O2 monitor and given breathing treatment. Will continue to monitor patient.  Harless Litten, RN 05/10/12

## 2012-05-11 DIAGNOSIS — N189 Chronic kidney disease, unspecified: Secondary | ICD-10-CM

## 2012-05-11 DIAGNOSIS — D631 Anemia in chronic kidney disease: Secondary | ICD-10-CM

## 2012-05-11 LAB — CBC
Hemoglobin: 9 g/dL — ABNORMAL LOW (ref 13.0–17.0)
MCHC: 31.5 g/dL (ref 30.0–36.0)
Platelets: 203 10*3/uL (ref 150–400)
RBC: 3.36 MIL/uL — ABNORMAL LOW (ref 4.22–5.81)

## 2012-05-11 LAB — BASIC METABOLIC PANEL
BUN: 52 mg/dL — ABNORMAL HIGH (ref 6–23)
Calcium: 8.5 mg/dL (ref 8.4–10.5)
GFR calc Af Amer: 14 mL/min — ABNORMAL LOW (ref >90)
GFR calc non Af Amer: 12 mL/min — ABNORMAL LOW (ref >90)
Potassium: 4.5 mEq/L (ref 3.5–5.1)
Sodium: 137 mEq/L (ref 135–145)

## 2012-05-11 MED ORDER — SODIUM CHLORIDE 0.65 % NA SOLN: 1.0000 | NASAL | Status: DC | PRN

## 2012-05-11 MED ORDER — CLINDAMYCIN HCL 300 MG PO CAPS: 300.0000 mg | ORAL_CAPSULE | Freq: Three times a day (TID) | ORAL | Status: AC

## 2012-05-11 MED ORDER — OXYCODONE-ACETAMINOPHEN 10-325 MG PO TABS: 1.0000 | ORAL_TABLET | ORAL | Status: DC | PRN

## 2012-05-11 MED ORDER — FLUTICASONE PROPIONATE 50 MCG/ACT NA SUSP
1.0000 | Freq: Two times a day (BID) | NASAL | Status: DC
  Filled 2012-05-11: qty 16

## 2012-05-11 MED ORDER — NICOTINE 21 MG/24HR TD PT24: 1.0000 | MEDICATED_PATCH | TRANSDERMAL | Status: DC

## 2012-05-11 MED ORDER — HYDROCORTISONE ACETATE 25 MG RE SUPP: 25.0000 mg | Freq: Two times a day (BID) | RECTAL | Status: DC

## 2012-05-11 MED ORDER — SACCHAROMYCES BOULARDII 250 MG PO CAPS: 250.0000 mg | ORAL_CAPSULE | Freq: Two times a day (BID) | ORAL | Status: AC

## 2012-05-11 MED ORDER — FLUTICASONE PROPIONATE 50 MCG/ACT NA SUSP: 1.0000 | Freq: Two times a day (BID) | NASAL | Status: DC

## 2012-05-11 MED ORDER — DARBEPOETIN ALFA-POLYSORBATE 60 MCG/0.3ML IJ SOLN: 60.0000 ug | INTRAMUSCULAR | Status: DC

## 2012-05-11 MED ORDER — CLINDAMYCIN HCL 300 MG PO CAPS
300.0000 mg | ORAL_CAPSULE | Freq: Three times a day (TID) | ORAL | Status: DC
  Filled 2012-05-11 (×3): qty 1

## 2012-05-11 MED ORDER — HEPARIN SODIUM (PORCINE) 1000 UNIT/ML DIALYSIS
1500.0000 [IU] | INTRAMUSCULAR | Status: DC | PRN
  Filled 2012-05-11: qty 2

## 2012-05-11 NOTE — Progress Notes (Signed)
Pt provided with d/c instructions and education. Pt verbalized understanding. Pt plans to f/u with PCP in one week and knows to call for an appt. Pt verbalized understanding on continuing HD and is aware of HD tomorrow. Pt doesn't want to wait for tegretol to come up from pharmacy and insists on taking home tegretol after d/c. Pt informed of importance of taking this medication right away. No needs at this time. IV removed with tip intact. Heart monitor cleaned and returned to front. Pt is waiting for ride to get here and will d/c to home. Ramond Craver, RN

## 2012-05-11 NOTE — Progress Notes (Signed)
Subjective: No further bleeding.  Complaining of tooth pain.  Objective: Vital signs in last 24 hours: Filed Vitals:   05/10/12 2100 05/10/12 2252 05/11/12 0500 05/11/12 0943  BP: 142/65 155/79 166/82   Pulse: 72 69 73 68  Temp: 98.3 F (36.8 C)  98.9 F (37.2 C)   TempSrc: Oral  Oral   Resp: 18  18   Height:      Weight:      SpO2: 97%  97%    Weight change: -2.268 kg (-5 lb)  Intake/Output Summary (Last 24 hours) at 05/11/12 1146 Last data filed at 05/11/12 0900  Gross per 24 hour  Intake    483 ml  Output      2 ml  Net    481 ml    Physical Exam: General: Awake, Oriented, No acute distress. HEENT: EOMI, left dental caries with some swelling. Neck: Supple CV: S1 and S2 Lungs: Clear to ascultation bilaterally Abdomen: Soft, Nontender, Nondistended, +bowel sounds. Ext: Good pulses. Trace edema.   Lab Results: Basic Metabolic Panel:  Lab 05/11/12 1610 05/10/12 0658 05/09/12 1437 05/05/12 0715 05/04/12 1331  NA 137 134* 140 134* 135  K 4.5 6.4* 5.3* 5.2* 6.2*  CL 95* 97 102 93* 94*  CO2 29 18* 20 27 20   GLUCOSE 99 106* 85 82 171*  BUN 52* 97* 88* 48* 103*  CREATININE 5.15* 7.78* 7.27* 4.70* 8.18*  CALCIUM 8.5 8.9 8.8 8.7 9.5  MG -- -- -- -- --  PHOS -- 2.6 -- -- 1.8*   Liver Function Tests:  Lab 05/10/12 0658 05/09/12 1437 05/04/12 1331  AST -- 27 --  ALT -- 23 --  ALKPHOS -- 197* --  BILITOT -- 0.3 --  PROT -- 5.9* --  ALBUMIN 2.9* 2.8* 3.2*   No results found for this basename: LIPASE:5,AMYLASE:5 in the last 168 hours No results found for this basename: AMMONIA:5 in the last 168 hours CBC:  Lab 05/11/12 0835 05/10/12 0658 05/09/12 2241 05/09/12 1437 05/05/12 0715 05/04/12 1330  WBC 8.0 10.7* -- 7.7 7.2 15.9*  NEUTROABS -- -- -- 4.3 -- --  HGB 9.0* 8.8* 9.1* 8.0* 9.4* --  HCT 28.6* 27.0* 28.0* 24.8* 29.0* --  MCV 85.1 83.6 -- 84.1 82.4 81.1  PLT 203 209 -- 167 170 251   Cardiac Enzymes:  Lab 05/05/12 0715 05/04/12 2006  CKTOTAL 16 13    CKMB 1.2 1.6  CKMBINDEX -- --  TROPONINI <0.30 <0.30   BNP (last 3 results)  Basename 11/15/11 1025  PROBNP >70000.0*   CBG: No results found for this basename: GLUCAP:5 in the last 168 hours No results found for this basename: HGBA1C:5 in the last 72 hours Other Labs: No components found with this basename: POCBNP:3 No results found for this basename: DDIMER:2 in the last 168 hours No results found for this basename: CHOL:2,HDL:2,LDLCALC:2,TRIG:2,CHOLHDL:2,LDLDIRECT:2 in the last 168 hours No results found for this basename: TSH,T4TOTAL,FREET3,T3FREE,FREET4,THYROIDAB in the last 168 hours No results found for this basename: VITAMINB12:2,FOLATE:2,FERRITIN:2,TIBC:2,IRON:2,RETICCTPCT:2 in the last 168 hours  Micro Results: Recent Results (from the past 240 hour(s))  MRSA PCR SCREENING     Status: Normal   Collection Time   05/04/12  6:41 PM      Component Value Range Status Comment   MRSA by PCR NEGATIVE  NEGATIVE Final     Studies/Results: Dg Orthopantogram  05/09/2012  *RADIOLOGY REPORT*  Clinical Data: 51 year old male with dental cavities pain and abscess in the left lower second to third  molar area.  ORTHOPANTOGRAM/PANORAMIC  Comparison: 11/27/2010.  Findings: Maxillary dentition is stable.  Right mandibular dentition is stable.  Interval progression of left mandible anterior molar dental caries, now severe with involvement of the posterior root and peri apical lucency at the anterior root. Wisdom teeth are absent.  IMPRESSION: Severe left mandible anterior molar dental caries and peri apical lucency.  Original Report Authenticated By: Harley Hallmark, M.D.    Medications: I have reviewed the patient's current medications. Scheduled Meds:   . amLODipine  5 mg Oral QHS  . amoxicillin-clavulanate  1 tablet Oral Daily  . carbamazepine  300 mg Oral BID  . darbepoetin (ARANESP) injection - DIALYSIS  60 mcg Intravenous Q Tue-HD  . diazepam  10 mg Oral TID  . escitalopram  20 mg  Oral Daily  . fluticasone  1 spray Each Nare BID  . hydrocortisone  25 mg Rectal Q12H  . metoprolol  50 mg Oral BID  . nicotine  21 mg Transdermal Q24H  . pantoprazole  40 mg Oral Daily  . paricalcitol  1 mcg Intravenous Q T,Th,Sa-HD  . pneumococcal 23 valent vaccine  0.5 mL Intramuscular Tomorrow-1000  . saccharomyces boulardii  250 mg Oral BID  . simvastatin  20 mg Oral QHS  . sodium chloride  3 mL Intravenous Q12H  . DISCONTD: amoxicillin-clavulanate  1 tablet Oral Q12H   Continuous Infusions:  PRN Meds:.sodium chloride, sodium chloride, acetaminophen, acetaminophen, albuterol, ALPRAZolam, alum & mag hydroxide-simeth, calcium carbonate (dosed in mg elemental calcium), camphor-menthol, docusate sodium, feeding supplement (NEPRO CARB STEADY), heparin, heparin, HYDROmorphone (DILAUDID) injection, hydrOXYzine, lidocaine, lidocaine-prilocaine, menthol-cetylpyridinium, ondansetron (ZOFRAN) IV, ondansetron (ZOFRAN) IV, ondansetron, ondansetron, oxyCODONE oxyCODONE-acetaminophen, pentafluoroprop-tetrafluoroeth, sorbitol, traZODone, zolpidem  Assessment/Plan: GI bleed - Possibly from internal hemorrhoids however patient also has been taking NSAIDs for his dental pain, no active bleeding. Continue Anusol suppositories for the next few days and as needed. - EGD and colonoscopy in April 2013, patient had required then ligation for internal hemorrhoids were persistent bleeding.  - Hemoglobin stable. - Continue PPI, no plans for endoscopy as of yet.   Acute respiratory distress Resolved, likely secondary to pulmonary edema, Improving after the hemodialysis.   Hyperkalemia Resolved, with hemodialysis.  Dental abscess  - Placed on Augmentin, which will be transitioned to clindamycin as it is more affordable, pain control. - Orthopantogram with severe left mandible anterior molar dental caries and periapical lucency, patient will need to see dentist or orthodontist in [place].   HEPATITIS C:  -  No varices per previous EGD. No transamnitis, Als phos up at 197   HYPERCHOLESTEROLEMIA Continue simvastatin   HYPERTENSION Started amlodipine and metoprolol, further titration as outpatient.  GERD Continue PPI   RENAL FAILURE, END STAGE On hemodialysis, renal following.  Continues dialysis as scheduled.  SEIZURE DISORDER Stable. Continue Tegretol, patient also on Valium.  Nicotine abuse Placed on nicotine patch.  DVT prophylaxis SCDs   CODE STATUS: Full code   Disposition Discharge patient today.   LOS: 2 days  Sean [name] A, MD 05/11/2012, 11:46 AM

## 2012-05-11 NOTE — Discharge Summary (Addendum)
Physician Discharge Summary  Sean [name] HYQ:[id number] DOB: [birth year] DOA: 05/09/2012  PCP: Michele Mcalpine, MD  Admit date: 05/09/2012 Discharge date: 05/11/2012  Recommendations for Outpatient Follow-up:  1. Followup with your primary care physician in 1 week.  Follow up with your dentist as soon as possible for dental pain/infection.  Discharge Diagnoses:  Principal Problem:  *GI bleed Active Problems:  HEPATITIS C  HYPERCHOLESTEROLEMIA  HYPERTENSION  GERD  RENAL FAILURE, END STAGE  SEIZURE DISORDER  Dental abscess   Discharge Condition: Stable.  Diet recommendation: Renal diet  History of present illness:  On admission "Patient is a 51 year old male with history of end-stage renal disease, on HD Tues-Thurs-Sat, was recently discharged from the hospital on 05/05/2012 after acute respiratory failure secondary to missed hemodialysis. Patient was transferred [facility] ED today for further workup of GI bleed. History was obtained from the patient who stated that he was in his usual baseline health when he was discharged on 05/05/12 but the next day he started having melanotic stools with bright red blood as well for the last 3 days. Patient had endoscopy and colonoscopy in April 2013 and had band band ligation for the internal hemorrhoids. EGD had shown mild gastritis. Patient denied any vomiting however he stated that he was having nausea. Patient has a broken tooth on the left lower molars with severe caries, possible dental abscess and was prescribed analgesic and antibiotic on discharge and he also complains of pain in his left jaw. He has not yet followed up with a dentist in [facility] Course:  GI bleed - Possibly from internal hemorrhoids however patient also has been taking NSAIDs for his dental pain, no active bleeding. Continue Anusol suppositories for the next few days and as needed. - EGD and colonoscopy in April 2013, patient had required then ligation for  internal hemorrhoids were persistent bleeding.  - Hemoglobin stable. - Continue PPI, no plans for endoscopy as of yet.  - Evaluated by GI Dr. Russella Dar.  Acute respiratory distress On 05/10/2012 patient had an acute respiratory failure, likely secondary to pulmonary edema from volume overload, resolved after the hemodialysis.   Hyperkalemia Resolved, with hemodialysis.  Dental abscess  - Placed on Augmentin, which will be transitioned to clindamycin as it is more affordable. Pain control. - Orthopantogram with severe left mandible anterior molar dental caries and periapical lucency, patient will need to see dentist or orthodontist in [place].   HEPATITIS C:  - No varices per previous EGD. No transamnitis, Als phos up at 197   HYPERCHOLESTEROLEMIA Continue simvastatin   HYPERTENSION Started amlodipine and metoprolol, further titration as outpatient.  GERD Continue PPI   RENAL FAILURE, END STAGE On hemodialysis, renal following.  Continues dialysis as scheduled.  SEIZURE DISORDER Stable. Continue Tegretol, patient also on Valium.  Nicotine abuse Placed on nicotine patch.  Anemia Likely due to acute blood loss anemia from GI bleed and also due to chronic kidney disease.  Sinus congestion Started on Flonase and saline nasal sprays.  Procedures:  None  Consultations:  Dr. Allena Katz, Renal  Dr. Russella Dar, GI  Discharge Exam: Filed Vitals:   05/11/12 0943  BP:   Pulse: 68  Temp:   Resp:    Filed Vitals:   05/10/12 2100 05/10/12 2252 05/11/12 0500 05/11/12 0943  BP: 142/65 155/79 166/82   Pulse: 72 69 73 68  Temp: 98.3 F (36.8 C)  98.9 F (37.2 C)   TempSrc: Oral  Oral   Resp: 18  18  Height:      Weight:      SpO2: 97%  97%    Discharge Instructions  Discharge Orders    Future Appointments: Provider: Department: Dept Phone: Center:   09/09/2012 10:00 AM Michele Mcalpine, MD [facility] [phone] None     Future Orders Please Complete By Expires    Diet - low sodium heart healthy      Increase activity slowly      Discharge instructions      Comments:   Followup with NADEL,SCOTT M, MD (PCP) in 1 week. Followup with your dentist as soon as possible for Dental pain/infection.     Medication List  As of 05/11/2012 12:02 PM   STOP taking these medications         amoxicillin 500 MG capsule         TAKE these medications         albuterol 108 (90 BASE) MCG/ACT inhaler   Commonly known as: PROVENTIL HFA;VENTOLIN HFA   Inhale 2 puffs into the lungs every 6 (six) hours as needed. For shortness of breath      ALPRAZolam 1 MG tablet   Commonly known as: XANAX   Take 1 mg by mouth 3 (three) times daily as needed. For anxiety      amLODipine 5 MG tablet   Commonly known as: NORVASC   Take 1 tablet (5 mg total) by mouth daily.      calcium acetate 667 MG capsule   Commonly known as: PHOSLO   Take by mouth 3 (three) times daily with meals.      calcium carbonate 500 MG chewable tablet   Commonly known as: TUMS - dosed in mg elemental calcium   Chew 2 tablets by mouth 2 (two) times daily.      carbamazepine 300 MG 12 hr capsule   Commonly known as: CARBATROL   Take 300 mg by mouth 2 (two) times daily.      clindamycin 300 MG capsule   Commonly known as: CLEOCIN   Take 1 capsule (300 mg total) by mouth every 8 (eight) hours. Antibiotic for dental infection.      darbepoetin 60 MCG/0.3ML Soln   Commonly known as: ARANESP   Inject 0.3 mLs (60 mcg total) into the vein every Tuesday with hemodialysis.      diazepam 10 MG tablet   Commonly known as: VALIUM   Take 1 tablet by mouth Three times a day.      escitalopram 20 MG tablet   Commonly known as: LEXAPRO   Take 20 mg by mouth daily.      fluticasone 50 MCG/ACT nasal spray   Commonly known as: FLONASE   Place 1 spray into the nose 2 (two) times daily.      hydrocortisone 25 MG suppository   Commonly known as: ANUSOL-HC   Place 1 suppository (25 mg total) rectally  every 12 (twelve) hours. Used as scheduled for 2 days, then as needed for any more bleeding.      hydrOXYzine 25 MG tablet   Commonly known as: ATARAX/VISTARIL   Take 25 mg by mouth every 6 (six) hours as needed. For itching      metoprolol 50 MG tablet   Commonly known as: LOPRESSOR   Take 50 mg by mouth 2 (two) times daily.      nicotine 21 mg/24hr patch   Commonly known as: NICODERM CQ - dosed in mg/24 hours   Place 1 patch onto  the skin daily. Do not smoke while on nicotine patch.      oxyCODONE-acetaminophen 10-325 MG per tablet   Commonly known as: PERCOCET   Take 1 tablet by mouth every 4 (four) hours as needed for pain. For headache/pain      pantoprazole 40 MG tablet   Commonly known as: PROTONIX   Take 40 mg by mouth daily.      saccharomyces boulardii 250 MG capsule   Commonly known as: FLORASTOR   Take 1 capsule (250 mg total) by mouth 2 (two) times daily.      simvastatin 20 MG tablet   Commonly known as: ZOCOR   Take 20 mg by mouth at bedtime.      sodium chloride 0.65 % nasal spray   Commonly known as: OCEAN   Place 1-2 sprays into the nose as needed for congestion.      traZODone 50 MG tablet   Commonly known as: DESYREL   Take 1 tablet by mouth at bedtime as needed. For sleep              The results of significant diagnostics from this hospitalization (including imaging, microbiology, ancillary and laboratory) are listed below for reference.    Significant Diagnostic Studies: Dg Orthopantogram  05/09/2012  *RADIOLOGY REPORT*  Clinical Data: 51 year old male with dental cavities pain and abscess in the left lower second to third molar area.  ORTHOPANTOGRAM/PANORAMIC  Comparison: 11/27/2010.  Findings: Maxillary dentition is stable.  Right mandibular dentition is stable.  Interval progression of left mandible anterior molar dental caries, now severe with involvement of the posterior root and peri apical lucency at the anterior root. Wisdom teeth are  absent.  IMPRESSION: Severe left mandible anterior molar dental caries and peri apical lucency.  Original Report Authenticated By: Harley Hallmark, M.D.   Dg Chest 2 View  05/04/2012  *RADIOLOGY REPORT*  Clinical Data: CHF, post dialysis.  Follow-up.  CHEST - 2 VIEW  Comparison: 05/04/2012  Findings: Improving pulmonary edema pattern.  Mild interstitial prominence and left perihilar opacity persists, likely residual edema.  Cannot completely exclude left perihilar pneumonia. Recommend clinical correlation.  Cardiomegaly.  Small left pleural effusion.  IMPRESSION: Improving interstitial and airspace opacities bilaterally.  Mild interstitial prominence and left perihilar opacity persist, likely edema, but recommend clinical correlation to exclude infection.  Original Report Authenticated By: Cyndie Chime, M.D.    Microbiology: Recent Results (from the past 240 hour(s))  MRSA PCR SCREENING     Status: Normal   Collection Time   05/04/12  6:41 PM      Component Value Range Status Comment   MRSA by PCR NEGATIVE  NEGATIVE Final      Labs: Basic Metabolic Panel:  Lab 05/11/12 1610 05/10/12 0658 05/09/12 1437 05/05/12 0715 05/04/12 1331  NA 137 134* 140 134* 135  K 4.5 6.4* 5.3* 5.2* 6.2*  CL 95* 97 102 93* 94*  CO2 29 18* 20 27 20   GLUCOSE 99 106* 85 82 171*  BUN 52* 97* 88* 48* 103*  CREATININE 5.15* 7.78* 7.27* 4.70* 8.18*  CALCIUM 8.5 8.9 8.8 8.7 9.5  MG -- -- -- -- --  PHOS -- 2.6 -- -- 1.8*   Liver Function Tests:  Lab 05/10/12 0658 05/09/12 1437 05/04/12 1331  AST -- 27 --  ALT -- 23 --  ALKPHOS -- 197* --  BILITOT -- 0.3 --  PROT -- 5.9* --  ALBUMIN 2.9* 2.8* 3.2*   No results found for this  basename: LIPASE:5,AMYLASE:5 in the last 168 hours No results found for this basename: AMMONIA:5 in the last 168 hours CBC:  Lab 05/11/12 0835 05/10/12 0658 05/09/12 2241 05/09/12 1437 05/05/12 0715 05/04/12 1330  WBC 8.0 10.7* -- 7.7 7.2 15.9*  NEUTROABS -- -- -- 4.3 -- --  HGB 9.0*  8.8* 9.1* 8.0* 9.4* --  HCT 28.6* 27.0* 28.0* 24.8* 29.0* --  MCV 85.1 83.6 -- 84.1 82.4 81.1  PLT 203 209 -- 167 170 251   Cardiac Enzymes:  Lab 05/05/12 0715 05/04/12 2006  CKTOTAL 16 13  CKMB 1.2 1.6  CKMBINDEX -- --  TROPONINI <0.30 <0.30   BNP: BNP (last 3 results)  Basename 11/15/11 1025  PROBNP >70000.0*   CBG: No results found for this basename: GLUCAP:5 in the last 168 hours  Time coordinating discharge: 25  Signed:  Guy Seese A  [facility] 05/11/2012, 12:02 PM

## 2012-05-11 NOTE — Progress Notes (Signed)
Patient ID: Sean [name], male   DOB: 09-Sep-1961, 51 y.o.   MRN: [id number]   [facility] Progress Note    Subjective:   Reports no further hematochezia/abdominal pain. Complains of sinus pressure/burning pain and occasional epistaxis   Objective:   BP 166/82  Pulse 73  Temp 98.9 F (37.2 C) (Oral)  Resp 18  Ht 5\' 11"  (1.803 m)  Wt 56.7 kg (125 lb)  BMI 17.43 kg/m2  SpO2 97%  Physical Exam: WUJ:WJXBJYNWGNF eating breakfast in bed AOZ:HYQMV RRR, normal S1 and S2  Resp:Rare expiratory wheeze bilaterally, no rales HQI:ONGE, flat, NT, BS normal Ext:No LE edema  Labs: BMET  Lab 05/11/12 0500 05/10/12 0658 05/09/12 1437 05/05/12 0715 05/04/12 1331  NA 137 134* 140 134* 135  K 4.5 6.4* 5.3* 5.2* 6.2*  CL 95* 97 102 93* 94*  CO2 29 18* 20 27 20   GLUCOSE 99 106* 85 82 171*  BUN 52* 97* 88* 48* 103*  CREATININE 5.15* 7.78* 7.27* 4.70* 8.18*  ALB -- -- -- -- --  CALCIUM 8.5 8.9 8.8 8.7 9.5  PHOS -- 2.6 -- -- 1.8*   CBC  Lab 05/10/12 0658 05/09/12 2241 05/09/12 1437 05/05/12 0715 05/04/12 1330  WBC 10.7* -- 7.7 7.2 15.9*  NEUTROABS -- -- 4.3 -- --  HGB 8.8* 9.1* 8.0* 9.4* --  HCT 27.0* 28.0* 24.8* 29.0* --  MCV 83.6 -- 84.1 82.4 81.1  PLT 209 -- 167 170 251    Medications:      . amLODipine  5 mg Oral QHS  . amoxicillin-clavulanate  1 tablet Oral Daily  . carbamazepine  300 mg Oral BID  . darbepoetin (ARANESP) injection - DIALYSIS  60 mcg Intravenous Q Tue-HD  . diazepam  10 mg Oral TID  . escitalopram  20 mg Oral Daily  . hydrocortisone  25 mg Rectal Q12H  . metoprolol  50 mg Oral BID  . nicotine  21 mg Transdermal Q24H  . pantoprazole  40 mg Oral Daily  . paricalcitol  1 mcg Intravenous Q T,Th,Sa-HD  . pneumococcal 23 valent vaccine  0.5 mL Intramuscular Tomorrow-1000  . saccharomyces boulardii  250 mg Oral BID  . simvastatin  20 mg Oral QHS  . sodium chloride  3 mL Intravenous Q12H  . DISCONTD: amoxicillin-clavulanate  1 tablet Oral Q12H      Assessment/ Plan:   1. GIB- Resolved with stool softeners/anusol. Appears was likely from internal hemorrhoids- will restart low dose heparin at HD 2. Dental Caries- possible early abscess and currently on Augmentin; OPG showed some peri-apical lucency 3. ESRD - TTS [place]; will continue HD on schedule 4. HTN/Volume- BP fair on HD- encouraged compliance with both HD and OP BP medications (amlodipine/Metoprolol) 5. Anemia - Hgb 8.8(Appears stable since yesterday) ; On Aranesp ( ) with HD  6. Metabolic bone disease - Low phosphorus- off binders 7. 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 Sinus Pressure/epistaxis: start on flonase 9. Tobacco abuse/HLD -Apparently has quit smoking "cigarettes don't taste the same as before" 10. Hx seizure d/o- no activity on Tegretol 11. Chronic constipation - miralax daily prn outpt setting; now on Florastor and Anusol  12. Anxiety/depression - on Lexapro; Trazodone qhs prn sleep 13. Hx of dialysis noncompliance - s/o early Saturday despite recent hosp for volume overload after missing HD; various reasons including chronic pain/anxiety and tobacco cravings; stressed compliance  From a renal standpoint, appears stable to DC  home today to resume HD at OP unit tomorrow  Zetta Bills, MD 05/11/2012, 8:09 AM

## 2012-05-13 ENCOUNTER — Telehealth: Payer: Self-pay | Admitting: Pulmonary Disease

## 2012-05-13 NOTE — Telephone Encounter (Addendum)
Per SN---it will take a little time for this med to get out of his system.  Continue to stay off of the chantix and HFU scheduled for 7-18 at 2:30.  Pt is aware.

## 2012-05-13 NOTE — Telephone Encounter (Signed)
Called and spoke with pt and he stated that he was just d/c from the hospital and he stated that he has not had the chantix x 4 days but is still having hallucinations (talking to people that are not there) and the pt stated that he is not sure what to do. He stated that they did not start him on any new meds from the hospital except the nasal spray that they sent him home with .  SN please advise. Thanks  Allergies  Allergen Reactions  . Chantix (Varenicline) Other (See Comments)    Hallucination  . Iron Other (See Comments)    Causes pt to bruise easily  . Morphine And Related Nausea And Vomiting

## 2012-05-13 NOTE — Progress Notes (Signed)
Utilization Review Completed.Oswald Pott T7/10/2012   

## 2012-05-19 ENCOUNTER — Ambulatory Visit (INDEPENDENT_AMBULATORY_CARE_PROVIDER_SITE_OTHER): Payer: Medicare Other | Admitting: Pulmonary Disease

## 2012-05-19 ENCOUNTER — Encounter: Payer: Self-pay | Admitting: Pulmonary Disease

## 2012-05-19 VITALS — BP 140/70 | HR 75 | Temp 97.8°F | Ht 71.0 in | Wt 137.0 lb

## 2012-05-19 DIAGNOSIS — D126 Benign neoplasm of colon, unspecified: Secondary | ICD-10-CM

## 2012-05-19 DIAGNOSIS — K219 Gastro-esophageal reflux disease without esophagitis: Secondary | ICD-10-CM

## 2012-05-19 DIAGNOSIS — I10 Essential (primary) hypertension: Secondary | ICD-10-CM

## 2012-05-19 DIAGNOSIS — K5730 Diverticulosis of large intestine without perforation or abscess without bleeding: Secondary | ICD-10-CM

## 2012-05-19 DIAGNOSIS — E7800 Pure hypercholesterolemia, unspecified: Secondary | ICD-10-CM

## 2012-05-19 DIAGNOSIS — J209 Acute bronchitis, unspecified: Secondary | ICD-10-CM

## 2012-05-19 DIAGNOSIS — H547 Unspecified visual loss: Secondary | ICD-10-CM

## 2012-05-19 DIAGNOSIS — B1710 Acute hepatitis C without hepatic coma: Secondary | ICD-10-CM

## 2012-05-19 DIAGNOSIS — K0889 Other specified disorders of teeth and supporting structures: Secondary | ICD-10-CM

## 2012-05-19 DIAGNOSIS — R51 Headache: Secondary | ICD-10-CM

## 2012-05-19 DIAGNOSIS — E039 Hypothyroidism, unspecified: Secondary | ICD-10-CM

## 2012-05-19 DIAGNOSIS — R569 Unspecified convulsions: Secondary | ICD-10-CM

## 2012-05-19 MED ORDER — OXYCODONE-ACETAMINOPHEN 10-325 MG PO TABS: 2.0000 | ORAL_TABLET | Freq: Three times a day (TID) | ORAL | Status: DC | PRN

## 2012-05-19 NOTE — Patient Instructions (Addendum)
Today we updated your med list in our EPIC system...    Continue your current medications the same...  We refilled your PERCOCET to help w/ your severe dental pain...  We will try to arrange appts for treatment at [facility]    Oral Surg clinic for your broken tooth    Hepatitis-C clinic for follow up    Ophthalmology clinic for your decr vision in your left eye.Marland KitchenMarland Kitchen

## 2012-05-20 ENCOUNTER — Inpatient Hospital Stay: Payer: Medicare Other | Admitting: Adult Health

## 2012-05-20 ENCOUNTER — Encounter: Payer: Self-pay | Admitting: Pulmonary Disease

## 2012-05-20 MED ORDER — AMOXICILLIN-POT CLAVULANATE 875-125 MG PO TABS: 1.0000 | ORAL_TABLET | Freq: Two times a day (BID) | ORAL | Status: AC

## 2012-05-20 NOTE — Progress Notes (Signed)
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 needed for congestion.   TRAZODONE (DESYREL) 50 MG TABLET    Take 1 tablet by mouth at bedtime as needed. For sleep  Modified Medications   Modified Medication Previous Medication   OXYCODONE-ACETAMINOPHEN (PERCOCET) 10-325 MG PER TABLET oxyCODONE-acetaminophen (PERCOCET) 10-325 MG per tablet      Take 2 tablets by mouth 3 (three) times daily as needed for pain. For headache/pain    Take 1 tablet by mouth every 4 (four) hours as needed for pain. For headache/pain  Discontinued Medications   DIAZEPAM (VALIUM) 10 MG TABLET    Take 1 tablet by mouth Three times a day.

## 2012-05-23 ENCOUNTER — Telehealth: Payer: Self-pay | Admitting: Pulmonary Disease

## 2012-05-23 MED ORDER — HYDROXYZINE HCL 25 MG PO TABS: 25.0000 mg | ORAL_TABLET | ORAL | Status: DC | PRN

## 2012-05-23 NOTE — Telephone Encounter (Signed)
POt says the Percocet is causing severe itching and he is scratching so bad that he draws blood. He is requesting something different be called in for his tooth pain until he can be seen by a dentist. Percocet added to pt's list of allergies. Pls advise. Allergies  Allergen Reactions  . Chantix (Varenicline) Other (See Comments)    Hallucination  . Iron Other (See Comments)    Causes pt to bruise easily  . Morphine And Related Nausea And Vomiting

## 2012-05-23 NOTE — Telephone Encounter (Signed)
Per sn It is prob. His liver causing the itching, counteract with atarax 25mg  #100 1 tab every 4 hrs as needed for itching

## 2012-05-23 NOTE — Telephone Encounter (Signed)
Pt is aware of SN recs. rx has been sent to the pharmacy

## 2012-05-26 ENCOUNTER — Telehealth: Payer: Self-pay | Admitting: Pulmonary Disease

## 2012-05-26 NOTE — Telephone Encounter (Signed)
Per sn offer otc antihistamine with the atarax and  the itching still does not get better pt will need to be seen---pt aware of sn's recs.

## 2012-05-30 ENCOUNTER — Other Ambulatory Visit: Payer: Self-pay | Admitting: Pulmonary Disease

## 2012-05-30 DIAGNOSIS — B1710 Acute hepatitis C without hepatic coma: Secondary | ICD-10-CM

## 2012-05-30 DIAGNOSIS — N189 Chronic kidney disease, unspecified: Secondary | ICD-10-CM

## 2012-06-05 ENCOUNTER — Observation Stay (HOSPITAL_COMMUNITY)
Admission: EM | Admit: 2012-06-05 | Discharge: 2012-06-07 | Disposition: A | Discharge status: Home / Self Care | Payer: Medicare Other | Source: Other Acute Inpatient Hospital | Attending: Internal Medicine | Admitting: Internal Medicine

## 2012-06-05 DIAGNOSIS — N2581 Secondary hyperparathyroidism of renal origin: Secondary | ICD-10-CM | POA: Insufficient documentation

## 2012-06-05 DIAGNOSIS — K2970 Gastritis, unspecified, without bleeding: Secondary | ICD-10-CM

## 2012-06-05 DIAGNOSIS — J209 Acute bronchitis, unspecified: Secondary | ICD-10-CM

## 2012-06-05 DIAGNOSIS — I10 Essential (primary) hypertension: Secondary | ICD-10-CM | POA: Diagnosis present

## 2012-06-05 DIAGNOSIS — K5730 Diverticulosis of large intestine without perforation or abscess without bleeding: Secondary | ICD-10-CM

## 2012-06-05 DIAGNOSIS — K648 Other hemorrhoids: Secondary | ICD-10-CM

## 2012-06-05 DIAGNOSIS — K922 Gastrointestinal hemorrhage, unspecified: Secondary | ICD-10-CM

## 2012-06-05 DIAGNOSIS — D62 Acute posthemorrhagic anemia: Secondary | ICD-10-CM

## 2012-06-05 DIAGNOSIS — I472 Ventricular tachycardia, unspecified: Secondary | ICD-10-CM

## 2012-06-05 DIAGNOSIS — J9600 Acute respiratory failure, unspecified whether with hypoxia or hypercapnia: Secondary | ICD-10-CM

## 2012-06-05 DIAGNOSIS — B1920 Unspecified viral hepatitis C without hepatic coma: Secondary | ICD-10-CM | POA: Insufficient documentation

## 2012-06-05 DIAGNOSIS — Z91199 Patient's noncompliance with other medical treatment and regimen due to unspecified reason: Secondary | ICD-10-CM

## 2012-06-05 DIAGNOSIS — F3289 Other specified depressive episodes: Secondary | ICD-10-CM

## 2012-06-05 DIAGNOSIS — E039 Hypothyroidism, unspecified: Secondary | ICD-10-CM | POA: Diagnosis present

## 2012-06-05 DIAGNOSIS — B1710 Acute hepatitis C without hepatic coma: Secondary | ICD-10-CM | POA: Diagnosis present

## 2012-06-05 DIAGNOSIS — F17200 Nicotine dependence, unspecified, uncomplicated: Secondary | ICD-10-CM | POA: Diagnosis present

## 2012-06-05 DIAGNOSIS — F329 Major depressive disorder, single episode, unspecified: Secondary | ICD-10-CM

## 2012-06-05 DIAGNOSIS — R1032 Left lower quadrant pain: Secondary | ICD-10-CM

## 2012-06-05 DIAGNOSIS — D126 Benign neoplasm of colon, unspecified: Secondary | ICD-10-CM

## 2012-06-05 DIAGNOSIS — F411 Generalized anxiety disorder: Secondary | ICD-10-CM | POA: Diagnosis present

## 2012-06-05 DIAGNOSIS — R51 Headache: Secondary | ICD-10-CM

## 2012-06-05 DIAGNOSIS — K029 Dental caries, unspecified: Secondary | ICD-10-CM | POA: Diagnosis present

## 2012-06-05 DIAGNOSIS — I509 Heart failure, unspecified: Secondary | ICD-10-CM | POA: Insufficient documentation

## 2012-06-05 DIAGNOSIS — R634 Abnormal weight loss: Secondary | ICD-10-CM

## 2012-06-05 DIAGNOSIS — R0789 Other chest pain: Principal | ICD-10-CM | POA: Diagnosis present

## 2012-06-05 DIAGNOSIS — Z9189 Other specified personal risk factors, not elsewhere classified: Secondary | ICD-10-CM

## 2012-06-05 DIAGNOSIS — D631 Anemia in chronic kidney disease: Secondary | ICD-10-CM | POA: Diagnosis present

## 2012-06-05 DIAGNOSIS — J329 Chronic sinusitis, unspecified: Secondary | ICD-10-CM

## 2012-06-05 DIAGNOSIS — K625 Hemorrhage of anus and rectum: Secondary | ICD-10-CM

## 2012-06-05 DIAGNOSIS — Z992 Dependence on renal dialysis: Secondary | ICD-10-CM | POA: Insufficient documentation

## 2012-06-05 DIAGNOSIS — R569 Unspecified convulsions: Secondary | ICD-10-CM

## 2012-06-05 DIAGNOSIS — R197 Diarrhea, unspecified: Secondary | ICD-10-CM

## 2012-06-05 DIAGNOSIS — F419 Anxiety disorder, unspecified: Secondary | ICD-10-CM

## 2012-06-05 DIAGNOSIS — E7800 Pure hypercholesterolemia, unspecified: Secondary | ICD-10-CM | POA: Diagnosis present

## 2012-06-05 DIAGNOSIS — N186 End stage renal disease: Secondary | ICD-10-CM | POA: Diagnosis present

## 2012-06-05 DIAGNOSIS — K047 Periapical abscess without sinus: Secondary | ICD-10-CM

## 2012-06-05 DIAGNOSIS — I120 Hypertensive chronic kidney disease with stage 5 chronic kidney disease or end stage renal disease: Secondary | ICD-10-CM | POA: Insufficient documentation

## 2012-06-05 DIAGNOSIS — J189 Pneumonia, unspecified organism: Secondary | ICD-10-CM

## 2012-06-05 DIAGNOSIS — K2990 Gastroduodenitis, unspecified, without bleeding: Secondary | ICD-10-CM

## 2012-06-05 DIAGNOSIS — Z9119 Patient's noncompliance with other medical treatment and regimen: Secondary | ICD-10-CM

## 2012-06-05 DIAGNOSIS — N039 Chronic nephritic syndrome with unspecified morphologic changes: Secondary | ICD-10-CM | POA: Insufficient documentation

## 2012-06-05 DIAGNOSIS — G894 Chronic pain syndrome: Secondary | ICD-10-CM | POA: Diagnosis present

## 2012-06-05 DIAGNOSIS — K219 Gastro-esophageal reflux disease without esophagitis: Secondary | ICD-10-CM

## 2012-06-05 MED ORDER — CARBAMAZEPINE ER 100 MG PO TB12
300.0000 mg | ORAL_TABLET | Freq: Two times a day (BID) | ORAL | Status: DC
Start: 2012-06-05 — End: 2012-06-07
  Administered 2012-06-06 – 2012-06-07 (×4): 300 mg via ORAL
  Filled 2012-06-05 (×5): qty 3

## 2012-06-05 MED ORDER — ONDANSETRON HCL 4 MG PO TABS: 4.0000 mg | ORAL_TABLET | Freq: Four times a day (QID) | ORAL | Status: DC | PRN

## 2012-06-05 MED ORDER — HYDROCORTISONE ACETATE 25 MG RE SUPP
25.0000 mg | Freq: Two times a day (BID) | RECTAL | Status: DC
  Administered 2012-06-06 (×2): 25 mg via RECTAL
  Filled 2012-06-05 (×5): qty 1

## 2012-06-05 MED ORDER — SODIUM CHLORIDE 0.9 % IV SOLN: 250.0000 mL | INTRAVENOUS | Status: DC | PRN

## 2012-06-05 MED ORDER — SODIUM CHLORIDE 0.9 % IJ SOLN
3.0000 mL | Freq: Two times a day (BID) | INTRAMUSCULAR | Status: DC
  Administered 2012-06-06: 3 mL via INTRAVENOUS

## 2012-06-05 MED ORDER — ALBUTEROL SULFATE HFA 108 (90 BASE) MCG/ACT IN AERS
2.0000 | INHALATION_SPRAY | RESPIRATORY_TRACT | Status: DC | PRN
  Filled 2012-06-05: qty 6.7

## 2012-06-05 MED ORDER — SODIUM CHLORIDE 0.9 % IJ SOLN: 3.0000 mL | INTRAMUSCULAR | Status: DC | PRN

## 2012-06-05 MED ORDER — FLUTICASONE PROPIONATE 50 MCG/ACT NA SUSP
1.0000 | Freq: Every day | NASAL | Status: DC
  Administered 2012-06-06 – 2012-06-07 (×2): 1 via NASAL
  Filled 2012-06-05 (×2): qty 16

## 2012-06-05 MED ORDER — OXYCODONE HCL 5 MG PO TABS
10.0000 mg | ORAL_TABLET | ORAL | Status: DC | PRN
  Filled 2012-06-05: qty 2

## 2012-06-05 MED ORDER — METOPROLOL TARTRATE 50 MG PO TABS
50.0000 mg | ORAL_TABLET | Freq: Two times a day (BID) | ORAL | Status: DC
Start: 2012-06-05 — End: 2012-06-07
  Administered 2012-06-06 – 2012-06-07 (×4): 50 mg via ORAL
  Filled 2012-06-05 (×5): qty 1

## 2012-06-05 MED ORDER — HYDROMORPHONE HCL PF 1 MG/ML IJ SOLN
1.0000 mg | INTRAMUSCULAR | Status: DC | PRN
  Administered 2012-06-06 – 2012-06-07 (×9): 1 mg via INTRAVENOUS
  Filled 2012-06-05 (×8): qty 1

## 2012-06-05 MED ORDER — OXYCODONE-ACETAMINOPHEN 5-325 MG PO TABS: 2.0000 | ORAL_TABLET | ORAL | Status: DC | PRN

## 2012-06-05 MED ORDER — HYDROXYZINE HCL 25 MG PO TABS: 25.0000 mg | ORAL_TABLET | ORAL | Status: DC | PRN

## 2012-06-05 MED ORDER — ALPRAZOLAM 0.5 MG PO TABS
1.0000 mg | ORAL_TABLET | Freq: Three times a day (TID) | ORAL | Status: DC | PRN
  Administered 2012-06-06: 1 mg via ORAL
  Filled 2012-06-05: qty 2

## 2012-06-05 MED ORDER — CALCIUM ACETATE 667 MG PO CAPS
667.0000 mg | ORAL_CAPSULE | Freq: Three times a day (TID) | ORAL | Status: DC
  Administered 2012-06-06 – 2012-06-07 (×4): 667 mg via ORAL
  Filled 2012-06-05 (×8): qty 1

## 2012-06-05 MED ORDER — CALCIUM CARBONATE ANTACID 500 MG PO CHEW
2.0000 | CHEWABLE_TABLET | Freq: Two times a day (BID) | ORAL | Status: DC
  Administered 2012-06-06 – 2012-06-07 (×4): 400 mg via ORAL
  Filled 2012-06-05 (×5): qty 2

## 2012-06-05 MED ORDER — OXYCODONE-ACETAMINOPHEN 10-325 MG PO TABS: 2.0000 | ORAL_TABLET | ORAL | Status: DC | PRN

## 2012-06-05 MED ORDER — SIMVASTATIN 20 MG PO TABS
20.0000 mg | ORAL_TABLET | Freq: Every day | ORAL | Status: DC
Start: 2012-06-05 — End: 2012-06-07
  Administered 2012-06-06 (×2): 20 mg via ORAL
  Filled 2012-06-05 (×3): qty 1

## 2012-06-05 MED ORDER — SODIUM CHLORIDE 0.9 % IJ SOLN
3.0000 mL | Freq: Two times a day (BID) | INTRAMUSCULAR | Status: DC
  Administered 2012-06-06 – 2012-06-07 (×4): 3 mL via INTRAVENOUS

## 2012-06-05 MED ORDER — HEPARIN SODIUM (PORCINE) 5000 UNIT/ML IJ SOLN
5000.0000 [IU] | Freq: Three times a day (TID) | INTRAMUSCULAR | Status: DC
  Administered 2012-06-06 – 2012-06-07 (×5): 5000 [IU] via SUBCUTANEOUS
  Filled 2012-06-05 (×8): qty 1

## 2012-06-05 MED ORDER — AMLODIPINE BESYLATE 5 MG PO TABS
5.0000 mg | ORAL_TABLET | Freq: Every day | ORAL | Status: DC
  Administered 2012-06-06 – 2012-06-07 (×2): 5 mg via ORAL
  Filled 2012-06-05 (×2): qty 1

## 2012-06-05 MED ORDER — TRAZODONE HCL 50 MG PO TABS
50.0000 mg | ORAL_TABLET | Freq: Every evening | ORAL | Status: DC | PRN
  Filled 2012-06-05: qty 1

## 2012-06-05 MED ORDER — ONDANSETRON HCL 4 MG/2ML IJ SOLN: 4.0000 mg | Freq: Four times a day (QID) | INTRAMUSCULAR | Status: DC | PRN

## 2012-06-05 MED ORDER — ESCITALOPRAM OXALATE 20 MG PO TABS
20.0000 mg | ORAL_TABLET | Freq: Every day | ORAL | Status: DC
  Administered 2012-06-06 – 2012-06-07 (×2): 20 mg via ORAL
  Filled 2012-06-05 (×2): qty 1

## 2012-06-05 MED ORDER — SALINE SPRAY 0.65 % NA SOLN
1.0000 | NASAL | Status: DC | PRN
  Filled 2012-06-05: qty 44

## 2012-06-05 MED ORDER — DOCUSATE SODIUM 100 MG PO CAPS
100.0000 mg | ORAL_CAPSULE | Freq: Two times a day (BID) | ORAL | Status: DC
  Administered 2012-06-06 – 2012-06-07 (×4): 100 mg via ORAL
  Filled 2012-06-05 (×5): qty 1

## 2012-06-05 MED ORDER — PANTOPRAZOLE SODIUM 40 MG PO TBEC
40.0000 mg | DELAYED_RELEASE_TABLET | Freq: Every day | ORAL | Status: DC
  Administered 2012-06-06 – 2012-06-07 (×2): 40 mg via ORAL
  Filled 2012-06-05 (×2): qty 1

## 2012-06-05 MED ORDER — FLORA-Q PO CAPS
1.0000 | ORAL_CAPSULE | Freq: Every day | ORAL | Status: DC
  Administered 2012-06-06 – 2012-06-07 (×2): 1 via ORAL
  Filled 2012-06-05 (×2): qty 1

## 2012-06-05 MED ORDER — DARBEPOETIN ALFA-POLYSORBATE 60 MCG/0.3ML IJ SOLN
60.0000 ug | INTRAMUSCULAR | Status: DC
  Administered 2012-06-07: 60 ug via INTRAVENOUS
  Filled 2012-06-05: qty 0.3

## 2012-06-05 NOTE — Progress Notes (Signed)
11:36 PM   Despite percocet being listed as an allergy the patient currently takes this medication at home. Will continue and monitor for any true reactions.   Janice Coffin

## 2012-06-06 ENCOUNTER — Encounter (HOSPITAL_COMMUNITY): Payer: Self-pay

## 2012-06-06 DIAGNOSIS — R0789 Other chest pain: Principal | ICD-10-CM

## 2012-06-06 DIAGNOSIS — K219 Gastro-esophageal reflux disease without esophagitis: Secondary | ICD-10-CM

## 2012-06-06 DIAGNOSIS — D631 Anemia in chronic kidney disease: Secondary | ICD-10-CM

## 2012-06-06 DIAGNOSIS — K029 Dental caries, unspecified: Secondary | ICD-10-CM

## 2012-06-06 DIAGNOSIS — K047 Periapical abscess without sinus: Secondary | ICD-10-CM

## 2012-06-06 DIAGNOSIS — N189 Chronic kidney disease, unspecified: Secondary | ICD-10-CM

## 2012-06-06 DIAGNOSIS — N186 End stage renal disease: Secondary | ICD-10-CM

## 2012-06-06 DIAGNOSIS — N039 Chronic nephritic syndrome with unspecified morphologic changes: Secondary | ICD-10-CM

## 2012-06-06 LAB — CBC
HCT: 34.3 % — ABNORMAL LOW (ref 39.0–52.0)
HCT: 31 % — ABNORMAL LOW (ref 39.0–52.0)
Hemoglobin: 10.4 g/dL — ABNORMAL LOW (ref 13.0–17.0)
MCH: 27 pg (ref 26.0–34.0)
MCHC: 30.3 g/dL (ref 30.0–36.0)
MCV: 89.1 fL (ref 78.0–100.0)
MCV: 89.6 fL (ref 78.0–100.0)
RBC: 3.46 MIL/uL — ABNORMAL LOW (ref 4.22–5.81)
WBC: 6.8 10*3/uL (ref 4.0–10.5)

## 2012-06-06 LAB — CARDIAC PANEL(CRET KIN+CKTOT+MB+TROPI)
CK, MB: 1.8 ng/mL (ref 0.3–4.0)
Relative Index: INVALID (ref 0.0–2.5)
Relative Index: INVALID (ref 0.0–2.5)
Total CK: 16 U/L (ref 7–232)
Total CK: 12 U/L (ref 7–232)
Total CK: 91 U/L (ref 7–232)
Troponin I: <0.3 ng/mL (ref <0.30)

## 2012-06-06 LAB — BASIC METABOLIC PANEL
CO2: 27 mEq/L (ref 19–32)
Chloride: 98 mEq/L (ref 96–112)
Glucose, Bld: 82 mg/dL (ref 70–99)
Sodium: 141 mEq/L (ref 135–145)

## 2012-06-06 MED ORDER — LIDOCAINE-PRILOCAINE 2.5-2.5 % EX CREA: 1.0000 "application " | TOPICAL_CREAM | CUTANEOUS | Status: DC | PRN

## 2012-06-06 MED ORDER — ALTEPLASE 2 MG IJ SOLR
2.0000 mg | Freq: Once | INTRAMUSCULAR | Status: AC | PRN
  Filled 2012-06-06: qty 2

## 2012-06-06 MED ORDER — PENTAFLUOROPROP-TETRAFLUOROETH EX AERO: 1.0000 "application " | INHALATION_SPRAY | CUTANEOUS | Status: DC | PRN

## 2012-06-06 MED ORDER — LIDOCAINE HCL (PF) 1 % IJ SOLN: 5.0000 mL | INTRAMUSCULAR | Status: DC | PRN

## 2012-06-06 MED ORDER — SODIUM CHLORIDE 0.9 % IV SOLN: 100.0000 mL | INTRAVENOUS | Status: DC | PRN

## 2012-06-06 MED ORDER — HEPARIN SODIUM (PORCINE) 1000 UNIT/ML DIALYSIS
1000.0000 [IU] | INTRAMUSCULAR | Status: DC | PRN
  Filled 2012-06-06: qty 1
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
## 2012-06-06 NOTE — Progress Notes (Signed)
Patient ID: Sean [name]  male  ZOX:[id number]    DOB: 06-27-1961    DOA: 06/05/2012  PCP: Michele Mcalpine, MD  Subjective: States chest pain improved today, thinks possibly it was GERD. Complaining of left dental pain, states has not been able to see a dental surgeon and he needs an extraction per the dentist. He states that Vicodin/norco does not help and wants Dilaudid.  States whenever he eats and at night his left molars hurt  Objective: Weight change:   Intake/Output Summary (Last 24 hours) at 06/06/12 1033 Last data filed at 06/06/12 0823  Gross per 24 hour  Intake    480 ml  Output      0 ml  Net    480 ml   Blood pressure 114/54, pulse 63, temperature 97.7 F (36.5 C), temperature source Oral, resp. rate 18, height 5\' 11"  (1.803 m), weight 59.739 kg (131 lb 11.2 oz), SpO2 100.00%.  Physical Exam: General: Alert and awake, oriented x3, not in any acute distress. HEENT: anicteric sclera, pupils reactive to light and accommodation, EOMI, slight facial swelling on the left side, no tenderness on palpation, dental caries + CVS: S1-S2 clear, no murmur rubs or gallops Chest: clear to auscultation bilaterally, no wheezing, rales or rhonchi Abdomen: soft nontender, nondistended, normal bowel sounds, no organomegaly Extremities: no cyanosis, clubbing or edema noted bilaterally Neuro: Cranial nerves II-XII intact, no focal neurological deficits  Lab Results: Basic Metabolic Panel:  Lab 06/06/12 8119 06/06/12 0009  NA 141 --  K 5.3* --  CL 98 --  CO2 27 --  GLUCOSE 82 --  BUN 68* --  CREATININE 8.87* 8.54*  CALCIUM 8.9 --  MG -- --  PHOS -- --   CBC:  Lab 06/06/12 0500 06/06/12 0009  WBC 6.8 7.2  NEUTROABS -- --  HGB 9.8* 10.4*  HCT 31.0* 34.3*  MCV 89.6 89.1  PLT 194 218   Cardiac Enzymes:  Lab 06/06/12 0500 06/06/12 0008  CKTOTAL 12 16  CKMB 1.7 1.8  CKMBINDEX -- --  TROPONINI <0.30 <0.30    Studies/Results: Dg Orthopantogram  05/09/2012  *RADIOLOGY REPORT*   Clinical Data: 51 year old male with dental cavities pain and abscess in the left lower second to third molar area.  ORTHOPANTOGRAM/PANORAMIC  Comparison: 11/27/2010.  Findings: Maxillary dentition is stable.  Right mandibular dentition is stable.  Interval progression of left mandible anterior molar dental caries, now severe with involvement of the posterior root and peri apical lucency at the anterior root. Wisdom teeth are absent.  IMPRESSION: Severe left mandible anterior molar dental caries and peri apical lucency.  Original Report Authenticated By: Harley Hallmark, M.D.    Medications: Scheduled Meds:   . amLODipine  5 mg Oral Daily  . calcium acetate  667 mg Oral TID WC  . calcium carbonate  2 tablet Oral BID  . carbamazepine  300 mg Oral BID  . darbepoetin  60 mcg Intravenous Q Tue-HD  . docusate sodium  100 mg Oral BID  . escitalopram  20 mg Oral Daily  . Flora-Q  1 capsule Oral Daily  . fluticasone  1 spray Each Nare Daily  . heparin  5,000 Units Subcutaneous Q8H  . hydrocortisone  25 mg Rectal Q12H  . metoprolol  50 mg Oral BID  . pantoprazole  40 mg Oral Daily  . simvastatin  20 mg Oral QHS  . sodium chloride  3 mL Intravenous Q12H  . sodium chloride  3 mL Intravenous Q12H  Continuous Infusions:    Assessment/Plan: Principal Problem:  *Chest pain, atypical: improved, patient thinks it is GERD - cardiac enzymes x 2 negative, ECHO today  -Continue Protonix   Dental caries/ ? abscess: 3rd admission in last 1 month - Orthopantogram done on 05/09/2012 had shown severe left mandible anterior molar dental caries and periapical lucency. - Patient states that he has not been able to see any dental surgeon in [place].  - I called Dr. Leanord Asal' office (dental coverage listed on amion) and unfortunately, no oral dental surgeon coverage is available. I made him an appointment at Dr. Leanord Asal' office for tomorrow at 3 PM.  - Will do hemodialysis first shift tomorrow, DC and arrange  transportation to dental office, coordinated with renal Dr. Kathrene Bongo.  End-stage renal disease on hemodialysis:  - Renal service consulted, hemodialysis, Tues-Thurs-Sat  History of HEPATITIS C   HYPOTHYROIDISM, BORDERLINE   HYPERCHOLESTEROLEMIA: Continue statins   Anemia of chronic kidney failure: Per renal, on Aranesp   CIGARETTE SMOKER   HYPERTENSION: Stable, continue Norvasc and metoprolol    Chronic pain syndrome   history of seizures : Continue Tegretol   DVT Prophylaxis: heparin subcutaneous   Code Status:FULL  Disposition:DC tomorrow after hemodialysis, straight to dental office at 3PM   LOS: 1 day   Teka Chanda M.D. [facility] 06/06/2012, 10:33 AM Pager: [phone]  If 7PM-7AM, please contact night-coverage [email or web address] Password [email or web address]

## 2012-06-06 NOTE — Progress Notes (Signed)
  Echocardiogram 2D Echocardiogram has been performed.  Sean [name] 06/06/2012, 3:50 PM

## 2012-06-06 NOTE — Progress Notes (Signed)
Utilization review complete 

## 2012-06-06 NOTE — Care Management Note (Signed)
    Page 1 of 1   06/06/2012     4:34:36 PM   CARE MANAGEMENT NOTE 06/06/2012  Patient:  Sean [name], Sean [name]   Account Number:  [id number]  Date Initiated:  06/06/2012  Documentation initiated by:  Donn Pierini  Subjective/Objective Assessment:   Pt admitted with chest pain, resp. difficulty, renal failure-     Action/Plan:   PTA pt lived at home   Anticipated DC Date:  06/07/2012   Anticipated DC Plan:  HOME/SELF CARE  In-house referral  Clinical Social Worker         Choice offered to / List presented to:             Status of service:  In process, will continue to follow Medicare Important Message given?   (If response is "NO", the following Medicare IM given date fields will be blank) Date Medicare IM given:   Date Additional Medicare IM given:    Discharge Disposition:    Per UR Regulation:    If discussed at Long Length of Stay Meetings, dates discussed:    Comments:  PCP- NADEL  06/06/12- 1455- Donn Pierini RN, BSN [phone] Plan is to d/c tomorrow after HD, MD has arranged appointment with dentist tomorrow afternoon- pt will need transportation. CSW consulted- Spoke with pt at bedside regarding transportation needs- pt states that he is trying to get in touch with family but has not been able to get anyone yet- he states that he doesn't really have anybody else and his family is not always reliable - encouraged pt to keep trying to arrange transportation and that someone would check with him in the am to see if he was able to arrange a ride or not. CSW aware of potential need.

## 2012-06-07 ENCOUNTER — Encounter (HOSPITAL_COMMUNITY): Payer: Self-pay | Admitting: *Deleted

## 2012-06-07 ENCOUNTER — Observation Stay (HOSPITAL_COMMUNITY): Payer: Medicare Other

## 2012-06-07 ENCOUNTER — Telehealth: Payer: Self-pay | Admitting: Pulmonary Disease

## 2012-06-07 DIAGNOSIS — K922 Gastrointestinal hemorrhage, unspecified: Secondary | ICD-10-CM

## 2012-06-07 DIAGNOSIS — K5900 Constipation, unspecified: Secondary | ICD-10-CM

## 2012-06-07 DIAGNOSIS — J9600 Acute respiratory failure, unspecified whether with hypoxia or hypercapnia: Secondary | ICD-10-CM

## 2012-06-07 DIAGNOSIS — F17200 Nicotine dependence, unspecified, uncomplicated: Secondary | ICD-10-CM

## 2012-06-07 DIAGNOSIS — R1032 Left lower quadrant pain: Secondary | ICD-10-CM

## 2012-06-07 LAB — RENAL FUNCTION PANEL
CO2: 21 mEq/L (ref 19–32)
Calcium: 8.5 mg/dL (ref 8.4–10.5)
Creatinine, Ser: 9.49 mg/dL — ABNORMAL HIGH (ref 0.50–1.35)
GFR calc non Af Amer: 6 mL/min — ABNORMAL LOW (ref >90)

## 2012-06-07 LAB — CBC
MCH: 27.8 pg (ref 26.0–34.0)
MCV: 87 fL (ref 78.0–100.0)
Platelets: 210 10*3/uL (ref 150–400)
RBC: 3.38 MIL/uL — ABNORMAL LOW (ref 4.22–5.81)

## 2012-06-07 MED ORDER — HYDROMORPHONE HCL PF 1 MG/ML IJ SOLN
INTRAMUSCULAR | Status: AC
  Administered 2012-06-07: 1 mg via INTRAVENOUS
  Filled 2012-06-07: qty 1

## 2012-06-07 MED ORDER — HYDROCODONE-ACETAMINOPHEN 5-500 MG PO TABS: 1.0000 | ORAL_TABLET | Freq: Four times a day (QID) | ORAL | Status: AC | PRN

## 2012-06-07 MED ORDER — DARBEPOETIN ALFA-POLYSORBATE 60 MCG/0.3ML IJ SOLN
INTRAMUSCULAR | Status: AC
  Administered 2012-06-07: 60 ug via INTRAVENOUS
  Filled 2012-06-07: qty 0.3

## 2012-06-07 NOTE — Discharge Summary (Signed)
Physician Discharge Summary  Patient ID: Sean [name] MRN: [id number] DOB/AGE: 1961/04/11 51 y.o.  Admit date: 06/05/2012 Discharge date: 06/07/2012  Primary Care Physician:  Michele Mcalpine, MD  Discharge Diagnoses:    .Chest pain, atypical resolved  .HEPATITIS C .HYPOTHYROIDISM, BORDERLINE .HYPERCHOLESTEROLEMIA .CIGARETTE SMOKER .ANXIETY .RENAL FAILURE, END STAGE on hemodialysis  .HYPERTENSION .Anemia of chronic kidney failure .Chronic pain syndrome with pain medication seeking behavior  .Dental caries  Consults: Renal service, Dr. Marjory Sneddon  Discharge Medications: Medication List  As of 06/07/2012 11:55 AM   STOP taking these medications         oxyCODONE-acetaminophen 10-325 MG per tablet         TAKE these medications         albuterol 108 (90 BASE) MCG/ACT inhaler   Commonly known as: PROVENTIL HFA;VENTOLIN HFA   Inhale 2 puffs into the lungs every 6 (six) hours as needed. For shortness of breath      ALPRAZolam 1 MG tablet   Commonly known as: XANAX   Take 1 mg by mouth 3 (three) times daily as needed. For anxiety      amLODipine 5 MG tablet   Commonly known as: NORVASC   Take 1 tablet (5 mg total) by mouth daily.      calcium acetate 667 MG capsule   Commonly known as: PHOSLO   Take by mouth 3 (three) times daily with meals.      calcium carbonate 500 MG chewable tablet   Commonly known as: TUMS - dosed in mg elemental calcium   Chew 2 tablets by mouth 2 (two) times daily.      carbamazepine 300 MG 12 hr capsule   Commonly known as: CARBATROL   Take 300 mg by mouth 2 (two) times daily.      darbepoetin 60 MCG/0.3ML Soln   Commonly known as: ARANESP   Inject 0.3 mLs (60 mcg total) into the vein every Tuesday with hemodialysis.      escitalopram 20 MG tablet   Commonly known as: LEXAPRO   Take 20 mg by mouth daily.      fluticasone 50 MCG/ACT nasal spray   Commonly known as: FLONASE   Place 1 spray into the nose 2 (two) times daily.     HYDROcodone-acetaminophen 5-500 MG per tablet   Commonly known as: VICODIN   Take 1 tablet by mouth every 6 (six) hours as needed for pain.      hydrocortisone 25 MG suppository   Commonly known as: ANUSOL-HC   Place 1 suppository (25 mg total) rectally every 12 (twelve) hours. Used as scheduled for 2 days, then as needed for any more bleeding.      hydrOXYzine 25 MG tablet   Commonly known as: ATARAX/VISTARIL   Take 1 tablet (25 mg total) by mouth every 4 (four) hours as needed. For itching      metoprolol 50 MG tablet   Commonly known as: LOPRESSOR   Take 50 mg by mouth 2 (two) times daily.      simvastatin 20 MG tablet   Commonly known as: ZOCOR   Take 20 mg by mouth at bedtime.      sodium chloride 0.65 % nasal spray   Commonly known as: OCEAN   Place 1-2 sprays into the nose as needed for congestion.      traZODone 50 MG tablet   Commonly known as: DESYREL   Take 1 tablet by mouth at bedtime as needed. For sleep  Brief H and P: For complete details please refer to admission H and P, but in brief Sean [name] is an 51 y.o. male with hx of ESRD on HD (T, Thrs, Sat), chronic pain syndrome on narcotics, Hep C, HTN, active tobacco use, Hypothyrodism, CHF, Sz, Acute cutanea tarda, presents to [facility] with chest pain. His pain was low chest area, nonradiating, not associated with exertion, and no accompanying nausea, vomitting, shortness of breath nor diaphoresis. His EKG showed no ischemic changes, CXR showed no infiltrate, and initial cardia markers were negative. Hospitalist was asked to accept patient in transfer for cardiac r/out.   Hospital Course:  Chest pain, atypical: improved, patient thinks it is GERD, cardiac enzymes remained negative. Patient underwent 2-D echocardiogram which showed EF of 60-65%, grade 2 diastolic dysfunction. During the hospitalization patient had no complaints of chest pain.  Dental caries/ ? abscess: 3rd admission in last 1  month, during hospitalization patient requested for pain medications, mostly for the dental pain and stated that he has been unable to see a dentist in [place]. He asked if he could be on oral Dilaudid outpatient for the dental pain and was denied the request as he needs a more permanent solution. Orthopantogram done on 05/09/2012 had shown severe left mandible anterior molar dental caries and periapical lucency.  Patient has been on 2 courses of antibiotics for his dental caries and possible abscess in the last 1 month. I called Dr. Leanord Asal' office (dental coverage listed on amion) and unfortunately, no oral dental surgeon coverage is available. I made him an appointment at Dr. Leanord Asal' office at 3 PM on 06/07/2012 after discharge from the hospital here. Patient underwent for shift hemodialysis today, coordinated with renal Dr. Kathrene Bongo. Patient was assisted with a consultation to the dental appointment.  End-stage renal disease on hemodialysis: Patient was continued on hemodialysis per his schedule Tues-Thurs-Sat.     Day of Discharge BP 139/62  Pulse 62  Temp 97.5 F (36.4 C) (Oral)  Resp 14  Ht 5\' 11"  (1.803 m)  Wt 59.3 kg (130 lb 11.7 oz)  BMI 18.23 kg/m2  SpO2 100%  Physical Exam: General: Alert and awake oriented x3 not in any acute distress. HEENT: anicteric sclera, pupils reactive to light and accommodation CVS: S1-S2 clear no murmur rubs or gallops Chest: clear to auscultation bilaterally, no wheezing rales or rhonchi Abdomen: soft nontender, nondistended, normal bowel sounds, no organomegaly Extremities: no cyanosis, clubbing or edema noted bilaterally Neuro: Cranial nerves II-XII intact, no focal neurological deficits   The results of significant diagnostics from this hospitalization (including imaging, microbiology, ancillary and laboratory) are listed below for reference.    LAB RESULTS: Basic Metabolic Panel:  Lab 06/07/12 1610 06/06/12 0500  NA 132* 141  K 7.1*  5.3*  CL 93* 98  CO2 21 27  GLUCOSE 97 82  BUN 81* 68*  CREATININE 9.49* 8.87*  CALCIUM 8.5 8.9  MG -- --  PHOS 6.0* --   Liver Function Tests:  Lab 06/07/12 0700  AST --  ALT --  ALKPHOS --  BILITOT --  PROT --  ALBUMIN 2.9*   CBC:  Lab 06/07/12 0700 06/06/12 0500  WBC 8.4 6.8  NEUTROABS -- --  HGB 9.4* 9.8*  HCT 29.4* 31.0*  MCV 87.0 --  PLT 210 194   Cardiac Enzymes:  Lab 06/06/12 1440 06/06/12 0500  CKTOTAL 91 12  CKMB 2.8 1.7  CKMBINDEX -- --  TROPONINI <0.30 <0.30    Significant Diagnostic Studies:  No results found.   Disposition and Follow-up: Discharge Orders    Future Appointments: Provider: Department: Dept Phone: Center:   09/09/2012 10:00 AM Michele Mcalpine, MD [facility] [phone] None     Future Orders Please Complete By Expires   Increase activity slowly          DISPOSITION: Home  DIET: Renal diet ACTIVITY: As tolerated  DISCHARGE FOLLOW-UP Follow-up Information    Follow up with Bradly Chris, DDS on 06/07/2012. (at 3:00 PM)    Contact information:   [address] [place] [address] [phone]       Follow up with NADEL,SCOTT M, MD. Schedule an appointment as soon as possible for a visit in 2 weeks. (for hospital follow-up)    Contact information:   [facility] [address] [place] [address] [phone]          Time spent on Discharge: 35 minutes  Signed:   RAI,RIPUDEEP M.D. [facility] 06/07/2012, 11:55 AM Pager: [phone]  If 7PM-7AM, please contact night-coverage [email or web address] Password [email or web address]

## 2012-06-07 NOTE — Progress Notes (Signed)
AVS reviewed with pt. Teach back method used. Pt able to verbalize understanding of AVS with no questions. IV and tele DC'd. Pt remains stable. Pt to be escorted down to short stay. Sean [name], Sean [name] Randy

## 2012-06-07 NOTE — Procedures (Signed)
Patient was seen on dialysis and the procedure was supervised.  BFR 400  Via AVF BP is  133/71.   Patient appears to be tolerating treatment well  Sean [name] A 06/07/2012

## 2012-06-07 NOTE — Telephone Encounter (Signed)
Called and spoke with joy at Dr. Leanord Asal office.  She is aware that per SN---SN has reviewed the d/c summary from Dr. Kathrene Bongo and the pt did not receive heparin today with HD. Joy is aware and stated that the pt was getting nauseated from the treatment today and they dentist will call him back to have the teeth extracted.  Joy will pass along this info to Dr. Leanord Asal and make him aware that per SN ok to proceed.

## 2012-06-07 NOTE — Progress Notes (Signed)
Subjective:  No complaints on HD.  Is having some oozing from his needles, he thinks is from the subq heparin (is not getting any heparin with HD).  Plan is still for D/C and to go to dentist this PM.  He has a ride ! Objective Vital signs in last 24 hours: Filed Vitals:   06/07/12 0540 06/07/12 0619 06/07/12 0649 06/07/12 0700  BP: 153/90 139/75 128/70 131/72  Pulse: 62 60 60 58  Temp: 97.9 F (36.6 C) 97.4 F (36.3 C)    TempSrc: Oral Oral    Resp: 18 14 11 16   Height:      Weight:  62.8 kg (138 lb 7.2 oz)    SpO2: 100% 99%     Weight change: 3.447 kg (7 lb 9.6 oz)  Intake/Output Summary (Last 24 hours) at 06/07/12 0800 Last data filed at 06/07/12 0540  Gross per 24 hour  Intake   1320 ml  Output      0 ml  Net   1320 ml   Labs: Basic Metabolic Panel:  Lab 06/06/12 1610 06/06/12 0009  NA 141 --  K 5.3* --  CL 98 --  CO2 27 --  GLUCOSE 82 --  BUN 68* --  CREATININE 8.87* 8.54*  CALCIUM 8.9 --  ALB -- --  PHOS -- --   Liver Function Tests: No results found for this basename: AST:3,ALT:3,ALKPHOS:3,BILITOT:3,PROT:3,ALBUMIN:3 in the last 168 hours No results found for this basename: LIPASE:3,AMYLASE:3 in the last 168 hours No results found for this basename: AMMONIA:3 in the last 168 hours CBC:  Lab 06/07/12 0700 06/06/12 0500 06/06/12 0009  WBC 8.4 6.8 7.2  NEUTROABS -- -- --  HGB 9.4* 9.8* 10.4*  HCT 29.4* 31.0* 34.3*  MCV 87.0 89.6 89.1  PLT 210 194 218   Cardiac Enzymes:  Lab 06/06/12 1440 06/06/12 0500 06/06/12 0008  CKTOTAL 91 12 16  CKMB 2.8 1.7 1.8  CKMBINDEX -- -- --  TROPONINI <0.30 <0.30 <0.30   CBG: No results found for this basename: GLUCAP:5 in the last 168 hours  Iron Studies: No results found for this basename: IRON,TIBC,TRANSFERRIN,FERRITIN in the last 72 hours Studies/Results: No results found. Medications: Infusions:    Scheduled Medications:    . amLODipine  5 mg Oral Daily  . calcium acetate  667 mg Oral TID WC  . calcium  carbonate  2 tablet Oral BID  . carbamazepine  300 mg Oral BID  . darbepoetin  60 mcg Intravenous Q Tue-HD  . docusate sodium  100 mg Oral BID  . escitalopram  20 mg Oral Daily  . Flora-Q  1 capsule Oral Daily  . fluticasone  1 spray Each Nare Daily  . heparin  5,000 Units Subcutaneous Q8H  . hydrocortisone  25 mg Rectal Q12H  . metoprolol  50 mg Oral BID  . pantoprazole  40 mg Oral Daily  . simvastatin  20 mg Oral QHS  . sodium chloride  3 mL Intravenous Q12H  . sodium chloride  3 mL Intravenous Q12H    have reviewed scheduled and prn medications.  Physical Exam: General: NAD, on HD Heart: RRR Lungs: mostly clear Abdomen: soft, nontender Extremities: no edema Dialysis Access: L AVF, accessed   I Assessment/ Plan: Pt is a 51 y.o. yo male ESRD who was admitted on 06/05/2012 with  CP (atypical) and has a dental issue  Assessment/Plan: 1. CP- r/o for MI 2. ESRD - regularly TTS in [place] via AVF, no heparin.  Is on  schedule.  Compliance as OP is stressed.  He thinks he will do better if is dental issue is resolved.  3. Anemia- dropping only slightly during hosp, on aranesp.  No iron investigation this admit because was so short 4. Secondary hyperparathyroidism- continue binders- on zemplar only one time per week per protocol at the kidney center 5. HTN/volume- well controlled.   Plan is for discharge today so he can make it to his OP dental appt.  Next HD will be Thursday in [place]  Dock Baccam A   06/07/2012,8:00 AM  LOS: 2 days

## 2012-06-09 LAB — CARBAMAZEPINE, FREE AND TOTAL
Carbamazepine Metabolite -: 3 ug/mL — ABNORMAL HIGH (ref 0.2–2.0)
Carbamazepine, Free: 2 ug/mL (ref 1.0–3.0)
Carbamazepine, Total: 5.9 ug/mL (ref 4.0–12.0)

## 2012-06-14 ENCOUNTER — Telehealth: Payer: Self-pay | Admitting: Pulmonary Disease

## 2012-06-14 MED ORDER — OXYCODONE-ACETAMINOPHEN 10-325 MG PO TABS: 1.0000 | ORAL_TABLET | Freq: Three times a day (TID) | ORAL | Status: AC

## 2012-06-14 NOTE — Telephone Encounter (Signed)
I spoke with pt and he states he had his teeth pulled yesterday and is pain. He is requesting refill on percocet's. I do not see this on pt current med list. Pt was giving Vicodin on 06/07/12 #30 x 0 refills. He stated this is not helping with the pain. Please advise SN thanks

## 2012-06-14 NOTE — Telephone Encounter (Signed)
rx has been signed by SN and i called and spoke with pt and he is aware that this rx is ready and will be left up front to be picked up.  Nothing further needed.

## 2012-06-14 NOTE — Telephone Encounter (Signed)
Per SN---ok to give rx for the percocet 10/325  #90   1 tid prn . This has been printed out for SN to sign.

## 2012-06-27 ENCOUNTER — Telehealth: Payer: Self-pay | Admitting: Pulmonary Disease

## 2012-06-27 NOTE — Telephone Encounter (Signed)
Spoke with pt and given appt with Dr Kriste Basque on 06-30-12.  Pt is to go by [facility] and pick up disc of ct scan to bring to ov.  Pt is requesting med for cough that is causing increased SOB.  Please advise. Allergies  Allergen Reactions  . Chantix (Varenicline) Other (See Comments)    Hallucination  . Percocet (Oxycodone-Acetaminophen) Itching  . Iron Other (See Comments)    Causes pt to bruise easily  . Morphine And Related Nausea And Vomiting

## 2012-06-27 NOTE — Telephone Encounter (Signed)
i have faxed over request to [facility] medical records to get copies of recent er visit and any labs or xrays done at this visit.

## 2012-06-28 ENCOUNTER — Other Ambulatory Visit: Payer: Medicare Other

## 2012-06-28 ENCOUNTER — Ambulatory Visit (INDEPENDENT_AMBULATORY_CARE_PROVIDER_SITE_OTHER): Payer: Medicare Other | Admitting: Pulmonary Disease

## 2012-06-28 ENCOUNTER — Other Ambulatory Visit: Payer: Self-pay | Admitting: Pulmonary Disease

## 2012-06-28 ENCOUNTER — Encounter: Payer: Self-pay | Admitting: Pulmonary Disease

## 2012-06-28 VITALS — BP 146/82 | HR 78 | Temp 97.8°F | Ht 71.0 in | Wt 144.4 lb

## 2012-06-28 DIAGNOSIS — C229 Malignant neoplasm of liver, not specified as primary or secondary: Secondary | ICD-10-CM

## 2012-06-28 DIAGNOSIS — I10 Essential (primary) hypertension: Secondary | ICD-10-CM

## 2012-06-28 DIAGNOSIS — R569 Unspecified convulsions: Secondary | ICD-10-CM

## 2012-06-28 DIAGNOSIS — K769 Liver disease, unspecified: Secondary | ICD-10-CM

## 2012-06-28 DIAGNOSIS — B182 Chronic viral hepatitis C: Secondary | ICD-10-CM | POA: Insufficient documentation

## 2012-06-28 DIAGNOSIS — K5730 Diverticulosis of large intestine without perforation or abscess without bleeding: Secondary | ICD-10-CM

## 2012-06-28 DIAGNOSIS — K7460 Unspecified cirrhosis of liver: Secondary | ICD-10-CM

## 2012-06-28 DIAGNOSIS — B1710 Acute hepatitis C without hepatic coma: Secondary | ICD-10-CM

## 2012-06-28 DIAGNOSIS — K219 Gastro-esophageal reflux disease without esophagitis: Secondary | ICD-10-CM

## 2012-06-28 DIAGNOSIS — F329 Major depressive disorder, single episode, unspecified: Secondary | ICD-10-CM

## 2012-06-28 DIAGNOSIS — J209 Acute bronchitis, unspecified: Secondary | ICD-10-CM

## 2012-06-28 DIAGNOSIS — K029 Dental caries, unspecified: Secondary | ICD-10-CM

## 2012-06-28 DIAGNOSIS — R51 Headache: Secondary | ICD-10-CM

## 2012-06-28 DIAGNOSIS — N186 End stage renal disease: Secondary | ICD-10-CM

## 2012-06-28 DIAGNOSIS — F419 Anxiety disorder, unspecified: Secondary | ICD-10-CM

## 2012-06-28 DIAGNOSIS — K7689 Other specified diseases of liver: Secondary | ICD-10-CM

## 2012-06-28 DIAGNOSIS — F17200 Nicotine dependence, unspecified, uncomplicated: Secondary | ICD-10-CM

## 2012-06-28 MED ORDER — FENTANYL 50 MCG/HR TD PT72: 1.0000 | MEDICATED_PATCH | TRANSDERMAL | Status: DC

## 2012-06-28 MED ORDER — HYDROCODONE-ACETAMINOPHEN 10-325 MG PO TABS: 1.0000 | ORAL_TABLET | Freq: Three times a day (TID) | ORAL | Status: DC | PRN

## 2012-06-28 MED ORDER — FIRST-DUKES MOUTHWASH MT SUSP: OROMUCOSAL | Status: DC

## 2012-06-28 NOTE — Progress Notes (Signed)
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  CHRONIC ANEMIA> He is followed by Sean in [place] & receives Aranesp...   Patient's Medications  New Prescriptions   DIPHENHYD-HYDROCORT-NYSTATIN (FIRST-DUKES MOUTHWASH) SUSP    1 teaspoon gargle and swallow four times daily as needed   FENTANYL (DURAGESIC - DOSED MCG/HR) 50 MCG/HR    Place 1 patch (50 mcg total) onto the skin every 3 (three) days.   HYDROCODONE-ACETAMINOPHEN (NORCO) 10-325 MG PER TABLET    Take 1 tablet by mouth 3 (three) times daily as needed for pain.  Previous Medications   ALBUTEROL (PROVENTIL HFA;VENTOLIN HFA) 108 (90 BASE) MCG/ACT INHALER    Inhale 2 puffs into the lungs every 6 (six) hours as needed. For shortness of breath   ALPRAZOLAM (XANAX) 1 MG TABLET    Take 1 mg by mouth 3 (three) times daily as needed. For anxiety   AMLODIPINE (NORVASC) 10 MG TABLET    Take 10 mg by mouth daily.   CALCIUM ACETATE (PHOSLO) 667 MG CAPSULE    Take by mouth 3 (three) times daily with meals.   CALCIUM CARBONATE (TUMS - DOSED IN MG ELEMENTAL CALCIUM) 500 MG CHEWABLE TABLET    Chew 2 tablets by mouth 2 (two) times daily.    CARBAMAZEPINE (CARBATROL) 300 MG 12 HR  CAPSULE    Take 300 mg by mouth 2 (two) times daily.   DARBEPOETIN (ARANESP) 60 MCG/0.3ML SOLN    Inject 0.3 mLs (60 mcg total) into the vein every Tuesday with hemodialysis.   ESCITALOPRAM (LEXAPRO) 20 MG TABLET    Take 20 mg by mouth daily.   FLUTICASONE (FLONASE) 50 MCG/ACT NASAL SPRAY    Place 1 spray into the nose 2 (two) times daily.   HYDROCORTISONE (ANUSOL-HC) 25 MG SUPPOSITORY    Place 1 suppository (25 mg total) rectally every 12 (twelve) hours. Used as scheduled for 2 days, then as needed for any more bleeding.   HYDROXYZINE (ATARAX/VISTARIL) 25 MG TABLET    Take 1 tablet (25 mg total) by mouth every 4 (four) hours as needed. For itching   METOPROLOL (LOPRESSOR) 50 MG TABLET    Take 50 mg by mouth 2 (two) times daily.   SIMVASTATIN (ZOCOR) 20 MG TABLET  Take 20 mg by mouth at bedtime.   SODIUM CHLORIDE (OCEAN) 0.65 % NASAL SPRAY    Place 1-2 sprays into the nose as needed for congestion.   TRAZODONE (DESYREL) 50 MG TABLET    Take 1 tablet by mouth at bedtime as needed. For sleep  Modified Medications   No medications on file  Discontinued Medications   AMLODIPINE (NORVASC) 5 MG TABLET    Take 1 tablet (5 mg total) by mouth daily.

## 2012-06-28 NOTE — Telephone Encounter (Signed)
I spoke with pt and he c/o of having a lump on both sides of his neck. Throat is very sore, having a hard time swallowing, and is not able to sleep. He is requesting to see SN today. Please advise thanks

## 2012-06-28 NOTE — Telephone Encounter (Signed)
I spoke with pt and is scheduled to come in and see SN today at 3:30. He is aware to go by [facility] and get copy of his CT scan placed in a CD for AN to review at his OV today

## 2012-06-28 NOTE — Telephone Encounter (Signed)
Per SN---we can add pt on today at 3:30 .  We are scheduling pt for a liver biopsy so he will need to go by [facility] and get a cd of his ct scan that was done over the weekend.  thanks

## 2012-06-28 NOTE — Telephone Encounter (Signed)
2nd issue(s):   Pt c/o of two lumps on either side of throat, feels full (as if eating a full meal) & unable to sleep.  Wants to see SN.  Sean [name]

## 2012-06-28 NOTE — Patient Instructions (Addendum)
Today we updated your med list in our EPIC system...    Continue your current medications the same...  For your throat symptoms>    Continue the AUGMENTIN 875mg - one tab twice daily til gone...    Take the OTC mucolytic med called MUCINEX> 2 tabs twice daily 7 drink lots of fluids...    Use the LOZENGES (lemon drops etc) to get the saliva flowing well...    Use the MAGIC MOUTHWASH> one tsp gargle & swallow up to 4 times daily as needed...  For your chronic pain>     Use the DURAGESIC-50 patch> apply one patch & change it every 3d...    Use the [facility] 10-325> one tab up to 3 times daily as needed in addition to the patch med...    If this is not doing the job> let me know so we can refer you to a PAIN CLINIC for treatment...  Your Liver Biopsy is sched for >>     We will call you w/ the results as soon as we hear.Marland KitchenMarland Kitchen

## 2012-06-30 ENCOUNTER — Ambulatory Visit: Payer: Medicare Other | Admitting: Pulmonary Disease

## 2012-06-30 LAB — HEPATITIS C RNA QUANTITATIVE: HCV Quantitative Log: 6.93 {Log} — ABNORMAL HIGH (ref <1.63)

## 2012-07-06 ENCOUNTER — Ambulatory Visit (HOSPITAL_COMMUNITY): Payer: Medicare Other

## 2012-07-07 ENCOUNTER — Encounter (HOSPITAL_COMMUNITY): Payer: Self-pay | Admitting: Pharmacy Technician

## 2012-07-07 ENCOUNTER — Other Ambulatory Visit: Payer: Self-pay | Admitting: Radiology

## 2012-07-09 ENCOUNTER — Inpatient Hospital Stay (HOSPITAL_COMMUNITY): Payer: Medicare Other

## 2012-07-09 ENCOUNTER — Inpatient Hospital Stay (HOSPITAL_COMMUNITY)
Admission: EM | Admit: 2012-07-09 | Discharge: 2012-07-12 | DRG: 189 | Disposition: A | Discharge status: Home / Self Care | Payer: Medicare Other | Source: Other Acute Inpatient Hospital | Attending: Critical Care Medicine | Admitting: Critical Care Medicine

## 2012-07-09 ENCOUNTER — Encounter (HOSPITAL_COMMUNITY): Payer: Self-pay | Admitting: General Practice

## 2012-07-09 DIAGNOSIS — I10 Essential (primary) hypertension: Secondary | ICD-10-CM | POA: Diagnosis present

## 2012-07-09 DIAGNOSIS — E039 Hypothyroidism, unspecified: Secondary | ICD-10-CM | POA: Diagnosis present

## 2012-07-09 DIAGNOSIS — N186 End stage renal disease: Secondary | ICD-10-CM | POA: Diagnosis present

## 2012-07-09 DIAGNOSIS — Z79899 Other long term (current) drug therapy: Secondary | ICD-10-CM

## 2012-07-09 DIAGNOSIS — G8929 Other chronic pain: Secondary | ICD-10-CM | POA: Diagnosis present

## 2012-07-09 DIAGNOSIS — R197 Diarrhea, unspecified: Secondary | ICD-10-CM

## 2012-07-09 DIAGNOSIS — R0789 Other chest pain: Secondary | ICD-10-CM

## 2012-07-09 DIAGNOSIS — Z9119 Patient's noncompliance with other medical treatment and regimen: Secondary | ICD-10-CM

## 2012-07-09 DIAGNOSIS — E8779 Other fluid overload: Secondary | ICD-10-CM | POA: Diagnosis present

## 2012-07-09 DIAGNOSIS — F329 Major depressive disorder, single episode, unspecified: Secondary | ICD-10-CM

## 2012-07-09 DIAGNOSIS — F17200 Nicotine dependence, unspecified, uncomplicated: Secondary | ICD-10-CM | POA: Diagnosis present

## 2012-07-09 DIAGNOSIS — M549 Dorsalgia, unspecified: Secondary | ICD-10-CM | POA: Diagnosis present

## 2012-07-09 DIAGNOSIS — F411 Generalized anxiety disorder: Secondary | ICD-10-CM | POA: Diagnosis present

## 2012-07-09 DIAGNOSIS — J4489 Other specified chronic obstructive pulmonary disease: Secondary | ICD-10-CM | POA: Diagnosis present

## 2012-07-09 DIAGNOSIS — R634 Abnormal weight loss: Secondary | ICD-10-CM

## 2012-07-09 DIAGNOSIS — D631 Anemia in chronic kidney disease: Secondary | ICD-10-CM

## 2012-07-09 DIAGNOSIS — D62 Acute posthemorrhagic anemia: Secondary | ICD-10-CM

## 2012-07-09 DIAGNOSIS — J811 Chronic pulmonary edema: Secondary | ICD-10-CM | POA: Diagnosis present

## 2012-07-09 DIAGNOSIS — R51 Headache: Secondary | ICD-10-CM

## 2012-07-09 DIAGNOSIS — N2581 Secondary hyperparathyroidism of renal origin: Secondary | ICD-10-CM | POA: Diagnosis present

## 2012-07-09 DIAGNOSIS — K219 Gastro-esophageal reflux disease without esophagitis: Secondary | ICD-10-CM

## 2012-07-09 DIAGNOSIS — R569 Unspecified convulsions: Secondary | ICD-10-CM

## 2012-07-09 DIAGNOSIS — J9600 Acute respiratory failure, unspecified whether with hypoxia or hypercapnia: Principal | ICD-10-CM | POA: Diagnosis present

## 2012-07-09 DIAGNOSIS — F3289 Other specified depressive episodes: Secondary | ICD-10-CM | POA: Diagnosis present

## 2012-07-09 DIAGNOSIS — Z992 Dependence on renal dialysis: Secondary | ICD-10-CM

## 2012-07-09 DIAGNOSIS — I120 Hypertensive chronic kidney disease with stage 5 chronic kidney disease or end stage renal disease: Secondary | ICD-10-CM | POA: Diagnosis present

## 2012-07-09 DIAGNOSIS — J449 Chronic obstructive pulmonary disease, unspecified: Secondary | ICD-10-CM | POA: Diagnosis present

## 2012-07-09 DIAGNOSIS — R1032 Left lower quadrant pain: Secondary | ICD-10-CM

## 2012-07-09 DIAGNOSIS — B182 Chronic viral hepatitis C: Secondary | ICD-10-CM

## 2012-07-09 DIAGNOSIS — F419 Anxiety disorder, unspecified: Secondary | ICD-10-CM

## 2012-07-09 DIAGNOSIS — Z9189 Other specified personal risk factors, not elsewhere classified: Secondary | ICD-10-CM

## 2012-07-09 DIAGNOSIS — E7800 Pure hypercholesterolemia, unspecified: Secondary | ICD-10-CM | POA: Diagnosis present

## 2012-07-09 DIAGNOSIS — D649 Anemia, unspecified: Secondary | ICD-10-CM | POA: Diagnosis present

## 2012-07-09 DIAGNOSIS — K769 Liver disease, unspecified: Secondary | ICD-10-CM

## 2012-07-09 DIAGNOSIS — G40909 Epilepsy, unspecified, not intractable, without status epilepticus: Secondary | ICD-10-CM | POA: Diagnosis present

## 2012-07-09 DIAGNOSIS — K7460 Unspecified cirrhosis of liver: Secondary | ICD-10-CM | POA: Diagnosis present

## 2012-07-09 DIAGNOSIS — B1920 Unspecified viral hepatitis C without hepatic coma: Secondary | ICD-10-CM | POA: Diagnosis present

## 2012-07-09 LAB — GLUCOSE, CAPILLARY: Glucose-Capillary: 164 mg/dL — ABNORMAL HIGH (ref 70–99)

## 2012-07-09 LAB — CBC
HCT: 25 % — ABNORMAL LOW (ref 39.0–52.0)
Hemoglobin: 7.7 g/dL — ABNORMAL LOW (ref 13.0–17.0)
MCV: 85 fL (ref 78.0–100.0)
Platelets: 227 10*3/uL (ref 150–400)
RBC: 2.94 MIL/uL — ABNORMAL LOW (ref 4.22–5.81)
WBC: 13.7 10*3/uL — ABNORMAL HIGH (ref 4.0–10.5)

## 2012-07-09 LAB — DIFFERENTIAL
Basophils Relative: 0 % (ref 0–1)
Lymphocytes Relative: 8 % — ABNORMAL LOW (ref 12–46)
Monocytes Absolute: 0.6 10*3/uL (ref 0.1–1.0)
Monocytes Relative: 4 % (ref 3–12)
Neutro Abs: 11.9 10*3/uL — ABNORMAL HIGH (ref 1.7–7.7)

## 2012-07-09 LAB — COMPREHENSIVE METABOLIC PANEL
AST: 15 U/L (ref 0–37)
CO2: 22 mEq/L (ref 19–32)
Chloride: 96 mEq/L (ref 96–112)
Creatinine, Ser: 7.13 mg/dL — ABNORMAL HIGH (ref 0.50–1.35)
GFR calc non Af Amer: 8 mL/min — ABNORMAL LOW (ref >90)
Glucose, Bld: 133 mg/dL — ABNORMAL HIGH (ref 70–99)
Total Bilirubin: 0.4 mg/dL (ref 0.3–1.2)

## 2012-07-09 LAB — TROPONIN I
Troponin I: <0.3 ng/mL (ref <0.30)
Troponin I: <0.3 ng/mL (ref <0.30)
Troponin I: <0.3 ng/mL (ref <0.30)

## 2012-07-09 LAB — APTT: aPTT: 32 seconds (ref 24–37)

## 2012-07-09 LAB — PRO B NATRIURETIC PEPTIDE: Pro B Natriuretic peptide (BNP): >70000 pg/mL — ABNORMAL HIGH (ref 0–125)

## 2012-07-09 MED ORDER — DARBEPOETIN ALFA-POLYSORBATE 200 MCG/0.4ML IJ SOLN
INTRAMUSCULAR | Status: AC
  Administered 2012-07-09: 200 ug via INTRAVENOUS
  Filled 2012-07-09: qty 0.4

## 2012-07-09 MED ORDER — OXYCODONE HCL 5 MG PO TABS
5.0000 mg | ORAL_TABLET | Freq: Four times a day (QID) | ORAL | Status: DC | PRN
  Administered 2012-07-09: 5 mg via ORAL
  Filled 2012-07-09: qty 1

## 2012-07-09 MED ORDER — IPRATROPIUM BROMIDE 0.02 % IN SOLN
0.5000 mg | Freq: Four times a day (QID) | RESPIRATORY_TRACT | Status: DC
  Administered 2012-07-09 – 2012-07-11 (×8): 0.5 mg via RESPIRATORY_TRACT
  Filled 2012-07-09 (×9): qty 2.5

## 2012-07-09 MED ORDER — METOPROLOL TARTRATE 50 MG PO TABS
50.0000 mg | ORAL_TABLET | Freq: Two times a day (BID) | ORAL | Status: DC
  Administered 2012-07-09 – 2012-07-12 (×6): 50 mg via ORAL
  Filled 2012-07-09 (×7): qty 1

## 2012-07-09 MED ORDER — ASPIRIN 300 MG RE SUPP
300.0000 mg | RECTAL | Status: DC
  Filled 2012-07-09: qty 1

## 2012-07-09 MED ORDER — CARBAMAZEPINE 200 MG PO TABS
300.0000 mg | ORAL_TABLET | Freq: Two times a day (BID) | ORAL | Status: DC
  Administered 2012-07-09 – 2012-07-12 (×7): 300 mg via ORAL
  Filled 2012-07-09 (×9): qty 1.5

## 2012-07-09 MED ORDER — PARICALCITOL 5 MCG/ML IV SOLN
INTRAVENOUS | Status: AC
  Administered 2012-07-09: 2 ug via INTRAVENOUS
  Filled 2012-07-09: qty 1

## 2012-07-09 MED ORDER — AMLODIPINE BESYLATE 10 MG PO TABS
10.0000 mg | ORAL_TABLET | Freq: Every day | ORAL | Status: DC
  Administered 2012-07-09 – 2012-07-12 (×4): 10 mg via ORAL
  Filled 2012-07-09 (×4): qty 1

## 2012-07-09 MED ORDER — ASPIRIN 81 MG PO CHEW
324.0000 mg | CHEWABLE_TABLET | ORAL | Status: AC
  Administered 2012-07-09: 324 mg via ORAL
  Filled 2012-07-09: qty 4

## 2012-07-09 MED ORDER — ACETAMINOPHEN 325 MG PO TABS: 650.0000 mg | ORAL_TABLET | ORAL | Status: DC | PRN

## 2012-07-09 MED ORDER — ESCITALOPRAM OXALATE 20 MG PO TABS
20.0000 mg | ORAL_TABLET | Freq: Every day | ORAL | Status: DC
  Administered 2012-07-09 – 2012-07-12 (×4): 20 mg via ORAL
  Filled 2012-07-09 (×4): qty 1

## 2012-07-09 MED ORDER — METOPROLOL TARTRATE 50 MG PO TABS
50.0000 mg | ORAL_TABLET | Freq: Two times a day (BID) | ORAL | Status: DC
  Filled 2012-07-09 (×2): qty 1

## 2012-07-09 MED ORDER — OXYCODONE-ACETAMINOPHEN 5-325 MG PO TABS
2.0000 | ORAL_TABLET | Freq: Once | ORAL | Status: AC
  Administered 2012-07-09: 2 via ORAL

## 2012-07-09 MED ORDER — SODIUM CHLORIDE 0.9 % IV SOLN
INTRAVENOUS | Status: DC
  Administered 2012-07-09: 20 mL/h via INTRAVENOUS

## 2012-07-09 MED ORDER — SIMVASTATIN 20 MG PO TABS
20.0000 mg | ORAL_TABLET | Freq: Every day | ORAL | Status: DC
  Administered 2012-07-09 – 2012-07-11 (×3): 20 mg via ORAL
  Filled 2012-07-09 (×4): qty 1

## 2012-07-09 MED ORDER — SODIUM CHLORIDE 0.9 % IV SOLN: 250.0000 mL | INTRAVENOUS | Status: DC | PRN

## 2012-07-09 MED ORDER — PARICALCITOL 5 MCG/ML IV SOLN: 2.0000 ug | INTRAVENOUS | Status: DC

## 2012-07-09 MED ORDER — ALBUTEROL SULFATE (5 MG/ML) 0.5% IN NEBU: 2.5000 mg | INHALATION_SOLUTION | Freq: Four times a day (QID) | RESPIRATORY_TRACT | Status: DC

## 2012-07-09 MED ORDER — HEPARIN SODIUM (PORCINE) 5000 UNIT/ML IJ SOLN
5000.0000 [IU] | Freq: Three times a day (TID) | INTRAMUSCULAR | Status: DC
  Administered 2012-07-12: 5000 [IU] via SUBCUTANEOUS
  Filled 2012-07-09 (×12): qty 1

## 2012-07-09 MED ORDER — OXYCODONE HCL 5 MG PO TABS
10.0000 mg | ORAL_TABLET | Freq: Four times a day (QID) | ORAL | Status: DC | PRN
  Administered 2012-07-09 – 2012-07-12 (×6): 10 mg via ORAL
  Filled 2012-07-09 (×5): qty 2

## 2012-07-09 MED ORDER — NITROGLYCERIN 5 MG/ML IV SOLN: 2.0000 ug/min | INTRAVENOUS | Status: DC

## 2012-07-09 MED ORDER — BENAZEPRIL HCL 10 MG PO TABS
10.0000 mg | ORAL_TABLET | Freq: Every day | ORAL | Status: DC
  Administered 2012-07-10 – 2012-07-12 (×3): 10 mg via ORAL
  Filled 2012-07-09 (×3): qty 1

## 2012-07-09 MED ORDER — LABETALOL HCL 5 MG/ML IV SOLN
10.0000 mg | INTRAVENOUS | Status: DC | PRN
  Filled 2012-07-09: qty 4

## 2012-07-09 MED ORDER — DARBEPOETIN ALFA-POLYSORBATE 200 MCG/0.4ML IJ SOLN
200.0000 ug | INTRAMUSCULAR | Status: DC
  Administered 2012-07-09: 200 ug via INTRAVENOUS
  Filled 2012-07-09: qty 0.4

## 2012-07-09 MED ORDER — PARICALCITOL 5 MCG/ML IV SOLN
2.0000 ug | INTRAVENOUS | Status: DC
  Administered 2012-07-09 – 2012-07-12 (×2): 2 ug via INTRAVENOUS
  Filled 2012-07-09 (×2): qty 0.4

## 2012-07-09 MED ORDER — ALPRAZOLAM 0.5 MG PO TABS
0.5000 mg | ORAL_TABLET | Freq: Three times a day (TID) | ORAL | Status: DC | PRN
  Administered 2012-07-09 – 2012-07-12 (×8): 0.5 mg via ORAL
  Filled 2012-07-09 (×9): qty 1

## 2012-07-09 MED ORDER — FENTANYL CITRATE 0.05 MG/ML IJ SOLN
25.0000 ug | INTRAMUSCULAR | Status: DC | PRN
  Administered 2012-07-09: 11:00:00 via INTRAVENOUS
  Administered 2012-07-09: 25 ug via INTRAVENOUS
  Filled 2012-07-09 (×2): qty 2

## 2012-07-09 MED ORDER — IPRATROPIUM BROMIDE 0.02 % IN SOLN: 0.5000 mg | RESPIRATORY_TRACT | Status: DC

## 2012-07-09 MED ORDER — BENAZEPRIL HCL 10 MG PO TABS
10.0000 mg | ORAL_TABLET | Freq: Every day | ORAL | Status: DC
  Filled 2012-07-09: qty 1

## 2012-07-09 MED ORDER — ALBUTEROL SULFATE (5 MG/ML) 0.5% IN NEBU
2.5000 mg | INHALATION_SOLUTION | Freq: Four times a day (QID) | RESPIRATORY_TRACT | Status: DC
  Administered 2012-07-09 – 2012-07-11 (×8): 2.5 mg via RESPIRATORY_TRACT
  Filled 2012-07-09 (×9): qty 0.5

## 2012-07-09 MED ORDER — TRAZODONE HCL 50 MG PO TABS
50.0000 mg | ORAL_TABLET | Freq: Every day | ORAL | Status: DC
  Administered 2012-07-09 – 2012-07-11 (×3): 50 mg via ORAL
  Filled 2012-07-09 (×4): qty 1

## 2012-07-09 MED ORDER — TRAMADOL HCL 50 MG PO TABS: 100.0000 mg | ORAL_TABLET | Freq: Four times a day (QID) | ORAL | Status: DC | PRN

## 2012-07-09 MED ORDER — OXYCODONE-ACETAMINOPHEN 5-325 MG PO TABS
ORAL_TABLET | ORAL | Status: AC
  Administered 2012-07-09: 2 via ORAL
  Filled 2012-07-09: qty 2

## 2012-07-09 NOTE — Procedures (Signed)
I was present at this dialysis session. I have reviewed the session itself and made appropriate changes.   Vinson Moselle, MD [employer] 07/09/2012, 4:44 PM

## 2012-07-09 NOTE — Significant Event (Signed)
Pt. Refuses his PAS hose. He understands that this puts him at risk of a deep vein thrombosis which can lead to pulmonary embolism which can kill him. He verbalizes understanding and still refuses to wear them.
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
## 2012-07-09 NOTE — Progress Notes (Signed)
Report called to [place] on 6700  Engineer, manufacturing systems). She had no further questions. Pt.is hemodynamically stable

## 2012-07-09 NOTE — Progress Notes (Signed)
Pt arrived on unit from 2100. Patient alert & oriented. Complain of pain 7 out of 10 in lower back and stated the fentanyl is not working; BP 174/84 HR 86, 98% on room air, MD notified. Steele Berg RN
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
## 2012-07-10 DIAGNOSIS — G8929 Other chronic pain: Secondary | ICD-10-CM

## 2012-07-10 DIAGNOSIS — K7460 Unspecified cirrhosis of liver: Secondary | ICD-10-CM

## 2012-07-10 DIAGNOSIS — B182 Chronic viral hepatitis C: Secondary | ICD-10-CM

## 2012-07-10 DIAGNOSIS — M549 Dorsalgia, unspecified: Secondary | ICD-10-CM

## 2012-07-10 LAB — BASIC METABOLIC PANEL
CO2: 27 mEq/L (ref 19–32)
Calcium: 8.7 mg/dL (ref 8.4–10.5)
Chloride: 95 mEq/L — ABNORMAL LOW (ref 96–112)
Sodium: 135 mEq/L (ref 135–145)

## 2012-07-10 LAB — PHOSPHORUS: Phosphorus: 3.3 mg/dL (ref 2.3–4.6)

## 2012-07-10 LAB — MAGNESIUM: Magnesium: 2.1 mg/dL (ref 1.5–2.5)

## 2012-07-10 LAB — CBC
Platelets: 224 10*3/uL (ref 150–400)
RBC: 2.86 MIL/uL — ABNORMAL LOW (ref 4.22–5.81)
WBC: 8.8 10*3/uL (ref 4.0–10.5)

## 2012-07-10 MED ORDER — HYDROCODONE-ACETAMINOPHEN 10-325 MG PO TABS
1.0000 | ORAL_TABLET | Freq: Three times a day (TID) | ORAL | Status: DC
  Administered 2012-07-10 – 2012-07-12 (×7): 1 via ORAL
  Filled 2012-07-10 (×7): qty 1
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
## 2012-07-10 NOTE — Progress Notes (Signed)
PCCM ADMISSION NOTE  Date of admission: 9/7 HPI: 70 yowm with hypertension induced ESRD, HD days tu/th/sat. Presented to [facility] ED on day of adm with acute resp distress and severe hypertension. No fever, chills, sweats, CP, cough, hemoptysis, N/V/D, abd pain, LE edema or calf tenderness. Initiated on NPPV and transferred to [facility] for further eval and mgmt  OVERNIGHT- Dyspnea largely resolved by dialysis yesterday.  Mainly c/o back pain treated by Dr Nadel(PCP) with Norco 325/10 tid Has hx Hep C cirrhosis, scheduled for liver bx at [facility] on 9/10.  ROS - per HPI  Filed Vitals:   07/09/12 1834 07/09/12 2039 07/09/12 2048 07/10/12 0507  BP: 174/84  153/74 171/85  Pulse: 85  84 82  Temp: 98.3 F (36.8 C)  98.6 F (37 C) 98.3 F (36.8 C)  TempSrc: Oral  Oral Oral  Resp: 15  16 16   Height: 5\' 11"  (1.803 m)     Weight: 63.1 kg (139 lb 1.8 oz)     SpO2: 98% 99% 98% 98%    EXAM:  Gen: chronically ill appearing, NAD on RA Skin- tatoos HEENT: NAD Neck: + JVD Lungs: clear and unlabored, no rales or cough Cardiovascular: RRR , no rub Abdomen: soft, NT, NABS Ext: No edema, AVF in RUE with good thrill Neuro: no focal deficits   DATA: CBC    Component Value Date/Time   WBC 8.8 07/10/2012 0629   RBC 2.86* 07/10/2012 0629   HGB 7.4* 07/10/2012 0629   HCT 24.7* 07/10/2012 0629   PLT 224 07/10/2012 0629   MCV 86.4 07/10/2012 0629   MCH 25.9* 07/10/2012 0629   MCHC 30.0 07/10/2012 0629   RDW 16.7* 07/10/2012 0629   LYMPHSABS 1.1 07/09/2012 0940   MONOABS 0.6 07/09/2012 0940   EOSABS 0.0 07/09/2012 0940   BASOSABS 0.0 07/09/2012 0940    BMET    Component Value Date/Time   NA 135 07/10/2012 0629   K 3.7 07/10/2012 0629   CL 95* 07/10/2012 0629   CO2 27 07/10/2012 0629   GLUCOSE 111* 07/10/2012 0629   BUN 30* 07/10/2012 0629   CREATININE 3.93* 07/10/2012 0629   CALCIUM 8.7 07/10/2012 0629   CALCIUM 7.2* 03/03/2010 1023   GFRNONAA 16* 07/10/2012 0629   GFRAA 19* 07/10/2012 0629    EKG: NSR, no ischemic  changes  RADIOLOGY: Pulm edema pattern  IMPRESSION:   Principal Problem:  *Acute respiratory failure Active Problems:  HYPOTHYROIDISM, BORDERLINE  HYPERTENSION  RENAL FAILURE, END STAGE  Cirrhosis of liver due to hepatitis C  Pulmonary edema  Smoker Back pain, chronic   Suspect primary problem is due to hypertension induced pulmonary edema HD days - TTS  PLAN:  D/C'd NPPV - comfortable on room air.   P-CXR in AM Cont outpt regimen of antihypertensives Add NTG gtt to maintain SBP 120-150 mmHg Empiric BDs No indication for abx or systemic steroids Renal consult requested - HD today  Norco for back pain per outpatient regimen. Note hx pruritus with narcotics, but tolerates this. Tomorrow- can transfer to [facility] expecting dialysis TTS and scheduled liver bx at [facility] on 9/10    CD Amarylis Rovito, MD PCCM

## 2012-07-11 ENCOUNTER — Ambulatory Visit (HOSPITAL_COMMUNITY): Admission: RE | Admit: 2012-07-11 | Payer: Medicare Other | Source: Ambulatory Visit

## 2012-07-11 ENCOUNTER — Inpatient Hospital Stay (HOSPITAL_COMMUNITY): Payer: Medicare Other

## 2012-07-11 DIAGNOSIS — K7460 Unspecified cirrhosis of liver: Secondary | ICD-10-CM

## 2012-07-11 DIAGNOSIS — B182 Chronic viral hepatitis C: Secondary | ICD-10-CM

## 2012-07-11 DIAGNOSIS — J811 Chronic pulmonary edema: Secondary | ICD-10-CM

## 2012-07-11 DIAGNOSIS — N186 End stage renal disease: Secondary | ICD-10-CM

## 2012-07-11 DIAGNOSIS — J9600 Acute respiratory failure, unspecified whether with hypoxia or hypercapnia: Secondary | ICD-10-CM

## 2012-07-11 LAB — APTT: aPTT: 32 seconds (ref 24–37)

## 2012-07-11 LAB — CBC
Platelets: 241 10*3/uL (ref 150–400)
RBC: 3.11 MIL/uL — ABNORMAL LOW (ref 4.22–5.81)
RDW: 16.6 % — ABNORMAL HIGH (ref 11.5–15.5)
WBC: 7.3 10*3/uL (ref 4.0–10.5)

## 2012-07-11 LAB — PROTIME-INR: INR: 1.07 (ref 0.00–1.49)

## 2012-07-11 LAB — RENAL FUNCTION PANEL
Albumin: 3 g/dL — ABNORMAL LOW (ref 3.5–5.2)
BUN: 55 mg/dL — ABNORMAL HIGH (ref 6–23)
CO2: 24 mEq/L (ref 19–32)
Calcium: 8.5 mg/dL (ref 8.4–10.5)
Chloride: 87 mEq/L — ABNORMAL LOW (ref 96–112)
Creatinine, Ser: 5.91 mg/dL — ABNORMAL HIGH (ref 0.50–1.35)
GFR calc Af Amer: 12 mL/min — ABNORMAL LOW (ref >90)
GFR calc non Af Amer: 10 mL/min — ABNORMAL LOW (ref >90)
Glucose, Bld: 154 mg/dL — ABNORMAL HIGH (ref 70–99)
Phosphorus: 5.5 mg/dL — ABNORMAL HIGH (ref 2.3–4.6)
Potassium: 4.8 mEq/L (ref 3.5–5.1)
Sodium: 128 mEq/L — ABNORMAL LOW (ref 135–145)

## 2012-07-11 MED ORDER — LIDOCAINE 5 % EX PTCH
1.0000 | MEDICATED_PATCH | CUTANEOUS | Status: DC
  Administered 2012-07-11: 1 via TRANSDERMAL
  Filled 2012-07-11 (×2): qty 1

## 2012-07-11 MED ORDER — ALBUTEROL SULFATE (5 MG/ML) 0.5% IN NEBU: 2.5000 mg | INHALATION_SOLUTION | RESPIRATORY_TRACT | Status: DC | PRN

## 2012-07-11 MED ORDER — LIDOCAINE 5 % EX PTCH: 1.0000 | MEDICATED_PATCH | CUTANEOUS | Status: DC

## 2012-07-11 MED ORDER — SODIUM CHLORIDE 0.9 % IV SOLN: INTRAVENOUS | Status: DC
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
## 2012-07-11 NOTE — Progress Notes (Signed)
Patient: Sean [name] DOB: 10-17-1961 Date of Admission: 07/09/2012          [place] PCCM    HPI: 51 yo male smoker presented to [facility] 9/07 with acute respiratory distress transiently needing NIPPV and severe HTN.  Found to have pulmonary edema.  Hx of ESRD on HD.  Transferred to [facility] for further therapy 9/07. Respiratory status improved with HD. PMHx Chronic back pain, Hep C, Hypothyroidism, GERD, Anxiety/depression, COPD   Subjective: C/o mild SOB this am.  Mostly c/o chronic back pain.  Difficult to get him to discuss anything but pain rx.  Says norco no longer working. He wants percocet or something IV.     Filed Vitals:   07/10/12 2133 07/10/12 2227 07/11/12 0542 07/11/12 0852  BP:  140/56 140/80   Pulse:  76 70 66  Temp:  98 F (36.7 C) 98.2 F (36.8 C)   TempSrc:  Oral Oral   Resp:  16 16 18   Height:      Weight:  146 lb (66.225 kg)    SpO2: 100% 100% 100% 100%  on RA  EXAM:  Gen: chronically ill appearing, NAD on RA Skin- tatoos HEENT: NAD Neck: + mild JVD Lungs: clear and unlabored, + bibasilar rales  Cardiovascular: RRR , no rub Abdomen: soft, NT, NABS Ext: No edema, AVF in RUE with good thrill Neuro: no focal deficits   DATA: CBC Lab Results  Component Value Date   WBC 7.3 07/11/2012   HGB 8.2* 07/11/2012   HCT 26.5* 07/11/2012   MCV 85.2 07/11/2012   PLT 241 07/11/2012     BMET Lab Results  Component Value Date   CREATININE 3.93* 07/10/2012   BUN 30* 07/10/2012   NA 135 07/10/2012   K 3.7 07/10/2012   CL 95* 07/10/2012   CO2 27 07/10/2012    RADIOLOGY:  Dg Chest Port 1 View  07/11/2012  *RADIOLOGY REPORT*  Clinical Data: Pulmonary edema  PORTABLE CHEST - 1 VIEW  Comparison: 07/09/2012  Findings: Cardiomegaly again noted.  Persistent residual mild interstitial edema with slight improvement in aeration.  No segmental infiltrate.  IMPRESSION: No segmental infiltrate.  Persistent residual mild interstitial edema with slight improvement in aeration.   Cardiomegaly again noted.   Original Report Authenticated By: Natasha Mead, M.D.      IMPRESSION:    1. Acute respiratory failure r/t hypertension induced pulmonary edema - much improved with HD PLAN  -  F/u CXR as needed Cont outpt regimen of antihypertensives Cont norco as outpt regimen for chronic back pain - avoid oversedation  No indication abx or steroids at this time Smoking cessation   2. ESRD - TTS HD PLAN -  HD per renal - ? Need additional HD today as he remains volume overloaded by exam  Volume removal as tol  Cont home antiHTN - now off nitro gtt   3. Hep C Cirrhosis - for liver bx 9/10  Can have liver biopsy rescheduled as outpt  4. Chronic pain -  PLAN -  Cont norco for now Will add lidocaine patch   WHITEHEART,KATHRYN, NP 07/11/2012  9:11 AM Pager: [phone] or [phone]  Reviewed above, examined pt, and agree with assessment/plan.  Respiratory status much improved with HD.  For another round of HD today.  Likely will be able to d/c home in AM 9/10.  Will need to reschedule liver biopsy as outpt when more stable.  Coralyn Helling, MD [facility] Pulmonary/Critical Care 07/11/2012, 1:45 PM  Pager:  [phone] After 3pm call: [phone]

## 2012-07-11 NOTE — Progress Notes (Signed)
Noted that patient was admitted - actually was scheduled for needle core biopsy of his hepatic lesion today 07/11/12 which was ordered by his primary MD, Dr. Kriste Basque.   If to be done as an inpatient - please clarify he is stable to proceed as an inpatient and order for the CT guided hepatic lesion biopsy must be placed as an inpatient. Will check chart later today and follow through accordingly

## 2012-07-12 ENCOUNTER — Inpatient Hospital Stay (HOSPITAL_COMMUNITY): Payer: Medicare Other

## 2012-07-12 ENCOUNTER — Other Ambulatory Visit: Payer: Self-pay | Admitting: Pulmonary Disease

## 2012-07-12 LAB — CBC
Hemoglobin: 7.5 g/dL — ABNORMAL LOW (ref 13.0–17.0)
Platelets: 209 10*3/uL (ref 150–400)
RBC: 2.87 MIL/uL — ABNORMAL LOW (ref 4.22–5.81)
WBC: 6 10*3/uL (ref 4.0–10.5)

## 2012-07-12 LAB — BASIC METABOLIC PANEL
GFR calc Af Amer: 22 mL/min — ABNORMAL LOW (ref >90)
GFR calc non Af Amer: 19 mL/min — ABNORMAL LOW (ref >90)
Glucose, Bld: 99 mg/dL (ref 70–99)
Potassium: 3.7 mEq/L (ref 3.5–5.1)
Sodium: 133 mEq/L — ABNORMAL LOW (ref 135–145)

## 2012-07-12 MED ORDER — HYDROCODONE-ACETAMINOPHEN 10-325 MG PO TABS: 1.0000 | ORAL_TABLET | Freq: Three times a day (TID) | ORAL | Status: DC

## 2012-07-12 MED ORDER — HYDROCODONE-ACETAMINOPHEN 5-325 MG PO TABS
ORAL_TABLET | ORAL | Status: AC
  Administered 2012-07-12: 2
  Filled 2012-07-12: qty 2

## 2012-07-12 MED ORDER — PARICALCITOL 5 MCG/ML IV SOLN
INTRAVENOUS | Status: AC
  Administered 2012-07-12: 2 ug via INTRAVENOUS
  Filled 2012-07-12: qty 1

## 2012-07-12 MED ORDER — ESCITALOPRAM OXALATE 20 MG PO TABS: 20.0000 mg | ORAL_TABLET | Freq: Every day | ORAL | Status: DC

## 2012-07-12 MED ORDER — OXYCODONE HCL 5 MG PO TABS
ORAL_TABLET | ORAL | Status: AC
  Filled 2012-07-12: qty 2

## 2012-07-12 MED ORDER — OXYCODONE HCL 10 MG PO TABS: 10.0000 mg | ORAL_TABLET | Freq: Four times a day (QID) | ORAL | Status: DC | PRN

## 2012-07-12 NOTE — Progress Notes (Signed)
Off unit.

## 2012-07-12 NOTE — Discharge Summary (Signed)
Physician Discharge Summary  Patient ID: Sean [name] MRN: [id number] DOB/AGE: 06/05/1961 51 y.o.  Admit date: 07/09/2012 Discharge date: 07/12/2012  Problem List Principal Problem:  *Acute respiratory failure Active Problems:  HYPOTHYROIDISM, BORDERLINE  HYPERTENSION  RENAL FAILURE, END STAGE  Cirrhosis of liver due to hepatitis C  Pulmonary edema  Smoker  Back pain, chronic  HPI: 51 yowm with hypertension induced ESRD, HD days tu/th/sat. Presented to [facility] ED on day of adm with acute resp distress and severe hypertension. No fever, chills, sweats, CP, cough, hemoptysis, N/V/D, abd pain, LE edema or calf tenderness. Initiated on NPPV and transferred to [facility] for further eval and mgmt  Hospital Course:  EXAM:  Gen: chronically ill appearing, NAD on RA. Currently in HD.  Skin- tatoos  HEENT: NAD  Neck: + mild JVD  Lungs: clear and unlabored, + bibasilar rales  Cardiovascular: RRR , no rub  Abdomen: soft, NT, NABS  Ext: No edema, AVF in RUE with good thrill  Neuro: no focal deficits  DATA:  CBC  Lab Results   Component  Value  Date    WBC  6.0  07/12/2012    HGB  7.5*  07/12/2012    HCT  24.4*  07/12/2012    MCV  85.0  07/12/2012    PLT  209  07/12/2012    BMET  Lab Results   Component  Value  Date    CREATININE  3.41*  07/12/2012    BUN  26*  07/12/2012    NA  133*  07/12/2012    K  3.7  07/12/2012    CL  94*  07/12/2012    CO2  27  07/12/2012     RADIOLOGY:  Dg Chest Port 1 View  07/11/2012 *RADIOLOGY REPORT* Clinical Data: Pulmonary edema PORTABLE CHEST - 1 VIEW Comparison: 07/09/2012 Findings: Cardiomegaly again noted. Persistent residual mild interstitial edema with slight improvement in aeration. No segmental infiltrate. IMPRESSION: No segmental infiltrate. Persistent residual mild interstitial edema with slight improvement in aeration. Cardiomegaly again noted. Original Report Authenticated By: Natasha Mead, M.D.   IMPRESSION:  1. Acute respiratory failure r/t  hypertension induced pulmonary edema - much improved with HD  PLAN -  F/u CXR as needed  Cont outpt regimen of antihypertensives  Cont norco as outpt regimen for chronic back pain resume Duragesic on dc  No indication abx or steroids at this time  Smoking cessation  2. ESRD - TTS HD  PLAN -  HD per renal - Need additional HD 9/10 . Normal HD is T TH Sat in [place]  Volume removal as tol  Cont home antiHTN - now off nitro gtt  3. Hep C Cirrhosis - for liver bx 9/10  Can have liver biopsy rescheduled as outpt  [facility] radiology will call to reschedule on 9/11. Spoke with radiology 9/10 SM.  4. Chronic pain -  PLAN -  Cont norco dc duragesic.  Global:  DC home this PM 9/10  Resume HD as before at [facility].  IR will call and reschedule [place] BX of liver.  Follow up as Opt with     Labs at discharge Lab Results  Component Value Date   CREATININE 3.41* 07/12/2012   BUN 26* 07/12/2012   NA 133* 07/12/2012   K 3.7 07/12/2012   CL 94* 07/12/2012   CO2 27 07/12/2012   Lab Results  Component Value Date   WBC 6.0 07/12/2012   HGB 7.5* 07/12/2012   HCT 24.4* 07/12/2012  MCV 85.0 07/12/2012   PLT 209 07/12/2012   Lab Results  Component Value Date   ALT 10 07/09/2012   AST 15 07/09/2012   ALKPHOS 134* 07/09/2012   BILITOT 0.4 07/09/2012   Lab Results  Component Value Date   INR 1.07 07/11/2012   INR 1.11 07/09/2012   INR 1.07 02/17/2011    Current radiology studies Dg Chest Port 1 View  07/11/2012  *RADIOLOGY REPORT*  Clinical Data: Pulmonary edema  PORTABLE CHEST - 1 VIEW  Comparison: 07/09/2012  Findings: Cardiomegaly again noted.  Persistent residual mild interstitial edema with slight improvement in aeration.  No segmental infiltrate.  IMPRESSION: No segmental infiltrate.  Persistent residual mild interstitial edema with slight improvement in aeration.  Cardiomegaly again noted.   Original Report Authenticated By: Natasha Mead, M.D.     Disposition:  01-Home or Self Care  Discharge Orders     Future Appointments: Provider: Department: Dept Phone: Center:   07/18/2012 2:00 PM [facility] [phone] [facility]   07/22/2012 2:00 PM Michele Mcalpine, MD [facility] [phone] None   09/09/2012 10:00 AM Michele Mcalpine, MD [facility] [phone] None     Future Orders Please Complete By Expires   Discharge patient        Medication List  As of 07/12/2012  1:02 PM   STOP taking these medications         amoxicillin-clavulanate 875-125 MG per tablet      fentaNYL 50 MCG/HR         TAKE these medications         albuterol 108 (90 BASE) MCG/ACT inhaler   Commonly known as: PROVENTIL HFA;VENTOLIN HFA   Inhale 2 puffs into the lungs every 6 (six) hours as needed. For shortness of breath      ALPRAZolam 0.5 MG tablet   Commonly known as: XANAX   Take 0.5 mg by mouth 3 (three) times daily as needed. For anxiety.      amLODipine 10 MG tablet   Commonly known as: NORVASC   Take 10 mg by mouth daily.      benazepril 10 MG tablet   Commonly known as: LOTENSIN   Take 10 mg by mouth daily.      calcium acetate 667 MG capsule   Commonly known as: PHOSLO   Take 667 mg by mouth 3 (three) times daily with meals.      calcium carbonate 500 MG chewable tablet   Commonly known as: TUMS - dosed in mg elemental calcium   Chew 2 tablets by mouth 2 (two) times daily.      carbamazepine 300 MG 12 hr capsule   Commonly known as: CARBATROL   Take 300 mg by mouth 2 (two) times daily.      escitalopram 20 MG tablet   Commonly known as: LEXAPRO   Take 20 mg by mouth daily.      fluticasone 50 MCG/ACT nasal spray   Commonly known as: FLONASE   Place 1 spray into the nose 2 (two) times daily.      HYDROcodone-acetaminophen 10-325 MG per tablet   Commonly known as: NORCO   Take 1 tablet by mouth 3 (three) times daily after meals.      metoprolol 50 MG tablet   Commonly known as: LOPRESSOR   Take 50 mg by mouth 2 (two) times daily.      Oxycodone HCl 10 MG Tabs    Take 1 tablet (10 mg total) by mouth  every 6 (six) hours as needed.      simvastatin 20 MG tablet   Commonly known as: ZOCOR   Take 20 mg by mouth at bedtime.      traZODone 50 MG tablet   Commonly known as: DESYREL   Take 1 tablet by mouth at bedtime as needed. For sleep           Follow-up Information    Follow up with NADEL,SCOTT M, MD on 07/22/2012. (2:00 pm)    Contact information:   [employer] [address] [place] [address] [phone]          Discharged Condition: good Vital signs at Discharge. Temp:  [97.6 F (36.4 C)-98.4 F (36.9 C)] 98.1 F (36.7 C) (09/10 1102) Pulse Rate:  [66-84] 73  (09/10 1138) Resp:  [16-20] 18  (09/10 1138) BP: (120-181)/(60-95) 160/65 mmHg (09/10 1138) SpO2:  [99 %-100 %] 100 % (09/10 1102) Weight:  [130 lb 11.7 oz (59.3 kg)-148 lb 2.4 oz (67.2 kg)] 130 lb 11.7 oz (59.3 kg) (09/10 1028) Office follow up Special Information or instructions. Give prescriptions for 10 day supply of Norco 10/325(thirty) and oxycodone 10 mg(thirty) day of discharge. Stop taking duragesic patch. Signed: Brett Canales Minor ACNP [facility] Pager [phone] till 3 pm If no answer page [phone] 07/12/2012, 1:02 PM  Coralyn Helling, MD [facility] Pulmonary/Critical Care 07/12/2012, 4:45 PM Pager:  [phone] After 3pm call: [phone]

## 2012-07-12 NOTE — Progress Notes (Signed)
Patient: Sean [name] DOB: [birth year] Date of Admission: 07/09/2012          [place] PCCM    HPI: 51 yo male smoker presented to [facility] 9/07 with acute respiratory distress transiently needing NIPPV and severe HTN.  Found to have pulmonary edema.  Hx of ESRD on HD.  Transferred to [facility] for further therapy 9/07. Respiratory status improved with HD. PMHx Chronic back pain, Hep C, Hypothyroidism, GERD, Anxiety/depression, COPD   Subjective: NAD, wants to go home.   Filed Vitals:   07/12/12 0700 07/12/12 0730 07/12/12 0800 07/12/12 0830  BP: 144/79 151/77 181/95 175/85  Pulse: 73 74 78 83  Temp:      TempSrc:      Resp: 18 18 18 18   Height:      Weight:      SpO2:      on RA  EXAM:  Gen: chronically ill appearing, NAD on RA. Currently in HD. Skin- tatoos HEENT: NAD Neck: + mild JVD Lungs: clear and unlabored, + bibasilar rales  Cardiovascular: RRR , no rub Abdomen: soft, NT, NABS Ext: No edema, AVF in RUE with good thrill Neuro: no focal deficits   DATA: CBC Lab Results  Component Value Date   WBC 6.0 07/12/2012   HGB 7.5* 07/12/2012   HCT 24.4* 07/12/2012   MCV 85.0 07/12/2012   PLT 209 07/12/2012     BMET Lab Results  Component Value Date   CREATININE 3.41* 07/12/2012   BUN 26* 07/12/2012   NA 133* 07/12/2012   K 3.7 07/12/2012   CL 94* 07/12/2012   CO2 27 07/12/2012    RADIOLOGY:  Dg Chest Port 1 View  07/11/2012  *RADIOLOGY REPORT*  Clinical Data: Pulmonary edema  PORTABLE CHEST - 1 VIEW  Comparison: 07/09/2012  Findings: Cardiomegaly again noted.  Persistent residual mild interstitial edema with slight improvement in aeration.  No segmental infiltrate.  IMPRESSION: No segmental infiltrate.  Persistent residual mild interstitial edema with slight improvement in aeration.  Cardiomegaly again noted.   Original Report Authenticated By: Natasha Mead, M.D.      IMPRESSION:    1. Acute respiratory failure r/t hypertension induced pulmonary edema - much improved  with HD PLAN  -  F/u CXR as needed Cont outpt regimen of antihypertensives Cont norco as outpt regimen for chronic back pain resume Duragesic on dc No indication abx or steroids at this time Smoking cessation   2. ESRD - TTS HD PLAN -  HD per renal -  Need additional HD 9/10 . Normal HD is T TH Sat in [place] Volume removal as tol  Cont home antiHTN - now off nitro gtt   3. Hep C Cirrhosis - for liver bx 9/10  Can have liver biopsy rescheduled as outpt [facility] radiology will call to reschedule on 9/11. Spoke with radiology 9/10 SM.  4. Chronic pain -  PLAN -  Cont norco for now  lidocaine patch 9/9 Will resume Duragesic on discharge and stop po narcotics.  Global: DC home this PM 9/10 Resume HD as before at [facility]. IR will call and reschedule [place]  BX of liver. Follow up as Opt with Alroy Dust. 07/22/12 @2 :00 pm  Brett Canales Minor ACNP [facility] Pager [phone] till 3 pm If no answer page [phone] 07/12/2012, 8:57 AM   Reviewed above, examined pt, and agree with assessment/plan.  Coralyn Helling, MD [facility] Pulmonary/Critical Care 07/12/2012, 9:51 AM Pager:  [phone] After 3pm call: [phone]

## 2012-07-12 NOTE — Procedures (Signed)
I was present at this dialysis session. I have reviewed the session itself and made appropriate changes.   Vinson Moselle, MD [employer] 07/12/2012, 6:45 AM

## 2012-07-12 NOTE — Telephone Encounter (Signed)
Received refill request for lexapro 20mg  from [facility].  Pt last seen by SN 8.27.13, discharged from hosp today 9.10.13 and had HFU w/ SN 9.20.13.  Refills sent to pharmacy.
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
## 2012-07-14 ENCOUNTER — Telehealth: Payer: Self-pay | Admitting: Pulmonary Disease

## 2012-07-14 ENCOUNTER — Other Ambulatory Visit: Payer: Self-pay | Admitting: Physician Assistant

## 2012-07-14 NOTE — Telephone Encounter (Signed)
Called and spoke with pt. He states that his back pain is worse to the point it is "throbbing constantly". He states norco and duragesic patches not helping at all. Oxycodone on med list but he states he could not afford this med. He states wants rx for percocet. I advised that this is on his allergy list due to itching and he states that it was not too bad and would rather have the itching than this pain. I see mention last ov of pain management referral if current regimen not working to pt's satisfaction. Please advise, thanks!! Allergies  Allergen Reactions  . Chantix (Varenicline) Other (See Comments)    Hallucination  . Percocet (Oxycodone-Acetaminophen) Itching  . Iron Other (See Comments)    Causes pt to bruise easily  . Morphine And Related Nausea And Vomiting

## 2012-07-15 ENCOUNTER — Other Ambulatory Visit: Payer: Self-pay | Admitting: Radiology

## 2012-07-15 LAB — CULTURE, BLOOD (ROUTINE X 2): Culture: NO GROWTH

## 2012-07-15 NOTE — Telephone Encounter (Signed)
Spoke with pt and notified of recs per SN. Pt verbalized understanding and states nothing further needed. Will call back on 07/29/12.

## 2012-07-15 NOTE — Telephone Encounter (Signed)
Per SN----we will not be able to fill any of his meds until 9/28----pt will need to call back on 9-27 and let us know what he wants to refill and we can do the rx for him to pick up.  He can use the duragesic patches for the pain every 3 days and he should have some of these.  If this is not working for him we can set him up to see ortho for the pain in his back or we can get him in to see pain  Management but this appt may take a while.  thanks

## 2012-07-18 ENCOUNTER — Ambulatory Visit (HOSPITAL_COMMUNITY): Admit: 2012-07-18 | Payer: Medicare Other

## 2012-07-18 ENCOUNTER — Encounter (HOSPITAL_COMMUNITY): Payer: Self-pay | Admitting: Pharmacy Technician

## 2012-07-19 ENCOUNTER — Other Ambulatory Visit: Payer: Self-pay | Admitting: Radiology

## 2012-07-19 ENCOUNTER — Telehealth: Payer: Self-pay | Admitting: Pulmonary Disease

## 2012-07-19 NOTE — Telephone Encounter (Signed)
I spoke with pt and he stated he did know about his HFU but did forget when it was scheduled. Pt stated he had his appts mixed up. Pt did not need anything further

## 2012-07-20 ENCOUNTER — Ambulatory Visit (HOSPITAL_COMMUNITY)
Admission: RE | Admit: 2012-07-20 | Discharge: 2012-07-20 | Disposition: A | Discharge status: Home / Self Care | Payer: Medicare Other | Source: Ambulatory Visit | Attending: Pulmonary Disease | Admitting: Pulmonary Disease

## 2012-07-20 VITALS — BP 170/88 | HR 70 | Temp 97.6°F | Resp 20

## 2012-07-20 DIAGNOSIS — K769 Liver disease, unspecified: Secondary | ICD-10-CM

## 2012-07-20 DIAGNOSIS — K7460 Unspecified cirrhosis of liver: Secondary | ICD-10-CM | POA: Insufficient documentation

## 2012-07-20 DIAGNOSIS — K7689 Other specified diseases of liver: Secondary | ICD-10-CM | POA: Insufficient documentation

## 2012-07-20 HISTORY — PX: LIVER BIOPSY: SHX301

## 2012-07-20 LAB — PROTIME-INR
INR: 1.08 (ref 0.00–1.49)
Prothrombin Time: 13.9 seconds (ref 11.6–15.2)

## 2012-07-20 LAB — CBC
HCT: 27.4 % — ABNORMAL LOW (ref 39.0–52.0)
Hemoglobin: 8.5 g/dL — ABNORMAL LOW (ref 13.0–17.0)
MCV: 80.4 fL (ref 78.0–100.0)
RBC: 3.41 MIL/uL — ABNORMAL LOW (ref 4.22–5.81)
WBC: 7 10*3/uL (ref 4.0–10.5)

## 2012-07-20 MED ORDER — FENTANYL CITRATE 0.05 MG/ML IJ SOLN
INTRAMUSCULAR | Status: AC
  Filled 2012-07-20: qty 4

## 2012-07-20 MED ORDER — HYDROCODONE-ACETAMINOPHEN 5-325 MG PO TABS
1.0000 | ORAL_TABLET | ORAL | Status: DC | PRN
  Administered 2012-07-20: 2 via ORAL

## 2012-07-20 MED ORDER — MIDAZOLAM HCL 2 MG/2ML IJ SOLN
INTRAMUSCULAR | Status: AC
  Filled 2012-07-20: qty 6

## 2012-07-20 MED ORDER — SODIUM CHLORIDE 0.9 % IV SOLN: INTRAVENOUS | Status: DC

## 2012-07-20 MED ORDER — FENTANYL CITRATE 0.05 MG/ML IJ SOLN
INTRAMUSCULAR | Status: AC | PRN
  Administered 2012-07-20 (×3): 50 ug via INTRAVENOUS

## 2012-07-20 MED ORDER — MIDAZOLAM HCL 5 MG/5ML IJ SOLN
INTRAMUSCULAR | Status: AC | PRN
  Administered 2012-07-20: 2 mg via INTRAVENOUS
  Administered 2012-07-20: 1 mg via INTRAVENOUS

## 2012-07-20 NOTE — Procedures (Signed)
[place] core liver lesion bx 18g x3 No complication No blood loss. See complete dictation in [facility].
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
## 2012-07-22 ENCOUNTER — Other Ambulatory Visit: Payer: Self-pay | Admitting: Pulmonary Disease

## 2012-07-22 ENCOUNTER — Telehealth: Payer: Self-pay | Admitting: Pulmonary Disease

## 2012-07-22 ENCOUNTER — Inpatient Hospital Stay: Payer: Medicare Other | Admitting: Pulmonary Disease

## 2012-07-22 DIAGNOSIS — C220 Liver cell carcinoma: Secondary | ICD-10-CM

## 2012-07-22 NOTE — Telephone Encounter (Signed)
Called and spoke with Sean [name] and she is aware that the valium has been called to the pharmacy and that the alprazolam has been cancelled.  Nothing further is needed.

## 2012-07-22 NOTE — Telephone Encounter (Signed)
Per SN---call and cancel his xanax----call in generic valium 5 mg  #90   1 po tid as needed for nerves.  Can give 5 refills. thanks

## 2012-07-22 NOTE — Telephone Encounter (Signed)
I spoke with spouse and she stated pt nerves is "really bad". Pt has xanax 1 mg and takes this TID. She stated pt is wanting valium's instead. Please advise SN thanks

## 2012-07-25 ENCOUNTER — Ambulatory Visit: Payer: Medicare Other | Admitting: Pulmonary Disease

## 2012-07-26 ENCOUNTER — Telehealth: Payer: Self-pay | Admitting: Oncology

## 2012-07-26 NOTE — Telephone Encounter (Signed)
C/D on 9/24 for appt 10/01.  NP

## 2012-07-27 ENCOUNTER — Telehealth: Payer: Self-pay | Admitting: Oncology

## 2012-07-27 ENCOUNTER — Telehealth: Payer: Self-pay | Admitting: Pulmonary Disease

## 2012-07-27 ENCOUNTER — Telehealth: Payer: Self-pay | Admitting: *Deleted

## 2012-07-27 NOTE — Telephone Encounter (Signed)
S/W pt in re NP appt 10/1 @ 1:30 w/ Dr. Truett Perna NP packet mailed.

## 2012-07-27 NOTE — Telephone Encounter (Addendum)
Called, spoke with pt's daughter, Cala Bradford.  States pt is having a lot of pain around liver area.  States yesterday he had sharp pain in this area and fell d/t the pain.  He has a gash on his head from the fall.  States they still haven't heard anything regarding an oncology appt.  Per chart, pt does have a pending OV on Oct 1 with Dr. Truett Perna but they haven't heard anything about this yet.  She would like to know if Dr. Kriste Basque will rx a pain med until pt can be seen by Oncology to get some relief.  States he is currently taking something for pain but unsure what this is.  Dr. Kriste Basque, pls advise on pain rx.  Thank you.   Will forward to [facility] -- can you pls verify pt's oncology appt bc they state they haven't heard anything about this yet.  I don't want to rely wrong information to the patient.  Thank you.  [facility]  Allergies  Allergen Reactions  . Chantix (Varenicline) Other (See Comments)    Hallucination  . Iron Other (See Comments)    Causes pt to bruise easily  . Morphine And Related Nausea And Vomiting

## 2012-07-27 NOTE — Telephone Encounter (Signed)
In the phone note it states that the cancer center spoke with the pt this am at 10 and that the pt is aware of his appt on oct 1 with Dr. Truett Perna.  Per SN---we are not able to give the pt any further pain meds.  If the pain meds that the pt has at this time are not helping him he will need to go to the er for eval.  thanks

## 2012-07-27 NOTE — Telephone Encounter (Signed)
Spoke with patient by phone.  He is aware of appointment with Dr. Truett Perna on 08/02/12.  He verbalized understanding and was without questions.

## 2012-07-27 NOTE — Telephone Encounter (Signed)
Spoke to [email or web address]  oncology pt has appt 08/02/12@1 :30pm and pt is aware Tobe Sos

## 2012-07-27 NOTE — Telephone Encounter (Signed)
Spoke with patients daughter-aware of recs from SN-states no one has called from Cancer center regarding appts; I gave appt time and dates and advised to call them if any questions/concerns. Pt's daughter is aware to take all meds, insurance information, and paper with questions/concerns with them to this appt.

## 2012-07-29 ENCOUNTER — Telehealth: Payer: Self-pay | Admitting: Pulmonary Disease

## 2012-07-29 MED ORDER — OXYCODONE HCL 10 MG PO TABS: 10.0000 mg | ORAL_TABLET | Freq: Four times a day (QID) | ORAL | Status: DC | PRN

## 2012-07-29 NOTE — Telephone Encounter (Signed)
Per SN-- ok to print out rx for the oxycodone---pt stated that is what they want him to take---the one without the tylenol in it due to his liver problems.  This has been printed and signed by SN and i have left this up front for the pt to pick up.

## 2012-08-02 ENCOUNTER — Ambulatory Visit: Payer: Medicare Other

## 2012-08-02 ENCOUNTER — Encounter: Payer: Self-pay | Admitting: Oncology

## 2012-08-02 ENCOUNTER — Telehealth: Payer: Self-pay | Admitting: Oncology

## 2012-08-02 ENCOUNTER — Ambulatory Visit (HOSPITAL_BASED_OUTPATIENT_CLINIC_OR_DEPARTMENT_OTHER): Payer: Medicare Other | Admitting: Oncology

## 2012-08-02 VITALS — BP 130/74 | HR 82 | Temp 97.7°F | Resp 20 | Ht 71.0 in | Wt 136.6 lb

## 2012-08-02 DIAGNOSIS — C710 Malignant neoplasm of cerebrum, except lobes and ventricles: Secondary | ICD-10-CM

## 2012-08-02 DIAGNOSIS — J449 Chronic obstructive pulmonary disease, unspecified: Secondary | ICD-10-CM

## 2012-08-02 DIAGNOSIS — R599 Enlarged lymph nodes, unspecified: Secondary | ICD-10-CM

## 2012-08-02 DIAGNOSIS — C229 Malignant neoplasm of liver, not specified as primary or secondary: Secondary | ICD-10-CM

## 2012-08-02 DIAGNOSIS — R52 Pain, unspecified: Secondary | ICD-10-CM

## 2012-08-02 MED ORDER — HYDROMORPHONE HCL 2 MG PO TABS: 2.0000 mg | ORAL_TABLET | ORAL | Status: DC | PRN

## 2012-08-02 NOTE — Progress Notes (Signed)
Checked in new pt with not financial concerns.
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
## 2012-08-02 NOTE — Telephone Encounter (Signed)
appts made and printed for pt aom °

## 2012-08-03 ENCOUNTER — Other Ambulatory Visit: Payer: Self-pay | Admitting: Pulmonary Disease

## 2012-08-03 MED ORDER — METOPROLOL TARTRATE 50 MG PO TABS: 50.0000 mg | ORAL_TABLET | Freq: Two times a day (BID) | ORAL | Status: AC

## 2012-08-12 ENCOUNTER — Telehealth: Payer: Self-pay | Admitting: Pulmonary Disease

## 2012-08-12 MED ORDER — ALPRAZOLAM 1 MG PO TABS: ORAL_TABLET | ORAL | Status: DC

## 2012-08-12 NOTE — Telephone Encounter (Signed)
Per SN----ask him which he wants  1.  Valium 10 mg  1 po tid prn  #90 with 5 refills 2.  Xanax 1 mg  1 po tid prn #90 with 5 refills  Then cancel the other at the pharmacy.  thanks

## 2012-08-12 NOTE — Telephone Encounter (Addendum)
Spoke with Rosey Bath, she states that he wants the xanax I have called the pharmacy and cancelled the valium and called in alprazolam Pt states nothing further needed.

## 2012-08-12 NOTE — Addendum Note (Signed)
Addended by: Christen Butter on: 08/12/2012 05:23 PM   Modules accepted: Orders

## 2012-08-12 NOTE — Telephone Encounter (Signed)
Closed in error.

## 2012-08-12 NOTE — Telephone Encounter (Signed)
I spoke with Teresea(pt ex wife) and she states pt is really down and his nerves are "shot". The diazepam is not working for pt. Per Rosey Bath pt would like xanax 1 mg to see if it helps. Please advise SN thanks

## 2012-08-22 ENCOUNTER — Telehealth: Payer: Self-pay | Admitting: Pulmonary Disease

## 2012-08-22 NOTE — Telephone Encounter (Signed)
Last OV 06-28-12.Pt is requesting a refill on oxycodone. Last fill on 07/29/12 #120. Please advise if ok to refill. Pt will come pick-up rx. [name], CMA

## 2012-08-22 NOTE — Telephone Encounter (Signed)
Per SN---can not fill until 10/27  For #120.  He also got dilaudid from Dr. Truett Perna.  SN can only fill #120 percocet per month and if this is not enough we will need to send him to pain clinic to follow.   thanks

## 2012-08-22 NOTE — Telephone Encounter (Signed)
I spoke with the pt spouse, pt went to store, and she is unsure of what medication is needed so she will have the pt call back.[name], CMA

## 2012-08-22 NOTE — Telephone Encounter (Signed)
Pt advised. Jennifer Castillo, CMA  

## 2012-08-24 ENCOUNTER — Ambulatory Visit (HOSPITAL_BASED_OUTPATIENT_CLINIC_OR_DEPARTMENT_OTHER): Payer: Medicare Other | Admitting: Oncology

## 2012-08-24 ENCOUNTER — Other Ambulatory Visit: Payer: Self-pay | Admitting: *Deleted

## 2012-08-24 VITALS — BP 165/90 | HR 98 | Temp 98.0°F | Resp 20 | Ht 71.0 in | Wt 140.7 lb

## 2012-08-24 DIAGNOSIS — C710 Malignant neoplasm of cerebrum, except lobes and ventricles: Secondary | ICD-10-CM

## 2012-08-24 DIAGNOSIS — C220 Liver cell carcinoma: Secondary | ICD-10-CM

## 2012-08-24 DIAGNOSIS — C229 Malignant neoplasm of liver, not specified as primary or secondary: Secondary | ICD-10-CM

## 2012-08-24 DIAGNOSIS — J449 Chronic obstructive pulmonary disease, unspecified: Secondary | ICD-10-CM

## 2012-08-24 DIAGNOSIS — G8929 Other chronic pain: Secondary | ICD-10-CM

## 2012-08-24 DIAGNOSIS — F17200 Nicotine dependence, unspecified, uncomplicated: Secondary | ICD-10-CM

## 2012-08-24 MED ORDER — HYDROMORPHONE HCL 2 MG PO TABS: 2.0000 mg | ORAL_TABLET | ORAL | Status: DC | PRN

## 2012-08-24 NOTE — Telephone Encounter (Signed)
appts made and printed for pt aom °

## 2012-08-24 NOTE — Progress Notes (Signed)
   [facility]    OFFICE PROGRESS NOTE   INTERVAL HISTORY:   He continues to have diffuse abdominal pain. No new complaint. He continues T, Th, Sat. Dialysis. Dilaudid relieved the pain.  Objective:  Vital signs in last 24 hours:  Blood pressure 165/90, pulse 98, temperature 98 F (36.7 C), temperature source Oral, resp. rate 20, height 5\' 11"  (1.803 m), weight 140 lb 11.2 oz (63.821 kg).   Resp: lungs clear Cardio: RRR GI: no HSM, nontender Vascular: no leg edema   Medications: I have reviewed the patient's current medications.  Assessment/Plan: 1. Hepatocellular carcinoma-status post an ultrasound-guided biopsy of an isolated left liver lesion on 07/22/2012, CT 06/25/2012 confirmed a 2.4 x 1.9 x 2.7 cm enhancing lesion in the lateral segment of the left liver with underlying cirrhotic changes  2. Hepatitis C  3. Cirrhosis  4. End-stage renal failure on hemodialysis  5. Ongoing tobacco use  6. Hypertension  7. History of a seizure disorder  8. Pruritus-? Secondary to cirrhosis  9. Porphyria cutanea tarda  10. COPD  11. Pain-etiology unclear. He has chronic pain.  No longer taking oxycodone.  I refilled the dilaudid today.    Disposition:  He appears stable. His case was not presented at the GI conference on 10/16.  We will present his case on 10/30 and I will ask Dr. Donell Beers to review the abd. CT.  He will return for an office visit in 3 weeks.   Thornton Papas, MD  08/24/2012  7:22 PM

## 2012-08-29 ENCOUNTER — Telehealth: Payer: Self-pay | Admitting: Pulmonary Disease

## 2012-08-29 DIAGNOSIS — Z992 Dependence on renal dialysis: Secondary | ICD-10-CM

## 2012-08-29 DIAGNOSIS — C220 Liver cell carcinoma: Secondary | ICD-10-CM

## 2012-08-29 MED ORDER — OXYCODONE HCL 10 MG PO TABS: ORAL_TABLET | ORAL | Status: DC

## 2012-08-29 NOTE — Telephone Encounter (Signed)
Pt wife called again Rosey Bath). Leanora Ivanoff

## 2012-08-29 NOTE — Telephone Encounter (Signed)
Pt called back again- wants rx called in asap and wants dr nadel to get him in with pain clinic "asap" as well. [facility]. Sean [name]

## 2012-08-29 NOTE — Telephone Encounter (Signed)
Pt called on 08/22/12 and requested a refill on oxycodone. Recs were as follows: [name], CMA 08/22/2012 5:08 PM Signed  Per SN---can not fill until 10/27 For #120. He also got dilaudid from Dr. Truett Perna. SN can only fill #120 percocet per month and if this is not enough we will need to send him to pain clinic to follow. thanks  Pt is calling today for refill. Please advise if ok to send refill for #120. [name], CMA

## 2012-08-29 NOTE — Telephone Encounter (Signed)
rx has been printed and left up front for the pt to pick up.  i will place and referral for the pain clinic. i called and spoke with pt and he is aware of the rx up front to be picked up.  Pt stated that the dilaudid is what is causing him to itch---i have added this to his allergy list.  Pt is aware that we will get the appt with pain clinic.  Nothing further is needed.

## 2012-08-31 ENCOUNTER — Telehealth: Payer: Self-pay | Admitting: *Deleted

## 2012-08-31 NOTE — Telephone Encounter (Signed)
Spoke with patient by phone, per request of Dr. Truett Perna.  The patient was informed by this RN to expect a call from Dr. Arita Miss office with an appointment for consultation.  Dr. Arita Miss name and office number were given to patient.  He verbalized understanding.

## 2012-09-02 ENCOUNTER — Ambulatory Visit (INDEPENDENT_AMBULATORY_CARE_PROVIDER_SITE_OTHER): Payer: Medicare Other | Admitting: General Surgery

## 2012-09-02 ENCOUNTER — Encounter (INDEPENDENT_AMBULATORY_CARE_PROVIDER_SITE_OTHER): Payer: Self-pay | Admitting: General Surgery

## 2012-09-02 VITALS — BP 152/90 | HR 72 | Temp 97.9°F | Resp 18 | Ht 71.0 in | Wt 134.2 lb

## 2012-09-02 DIAGNOSIS — C228 Malignant neoplasm of liver, primary, unspecified as to type: Secondary | ICD-10-CM

## 2012-09-02 DIAGNOSIS — C220 Liver cell carcinoma: Secondary | ICD-10-CM | POA: Insufficient documentation

## 2012-09-02 MED ORDER — HYDROXYZINE PAMOATE 100 MG PO CAPS: 100.0000 mg | ORAL_CAPSULE | Freq: Three times a day (TID) | ORAL | Status: DC | PRN
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
## 2012-09-02 NOTE — Patient Instructions (Signed)
Follow up with interventional radiology

## 2012-09-02 NOTE — Assessment & Plan Note (Signed)
The patient has a isolated hepatocellular cancer. Unfortunately, he is not a surgical candidate due to his renal failure and cirrhosis with ascites and splenomegaly.  He has lost 40 pounds over the past few months and is in poor shape.  He is very weak and has minimal activity around the house.    I think he would do much better getting an ablation or other percutaneous treatment.  I offered him a second opinion for surgery in [place] or at [facility].  I do not know why he has such severe itching.  I wrote a script for Vistaril.  He has chronic pain and is intolerant of dilaudid, fentanyl, morphine, hydrocodone, and oxycodone.    I recommended pain clinic.

## 2012-09-07 ENCOUNTER — Other Ambulatory Visit: Payer: Self-pay | Admitting: *Deleted

## 2012-09-07 MED ORDER — FLUTICASONE PROPIONATE 50 MCG/ACT NA SUSP: 1.0000 | Freq: Two times a day (BID) | NASAL | Status: AC

## 2012-09-08 ENCOUNTER — Encounter: Payer: Self-pay | Admitting: *Deleted

## 2012-09-09 ENCOUNTER — Ambulatory Visit: Payer: Medicare Other | Admitting: Pulmonary Disease

## 2012-09-14 ENCOUNTER — Telehealth: Payer: Self-pay | Admitting: Oncology

## 2012-09-14 ENCOUNTER — Ambulatory Visit (HOSPITAL_BASED_OUTPATIENT_CLINIC_OR_DEPARTMENT_OTHER): Payer: Medicare Other | Admitting: Oncology

## 2012-09-14 ENCOUNTER — Other Ambulatory Visit: Payer: Self-pay | Admitting: *Deleted

## 2012-09-14 VITALS — BP 158/90 | HR 80 | Temp 97.6°F | Resp 20 | Ht 71.0 in | Wt 150.2 lb

## 2012-09-14 DIAGNOSIS — E8779 Other fluid overload: Secondary | ICD-10-CM

## 2012-09-14 DIAGNOSIS — C228 Malignant neoplasm of liver, primary, unspecified as to type: Secondary | ICD-10-CM

## 2012-09-14 DIAGNOSIS — C220 Liver cell carcinoma: Secondary | ICD-10-CM

## 2012-09-14 DIAGNOSIS — B1920 Unspecified viral hepatitis C without hepatic coma: Secondary | ICD-10-CM

## 2012-09-14 DIAGNOSIS — N186 End stage renal disease: Secondary | ICD-10-CM

## 2012-09-14 MED ORDER — OXYCODONE HCL 5 MG PO TABS: 5.0000 mg | ORAL_TABLET | Freq: Four times a day (QID) | ORAL | Status: DC | PRN

## 2012-09-14 NOTE — Progress Notes (Signed)
   [facility]    OFFICE PROGRESS NOTE   INTERVAL HISTORY:   He returns as scheduled. He continues to have pain in the abdomen. He could not tolerate Dilaudid. He skipped dialysis just today.  Mr. Denardo saw Dr. Donell Beers and she does not recommend surgery. Dr. Donell Beers prescribed Vistaril for pruritus and he reports this helped.  Objective:  Vital signs in last 24 hours:  Blood pressure 158/90, pulse 80, temperature 97.6 F (36.4 C), temperature source Oral, resp. rate 20, height 5\' 11"  (1.803 m), weight 150 lb 3.2 oz (68.13 kg). Resp: Inspiratory rhonchi at the low posterior chest bilaterally, no respiratory distress Cardio:  Regular rate and rhythm GI: Mild tenderness in the right upper abdomen,? Palpable liver edge, no splenomegaly Vascular: Trace-1+ edema below the knee bilaterally  Medications: I have reviewed the patient's current medications.  Assessment/Plan: 1. Hepatocellular carcinoma-status post an ultrasound-guided biopsy of an isolated left liver lesion on 07/22/2012, CT 06/25/2012 confirmed a 2.4 x 1.9 x 2.7 cm enhancing lesion in the lateral segment of the left liver with underlying cirrhotic changes  2. Hepatitis C  3. Cirrhosis  4. End-stage renal failure on hemodialysis -I encouraged him to resume hemodialysis on 09/15/2012. 5. Ongoing tobacco use  6. Hypertension  7. History of a seizure disorder  8. Pruritus-? Secondary to cirrhosis  9. Porphyria cutanea tarda  10. COPD  11. Pain-etiology unclear. He has chronic pain. He requested a prescription for oxycodone today.    Disposition:  He appears stable. His case was presented at the GI tumor conference 2 weeks ago. The consensus was to proceed with a surgical referral with the next next option being radioablation. Mr. Hannen saw Dr. Donell Beers and he is not a candidate for surgery. I discussed treatment options with Mr. Stuckey today. He agrees to a referral to interventional radiology for consideration of  a radioablation procedure. If he is not a candidate for radioablation we will make a referral to radiation oncology to consider SRS. We also discussed a second opinion at [facility]. He would like to proceed with the interventional radiology evaluation.  Mr. Dyar will return for an office visit in approximately one month. I encouraged him to resume hemodialysis. He appears to have volume overload today.   Thornton Papas, MD  09/14/2012  5:48 PM

## 2012-09-14 NOTE — Telephone Encounter (Signed)
appts made and printed for pt  °

## 2012-09-16 ENCOUNTER — Encounter: Payer: Self-pay | Admitting: *Deleted

## 2012-09-19 ENCOUNTER — Ambulatory Visit: Payer: Self-pay | Admitting: Pulmonary Disease

## 2012-09-21 ENCOUNTER — Encounter: Payer: Self-pay | Admitting: Radiation Oncology

## 2012-09-21 ENCOUNTER — Ambulatory Visit
Admission: RE | Admit: 2012-09-21 | Discharge: 2012-09-21 | Disposition: A | Discharge status: Home / Self Care | Payer: Medicare Other | Source: Ambulatory Visit | Attending: Radiation Oncology | Admitting: Radiation Oncology

## 2012-09-21 VITALS — BP 135/70 | HR 73 | Temp 98.5°F | Wt 142.3 lb

## 2012-09-21 VITALS — BP 135/70 | HR 73 | Temp 98.5°F | Resp 16 | Wt 142.3 lb

## 2012-09-21 DIAGNOSIS — E039 Hypothyroidism, unspecified: Secondary | ICD-10-CM | POA: Insufficient documentation

## 2012-09-21 DIAGNOSIS — C220 Liver cell carcinoma: Secondary | ICD-10-CM

## 2012-09-21 DIAGNOSIS — F17200 Nicotine dependence, unspecified, uncomplicated: Secondary | ICD-10-CM | POA: Insufficient documentation

## 2012-09-21 DIAGNOSIS — J4489 Other specified chronic obstructive pulmonary disease: Secondary | ICD-10-CM | POA: Insufficient documentation

## 2012-09-21 DIAGNOSIS — J449 Chronic obstructive pulmonary disease, unspecified: Secondary | ICD-10-CM | POA: Insufficient documentation

## 2012-09-21 DIAGNOSIS — E7800 Pure hypercholesterolemia, unspecified: Secondary | ICD-10-CM | POA: Insufficient documentation

## 2012-09-21 DIAGNOSIS — Z79899 Other long term (current) drug therapy: Secondary | ICD-10-CM | POA: Insufficient documentation

## 2012-09-21 DIAGNOSIS — Z992 Dependence on renal dialysis: Secondary | ICD-10-CM | POA: Insufficient documentation

## 2012-09-21 DIAGNOSIS — C228 Malignant neoplasm of liver, primary, unspecified as to type: Secondary | ICD-10-CM | POA: Insufficient documentation

## 2012-09-21 DIAGNOSIS — N186 End stage renal disease: Secondary | ICD-10-CM | POA: Insufficient documentation

## 2012-09-21 DIAGNOSIS — I120 Hypertensive chronic kidney disease with stage 5 chronic kidney disease or end stage renal disease: Secondary | ICD-10-CM | POA: Insufficient documentation

## 2012-09-21 NOTE — Addendum Note (Signed)
Encounter addended by: Davanee Klinkner Mintz Ellowyn Rieves, RN on: 09/21/2012  5:59 PM<BR>     Documentation filed: Charges VN

## 2012-09-21 NOTE — Progress Notes (Signed)
Lower abdomen - Frades pain as 6-7 on scale of 0-10.  Taking  Oxcodone 5 mg tabs for pain but states inadequate relief.  He states he was on 10 mg tabs but dose decreased dose to 5 mg tabs.  He admits to  mild itching with Oxcodone as well as  Dilaudid which was stopped due to intense itching.  States he eats small amounts of food and eats more frequently because of early satiety. Denies any difficulty voiding and denies any diarrhea or constipation.  Sallow skin color.  Accompanied by his mother, Myriam Jacobson.
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
## 2012-09-23 NOTE — Addendum Note (Signed)
Encounter addended by: Lonie Peak, MD on: 09/23/2012  6:59 PM<BR>     Documentation filed: Inpatient Notes, Notes Section

## 2012-09-23 NOTE — Addendum Note (Signed)
Encounter addended by: Lonie Peak, MD on: 09/23/2012  6:29 PM<BR>     Documentation filed: Notes Section

## 2012-09-26 ENCOUNTER — Other Ambulatory Visit: Payer: Self-pay | Admitting: Radiation Oncology

## 2012-09-26 DIAGNOSIS — C220 Liver cell carcinoma: Secondary | ICD-10-CM

## 2012-09-27 ENCOUNTER — Telehealth: Payer: Self-pay | Admitting: *Deleted

## 2012-09-27 NOTE — Telephone Encounter (Signed)
Called patient to inform of appt. With Dr. Flonnie Hailstone being rescheduled for 10-07-12 at 3:00 pm, spoke with patient and he is aware of this appt.

## 2012-09-27 NOTE — Telephone Encounter (Signed)
xxxx 

## 2012-09-27 NOTE — Telephone Encounter (Signed)
CALLED PATIENT TO INFORM OF TEST, SPOKE WITH PATIENT AND HE IS AWARE OF THIS TEST. 

## 2012-09-28 ENCOUNTER — Telehealth: Payer: Self-pay | Admitting: *Deleted

## 2012-09-28 NOTE — Telephone Encounter (Signed)
@  1255 Left VM returning call. Was in restroom when nurse called earlier.

## 2012-09-28 NOTE — Telephone Encounter (Signed)
@  0920 VM reporting "my liver is killing me. The pain pills Dr. Truett Perna gave me are not working". Sees surgeon on 10/07/12 (Dr. Flonnie Hailstone at [facility]).

## 2012-09-28 NOTE — Telephone Encounter (Signed)
Per Dr. Truett Perna, try taking #2 oxycodone every 6 hours for pain and see if this helps him. Need to see surgeon to assess pain. Patient agrees to increase dose and try this.

## 2012-09-28 NOTE — Telephone Encounter (Signed)
Reports persistent, unrelieved mid abdominal pain rated "7" that never improves despite Oxycodone 3/day. Pain is a "hard, constant pain". RN confirmed that he is going to his dialysis appointments 3/week.

## 2012-09-28 NOTE — Telephone Encounter (Signed)
@  1230 Left VM for patient to return call to discuss his pain.

## 2012-10-01 ENCOUNTER — Telehealth: Payer: Self-pay | Admitting: Oncology

## 2012-10-01 NOTE — Telephone Encounter (Signed)
On call Med Onc  Per answering service patient requesting call back re pain [phone]. Went to VM and I LM for him to call back if needed.  L.Rance Smithson,MD

## 2012-10-03 ENCOUNTER — Emergency Department (HOSPITAL_COMMUNITY)
Admission: EM | Admit: 2012-10-03 | Discharge: 2012-10-03 | Disposition: A | Discharge status: Home / Self Care | Payer: Medicare Other | Attending: Emergency Medicine | Admitting: Emergency Medicine

## 2012-10-03 ENCOUNTER — Encounter (HOSPITAL_COMMUNITY): Payer: Self-pay | Admitting: Emergency Medicine

## 2012-10-03 ENCOUNTER — Emergency Department (HOSPITAL_COMMUNITY): Payer: Medicare Other

## 2012-10-03 ENCOUNTER — Telehealth: Payer: Self-pay | Admitting: *Deleted

## 2012-10-03 DIAGNOSIS — E039 Hypothyroidism, unspecified: Secondary | ICD-10-CM | POA: Insufficient documentation

## 2012-10-03 DIAGNOSIS — D649 Anemia, unspecified: Secondary | ICD-10-CM | POA: Insufficient documentation

## 2012-10-03 DIAGNOSIS — IMO0001 Reserved for inherently not codable concepts without codable children: Secondary | ICD-10-CM | POA: Insufficient documentation

## 2012-10-03 DIAGNOSIS — R42 Dizziness and giddiness: Secondary | ICD-10-CM | POA: Insufficient documentation

## 2012-10-03 DIAGNOSIS — E7800 Pure hypercholesterolemia, unspecified: Secondary | ICD-10-CM | POA: Insufficient documentation

## 2012-10-03 DIAGNOSIS — F411 Generalized anxiety disorder: Secondary | ICD-10-CM | POA: Insufficient documentation

## 2012-10-03 DIAGNOSIS — R0602 Shortness of breath: Secondary | ICD-10-CM | POA: Insufficient documentation

## 2012-10-03 DIAGNOSIS — I129 Hypertensive chronic kidney disease with stage 1 through stage 4 chronic kidney disease, or unspecified chronic kidney disease: Secondary | ICD-10-CM | POA: Insufficient documentation

## 2012-10-03 DIAGNOSIS — E538 Deficiency of other specified B group vitamins: Secondary | ICD-10-CM | POA: Insufficient documentation

## 2012-10-03 DIAGNOSIS — R0789 Other chest pain: Secondary | ICD-10-CM | POA: Insufficient documentation

## 2012-10-03 DIAGNOSIS — R5383 Other fatigue: Secondary | ICD-10-CM | POA: Insufficient documentation

## 2012-10-03 DIAGNOSIS — M2550 Pain in unspecified joint: Secondary | ICD-10-CM | POA: Insufficient documentation

## 2012-10-03 DIAGNOSIS — Z8679 Personal history of other diseases of the circulatory system: Secondary | ICD-10-CM | POA: Insufficient documentation

## 2012-10-03 DIAGNOSIS — M79671 Pain in right foot: Secondary | ICD-10-CM

## 2012-10-03 DIAGNOSIS — F3289 Other specified depressive episodes: Secondary | ICD-10-CM | POA: Insufficient documentation

## 2012-10-03 DIAGNOSIS — M549 Dorsalgia, unspecified: Secondary | ICD-10-CM | POA: Insufficient documentation

## 2012-10-03 DIAGNOSIS — R5381 Other malaise: Secondary | ICD-10-CM | POA: Insufficient documentation

## 2012-10-03 DIAGNOSIS — Z8719 Personal history of other diseases of the digestive system: Secondary | ICD-10-CM | POA: Insufficient documentation

## 2012-10-03 DIAGNOSIS — J449 Chronic obstructive pulmonary disease, unspecified: Secondary | ICD-10-CM

## 2012-10-03 DIAGNOSIS — F17200 Nicotine dependence, unspecified, uncomplicated: Secondary | ICD-10-CM | POA: Insufficient documentation

## 2012-10-03 DIAGNOSIS — J4489 Other specified chronic obstructive pulmonary disease: Secondary | ICD-10-CM | POA: Insufficient documentation

## 2012-10-03 DIAGNOSIS — J329 Chronic sinusitis, unspecified: Secondary | ICD-10-CM | POA: Insufficient documentation

## 2012-10-03 DIAGNOSIS — N19 Unspecified kidney failure: Secondary | ICD-10-CM

## 2012-10-03 DIAGNOSIS — R062 Wheezing: Secondary | ICD-10-CM | POA: Insufficient documentation

## 2012-10-03 DIAGNOSIS — Z8639 Personal history of other endocrine, nutritional and metabolic disease: Secondary | ICD-10-CM | POA: Insufficient documentation

## 2012-10-03 DIAGNOSIS — N186 End stage renal disease: Secondary | ICD-10-CM | POA: Insufficient documentation

## 2012-10-03 DIAGNOSIS — I509 Heart failure, unspecified: Secondary | ICD-10-CM | POA: Insufficient documentation

## 2012-10-03 DIAGNOSIS — F329 Major depressive disorder, single episode, unspecified: Secondary | ICD-10-CM | POA: Insufficient documentation

## 2012-10-03 DIAGNOSIS — Z79899 Other long term (current) drug therapy: Secondary | ICD-10-CM | POA: Insufficient documentation

## 2012-10-03 DIAGNOSIS — Z862 Personal history of diseases of the blood and blood-forming organs and certain disorders involving the immune mechanism: Secondary | ICD-10-CM | POA: Insufficient documentation

## 2012-10-03 DIAGNOSIS — Z8619 Personal history of other infectious and parasitic diseases: Secondary | ICD-10-CM | POA: Insufficient documentation

## 2012-10-03 LAB — CBC WITH DIFFERENTIAL/PLATELET
Basophils Relative: 1 % (ref 0–1)
Eosinophils Absolute: 0.5 10*3/uL (ref 0.0–0.7)
Eosinophils Relative: 5 % (ref 0–5)
Hemoglobin: 7.9 g/dL — ABNORMAL LOW (ref 13.0–17.0)
Lymphs Abs: 2.3 10*3/uL (ref 0.7–4.0)
MCH: 20.9 pg — ABNORMAL LOW (ref 26.0–34.0)
MCHC: 29 g/dL — ABNORMAL LOW (ref 30.0–36.0)
MCV: 72 fL — ABNORMAL LOW (ref 78.0–100.0)
Monocytes Absolute: 1.4 10*3/uL — ABNORMAL HIGH (ref 0.1–1.0)
RBC: 3.78 MIL/uL — ABNORMAL LOW (ref 4.22–5.81)

## 2012-10-03 LAB — COMPREHENSIVE METABOLIC PANEL
ALT: 23 U/L (ref 0–53)
AST: 41 U/L — ABNORMAL HIGH (ref 0–37)
Albumin: 3.2 g/dL — ABNORMAL LOW (ref 3.5–5.2)
CO2: 23 mEq/L (ref 19–32)
Calcium: 9.1 mg/dL (ref 8.4–10.5)
Chloride: 97 mEq/L (ref 96–112)
Creatinine, Ser: 6.67 mg/dL — ABNORMAL HIGH (ref 0.50–1.35)
GFR calc non Af Amer: 9 mL/min — ABNORMAL LOW (ref >90)
Sodium: 141 mEq/L (ref 135–145)
Total Bilirubin: 0.7 mg/dL (ref 0.3–1.2)

## 2012-10-03 MED ORDER — ALBUTEROL SULFATE (5 MG/ML) 0.5% IN NEBU
5.0000 mg | INHALATION_SOLUTION | Freq: Once | RESPIRATORY_TRACT | Status: AC
  Administered 2012-10-03: 5 mg via RESPIRATORY_TRACT
  Filled 2012-10-03: qty 1

## 2012-10-03 MED ORDER — LORAZEPAM 1 MG PO TABS: 1.0000 mg | ORAL_TABLET | Freq: Three times a day (TID) | ORAL | Status: DC | PRN

## 2012-10-03 MED ORDER — OXYCODONE HCL 5 MG PO TABS
10.0000 mg | ORAL_TABLET | Freq: Once | ORAL | Status: AC
  Administered 2012-10-03: 10 mg via ORAL
  Filled 2012-10-03: qty 2

## 2012-10-03 MED ORDER — LORAZEPAM 1 MG PO TABS
1.0000 mg | ORAL_TABLET | Freq: Once | ORAL | Status: AC
  Administered 2012-10-03: 1 mg via ORAL
  Filled 2012-10-03: qty 1

## 2012-10-03 MED ORDER — OXYCODONE HCL 5 MG PO TABS: ORAL_TABLET | ORAL | Status: DC

## 2012-10-03 MED ORDER — PREDNISONE 20 MG PO TABS
60.0000 mg | ORAL_TABLET | Freq: Once | ORAL | Status: AC
  Administered 2012-10-03: 60 mg via ORAL
  Filled 2012-10-03: qty 3

## 2012-10-03 MED ORDER — ALBUTEROL SULFATE HFA 108 (90 BASE) MCG/ACT IN AERS
2.0000 | INHALATION_SPRAY | Freq: Once | RESPIRATORY_TRACT | Status: AC
  Administered 2012-10-03: 2 via RESPIRATORY_TRACT
  Filled 2012-10-03: qty 6.7

## 2012-10-03 MED ORDER — OXYCODONE-ACETAMINOPHEN 5-325 MG PO TABS: 1.0000 | ORAL_TABLET | Freq: Once | ORAL | Status: DC

## 2012-10-03 NOTE — ED Provider Notes (Signed)
Complains of shortness of breath onset 3 days ago. Patient had dialysis 3 days ago and stayed for "my goal" stating he remained in dialysis until his baseline weight was achieved. He also complains of bilateral foot pain and plantar surfaces of both feet for one week. Recently his pain medication has been increased. On exam patient is alert nontoxic appearing speaks in paragraphs while receiving a neb lungs with diffuse scant crackles bilateral lower extremities with minimal pedal edema not red warm or tender DP pulses 2+ bilaterally  Doug Sou, MD 10/04/12 [phone]

## 2012-10-03 NOTE — ED Notes (Signed)
Was seen at [facility] yesterday  Given a patch and told to go to dialysis today because his potassium was high  Missed  Some of tx on sat  Because his back was hurting got off ealy he said  Is T Th Sa pt
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
## 2012-10-03 NOTE — Telephone Encounter (Signed)
Called patient to give message per Dr. Basilio Cairo, lvm for a return call.

## 2012-10-04 ENCOUNTER — Telehealth: Payer: Self-pay | Admitting: *Deleted

## 2012-10-04 NOTE — Telephone Encounter (Signed)
Pt called requesting more pain meds.  Returned call to pt clarifying if pt received script 10/03/12 in ED.  Pt states he did receive script for Oxycodone 5mg  in ED yesterday but only for 20 tablets.  States this office increased dosage to 2 tablets every 6 hours for pain and that helps "control the burning pain in my feet"  Note to Dr. Truett Perna.    Called and spoke with pt; per Dr. Truett Perna will NOT re-fill pain med.  Pt needs to call Dr. Kriste Basque for management.  Pt verbalized understanding.

## 2012-10-04 NOTE — ED Provider Notes (Signed)
Medical screening examination/treatment/procedure(s) were conducted as a shared visit with non-physician practitioner(s) and myself.  I personally evaluated the patient during the encounter  Doug Sou, MD 10/04/12 [phone]

## 2012-10-17 ENCOUNTER — Telehealth: Payer: Self-pay | Admitting: Pulmonary Disease

## 2012-10-17 ENCOUNTER — Ambulatory Visit: Payer: Medicare Other | Admitting: Oncology

## 2012-10-17 MED ORDER — ESCITALOPRAM OXALATE 20 MG PO TABS: 20.0000 mg | ORAL_TABLET | Freq: Every day | ORAL | Status: DC

## 2012-10-17 NOTE — Telephone Encounter (Signed)
Refill has been sent to the pharmacy.  

## 2012-10-31 ENCOUNTER — Encounter: Payer: Self-pay | Admitting: Pulmonary Disease

## 2012-10-31 ENCOUNTER — Ambulatory Visit (INDEPENDENT_AMBULATORY_CARE_PROVIDER_SITE_OTHER): Payer: Medicare Other | Admitting: Pulmonary Disease

## 2012-10-31 VITALS — BP 122/62 | HR 60 | Temp 98.1°F | Ht 71.0 in | Wt 130.2 lb

## 2012-10-31 DIAGNOSIS — H269 Unspecified cataract: Secondary | ICD-10-CM

## 2012-10-31 DIAGNOSIS — E039 Hypothyroidism, unspecified: Secondary | ICD-10-CM

## 2012-10-31 DIAGNOSIS — C220 Liver cell carcinoma: Secondary | ICD-10-CM

## 2012-10-31 DIAGNOSIS — C228 Malignant neoplasm of liver, primary, unspecified as to type: Secondary | ICD-10-CM

## 2012-10-31 DIAGNOSIS — N186 End stage renal disease: Secondary | ICD-10-CM

## 2012-10-31 DIAGNOSIS — N189 Chronic kidney disease, unspecified: Secondary | ICD-10-CM

## 2012-10-31 DIAGNOSIS — K219 Gastro-esophageal reflux disease without esophagitis: Secondary | ICD-10-CM

## 2012-10-31 DIAGNOSIS — F17200 Nicotine dependence, unspecified, uncomplicated: Secondary | ICD-10-CM

## 2012-10-31 DIAGNOSIS — R569 Unspecified convulsions: Secondary | ICD-10-CM

## 2012-10-31 DIAGNOSIS — B182 Chronic viral hepatitis C: Secondary | ICD-10-CM

## 2012-10-31 DIAGNOSIS — F411 Generalized anxiety disorder: Secondary | ICD-10-CM

## 2012-10-31 DIAGNOSIS — D631 Anemia in chronic kidney disease: Secondary | ICD-10-CM

## 2012-10-31 DIAGNOSIS — E7800 Pure hypercholesterolemia, unspecified: Secondary | ICD-10-CM

## 2012-10-31 DIAGNOSIS — K7460 Unspecified cirrhosis of liver: Secondary | ICD-10-CM

## 2012-10-31 DIAGNOSIS — I10 Essential (primary) hypertension: Secondary | ICD-10-CM

## 2012-10-31 MED ORDER — OXYCODONE HCL 5 MG PO TABS: ORAL_TABLET | ORAL | Status: DC

## 2012-10-31 NOTE — Patient Instructions (Addendum)
Today we updated your med list in our EPIC system...    Continue your current medications the same...  We refilled your Oxycodone per request...  Call for any questions...  Let's continue our 4 month follow up visits.Marland KitchenMarland Kitchen

## 2012-10-31 NOTE — Progress Notes (Signed)
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 IMMEDIATE RELEASE TABLET oxyCODONE (OXY IR/ROXICODONE) 5 MG immediate release tablet      Take one by mouth four times a day as needed for pain - Not to exceed 4 tablets a day    Take 1 tablet (5 mg total) by mouth every 6 (six) hours as needed for pain.  Discontinued Medications   CALCIUM ACETATE (PHOSLO) 667 MG CAPSULE    Take 2,001 mg by mouth 3 (three) times daily with meals.    HYDROXYZINE (VISTARIL) 100 MG CAPSULE    Take 1 capsule (100 mg total) by mouth 3 (three) times daily as needed for itching.   LORAZEPAM (ATIVAN) 1 MG TABLET    Take 1 tablet (1 mg total) by mouth 3 (three) times daily as needed for anxiety.   OXYCODONE (OXY IR/ROXICODONE) 5 MG IMMEDIATE RELEASE TABLET    Take 1-2 tab PO Q 6 hrs

## 2012-11-18 ENCOUNTER — Encounter (HOSPITAL_COMMUNITY): Payer: Self-pay

## 2012-11-18 ENCOUNTER — Emergency Department (HOSPITAL_COMMUNITY): Payer: Medicare Other

## 2012-11-18 ENCOUNTER — Emergency Department (HOSPITAL_COMMUNITY)
Admission: EM | Admit: 2012-11-18 | Discharge: 2012-11-18 | Disposition: A | Discharge status: Home / Self Care | Payer: Medicare Other | Attending: Emergency Medicine | Admitting: Emergency Medicine

## 2012-11-18 DIAGNOSIS — G40909 Epilepsy, unspecified, not intractable, without status epilepticus: Secondary | ICD-10-CM | POA: Insufficient documentation

## 2012-11-18 DIAGNOSIS — J45909 Unspecified asthma, uncomplicated: Secondary | ICD-10-CM | POA: Insufficient documentation

## 2012-11-18 DIAGNOSIS — Z8719 Personal history of other diseases of the digestive system: Secondary | ICD-10-CM | POA: Insufficient documentation

## 2012-11-18 DIAGNOSIS — J4489 Other specified chronic obstructive pulmonary disease: Secondary | ICD-10-CM | POA: Insufficient documentation

## 2012-11-18 DIAGNOSIS — Z79899 Other long term (current) drug therapy: Secondary | ICD-10-CM | POA: Insufficient documentation

## 2012-11-18 DIAGNOSIS — E7800 Pure hypercholesterolemia, unspecified: Secondary | ICD-10-CM | POA: Insufficient documentation

## 2012-11-18 DIAGNOSIS — M545 Low back pain, unspecified: Secondary | ICD-10-CM | POA: Insufficient documentation

## 2012-11-18 DIAGNOSIS — Z860100 Personal history of colon polyps, unspecified: Secondary | ICD-10-CM | POA: Insufficient documentation

## 2012-11-18 DIAGNOSIS — Z8679 Personal history of other diseases of the circulatory system: Secondary | ICD-10-CM | POA: Insufficient documentation

## 2012-11-18 DIAGNOSIS — F3289 Other specified depressive episodes: Secondary | ICD-10-CM | POA: Insufficient documentation

## 2012-11-18 DIAGNOSIS — R059 Cough, unspecified: Secondary | ICD-10-CM | POA: Insufficient documentation

## 2012-11-18 DIAGNOSIS — Z862 Personal history of diseases of the blood and blood-forming organs and certain disorders involving the immune mechanism: Secondary | ICD-10-CM | POA: Insufficient documentation

## 2012-11-18 DIAGNOSIS — R509 Fever, unspecified: Secondary | ICD-10-CM | POA: Insufficient documentation

## 2012-11-18 DIAGNOSIS — J449 Chronic obstructive pulmonary disease, unspecified: Secondary | ICD-10-CM | POA: Insufficient documentation

## 2012-11-18 DIAGNOSIS — F17200 Nicotine dependence, unspecified, uncomplicated: Secondary | ICD-10-CM | POA: Insufficient documentation

## 2012-11-18 DIAGNOSIS — R05 Cough: Secondary | ICD-10-CM | POA: Insufficient documentation

## 2012-11-18 DIAGNOSIS — I120 Hypertensive chronic kidney disease with stage 5 chronic kidney disease or end stage renal disease: Secondary | ICD-10-CM | POA: Insufficient documentation

## 2012-11-18 DIAGNOSIS — R011 Cardiac murmur, unspecified: Secondary | ICD-10-CM | POA: Insufficient documentation

## 2012-11-18 DIAGNOSIS — I509 Heart failure, unspecified: Secondary | ICD-10-CM | POA: Insufficient documentation

## 2012-11-18 DIAGNOSIS — IMO0001 Reserved for inherently not codable concepts without codable children: Secondary | ICD-10-CM | POA: Insufficient documentation

## 2012-11-18 DIAGNOSIS — J029 Acute pharyngitis, unspecified: Secondary | ICD-10-CM | POA: Insufficient documentation

## 2012-11-18 DIAGNOSIS — IMO0002 Reserved for concepts with insufficient information to code with codable children: Secondary | ICD-10-CM | POA: Insufficient documentation

## 2012-11-18 DIAGNOSIS — Z992 Dependence on renal dialysis: Secondary | ICD-10-CM | POA: Insufficient documentation

## 2012-11-18 DIAGNOSIS — N186 End stage renal disease: Secondary | ICD-10-CM | POA: Insufficient documentation

## 2012-11-18 DIAGNOSIS — Z8619 Personal history of other infectious and parasitic diseases: Secondary | ICD-10-CM | POA: Insufficient documentation

## 2012-11-18 DIAGNOSIS — Z8639 Personal history of other endocrine, nutritional and metabolic disease: Secondary | ICD-10-CM | POA: Insufficient documentation

## 2012-11-18 DIAGNOSIS — R110 Nausea: Secondary | ICD-10-CM | POA: Insufficient documentation

## 2012-11-18 DIAGNOSIS — F329 Major depressive disorder, single episode, unspecified: Secondary | ICD-10-CM | POA: Insufficient documentation

## 2012-11-18 DIAGNOSIS — J111 Influenza due to unidentified influenza virus with other respiratory manifestations: Secondary | ICD-10-CM

## 2012-11-18 DIAGNOSIS — R197 Diarrhea, unspecified: Secondary | ICD-10-CM | POA: Insufficient documentation

## 2012-11-18 DIAGNOSIS — F411 Generalized anxiety disorder: Secondary | ICD-10-CM | POA: Insufficient documentation

## 2012-11-18 LAB — POCT I-STAT, CHEM 8
Calcium, Ion: 0.99 mmol/L — ABNORMAL LOW (ref 1.12–1.23)
Chloride: 100 mEq/L (ref 96–112)
Glucose, Bld: 91 mg/dL (ref 70–99)
HCT: 31 % — ABNORMAL LOW (ref 39.0–52.0)
Hemoglobin: 10.5 g/dL — ABNORMAL LOW (ref 13.0–17.0)
TCO2: 26 mmol/L (ref 0–100)

## 2012-11-18 LAB — COMPREHENSIVE METABOLIC PANEL
ALT: 18 U/L (ref 0–53)
Alkaline Phosphatase: 206 U/L — ABNORMAL HIGH (ref 39–117)
CO2: 25 mEq/L (ref 19–32)
GFR calc Af Amer: 10 mL/min — ABNORMAL LOW (ref >90)
GFR calc non Af Amer: 9 mL/min — ABNORMAL LOW (ref >90)
Glucose, Bld: 92 mg/dL (ref 70–99)
Potassium: 4.8 mEq/L (ref 3.5–5.1)
Sodium: 138 mEq/L (ref 135–145)
Total Bilirubin: 0.3 mg/dL (ref 0.3–1.2)

## 2012-11-18 LAB — CBC WITH DIFFERENTIAL/PLATELET
Eosinophils Relative: 5 % (ref 0–5)
HCT: 28.8 % — ABNORMAL LOW (ref 39.0–52.0)
Lymphs Abs: 2.4 10*3/uL (ref 0.7–4.0)
MCH: 21 pg — ABNORMAL LOW (ref 26.0–34.0)
MCV: 71.1 fL — ABNORMAL LOW (ref 78.0–100.0)
Monocytes Absolute: 1.2 10*3/uL — ABNORMAL HIGH (ref 0.1–1.0)
Monocytes Relative: 19 % — ABNORMAL HIGH (ref 3–12)
Neutro Abs: 2.6 10*3/uL (ref 1.7–7.7)
RBC: 4.05 MIL/uL — ABNORMAL LOW (ref 4.22–5.81)
RDW: 19.8 % — ABNORMAL HIGH (ref 11.5–15.5)
WBC: 6.5 10*3/uL (ref 4.0–10.5)

## 2012-11-18 MED ORDER — SODIUM CHLORIDE 0.9 % IV BOLUS (SEPSIS)
500.0000 mL | Freq: Once | INTRAVENOUS | Status: AC
  Administered 2012-11-18: 500 mL via INTRAVENOUS

## 2012-11-18 MED ORDER — ONDANSETRON HCL 4 MG/2ML IJ SOLN
4.0000 mg | Freq: Once | INTRAMUSCULAR | Status: AC
  Administered 2012-11-18: 4 mg via INTRAVENOUS
  Filled 2012-11-18: qty 2

## 2012-11-18 MED ORDER — ONDANSETRON HCL 4 MG PO TABS: 4.0000 mg | ORAL_TABLET | Freq: Four times a day (QID) | ORAL | Status: AC

## 2012-11-18 MED ORDER — OXYCODONE HCL 10 MG PO TABS: 10.0000 mg | ORAL_TABLET | Freq: Three times a day (TID) | ORAL | Status: AC | PRN

## 2012-11-18 MED ORDER — OXYCODONE HCL 5 MG PO TABS
5.0000 mg | ORAL_TABLET | ORAL | Status: DC | PRN
  Administered 2012-11-18: 5 mg via ORAL
  Filled 2012-11-18: qty 1

## 2012-11-18 MED ORDER — OSELTAMIVIR PHOSPHATE 75 MG PO CAPS: 75.0000 mg | ORAL_CAPSULE | Freq: Two times a day (BID) | ORAL | Status: AC

## 2012-11-18 NOTE — ED Notes (Signed)
Patient transported to X-ray 

## 2012-11-18 NOTE — ED Notes (Signed)
Pt presents with nausea and diarrhea.  Pt reports bilateral flank pain, has not voided for over a week (normally voids 1/2 - 1 cup per day).  Pt reports he has not had HD since 4 treatments ago.  Pt reports difficulty completing treatment, can access AVF but it stops working.  Usually gets HD on Tuesday, Thursday and Saturday.  Pt reports pain to fistula in L upper arm.  Pt had liver biopsy x 1 month ago.
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
## 2012-11-18 NOTE — ED Notes (Addendum)
Pt has multiple complaints, N / D for 4 days, generalized weakness. C/o fistula pain. States he thinks  It is blocked, tender to touch. Flank pain for 4 days. Pt. Called Dialysis and was told to come here.

## 2012-11-18 NOTE — ED Provider Notes (Signed)
Medical screening examination/treatment/procedure(s) were conducted as a shared visit with non-physician practitioner(s) and myself.  I personally evaluated the patient during the encounter  The patient is well-appearing.  I suspect the majority of this is influenza-like illness.  He has a good palpable thrill of his graft.  Selective had some difficulties dialyzing through the graft.  This will need to be worked up further as an outpatient by his vascular surgeon and coordinated by his dialysis team.  There is nothing to do about his graft tonight.  If need be can have a temporary catheter placed by his dialysis team for dialysis.  No indication for acute dialysis today.  Patient has a lot of diarrhea.  Recommended that he orally hydrated himself to replace his fluid loss.  Home with Tamiflu.  Lyanne Co, MD 11/18/12 [phone]

## 2012-11-22 DIAGNOSIS — I059 Rheumatic mitral valve disease, unspecified: Secondary | ICD-10-CM

## 2012-11-24 ENCOUNTER — Other Ambulatory Visit: Payer: Self-pay | Admitting: *Deleted

## 2012-11-24 ENCOUNTER — Telehealth: Payer: Self-pay | Admitting: Oncology

## 2012-11-24 NOTE — Telephone Encounter (Signed)
Talked to patient and gave patient appt for 12/29/12

## 2012-12-09 ENCOUNTER — Telehealth: Payer: Self-pay | Admitting: Pulmonary Disease

## 2012-12-09 MED ORDER — PRAMIPEXOLE DIHYDROCHLORIDE 0.25 MG PO TABS: 0.2500 mg | ORAL_TABLET | Freq: Two times a day (BID) | ORAL | Status: AC

## 2012-12-09 NOTE — Telephone Encounter (Signed)
Called and spoke with pt and he is aware that per SN --ok to send in mirapex 0.25 mg  #60  1 po bid for RLS symptoms.  This rx has been sent in to [facility] on [address] per pts request.  Nothing further is needed.

## 2012-12-09 NOTE — Telephone Encounter (Signed)
Called and spoke with pt and he stated that he has been up for 2 days due to his legs.  He stated that his legs will not sit still.  He is having a hard time at dialysis due to his legs--he stated that he cannot sit too long while on the dialysis.  Pt is requesting medication for restless leg.  He stated that he has spoke with his other doctors and they keep telling him to call SN since he is primary care.  Please advise. Thanks  Allergies  Allergen Reactions  . Chantix (Varenicline) Other (See Comments)    Hallucination  . Dilaudid (Hydromorphone Hcl)     Causes itching  . Iron Other (See Comments)    Causes pt to bruise easily  . Morphine And Related Nausea And Vomiting

## 2012-12-29 ENCOUNTER — Ambulatory Visit: Payer: Medicare Other | Admitting: Oncology

## 2012-12-30 ENCOUNTER — Ambulatory Visit: Payer: Medicare Other | Admitting: Oncology

## 2012-12-31 DEATH — deceased

## 2013-01-30 ENCOUNTER — Encounter: Payer: Self-pay | Admitting: Gastroenterology

## 2013-03-01 ENCOUNTER — Ambulatory Visit: Payer: Medicare Other | Admitting: Pulmonary Disease

## 2013-04-26 IMAGING — CR DG CHEST 1V PORT
1 series · 1 of 1 positions shown · non-contrast
Comparison: 07/09/2012

CLINICAL DATA: Pulmonary edema

PORTABLE CHEST - 1 VIEW

[AP]
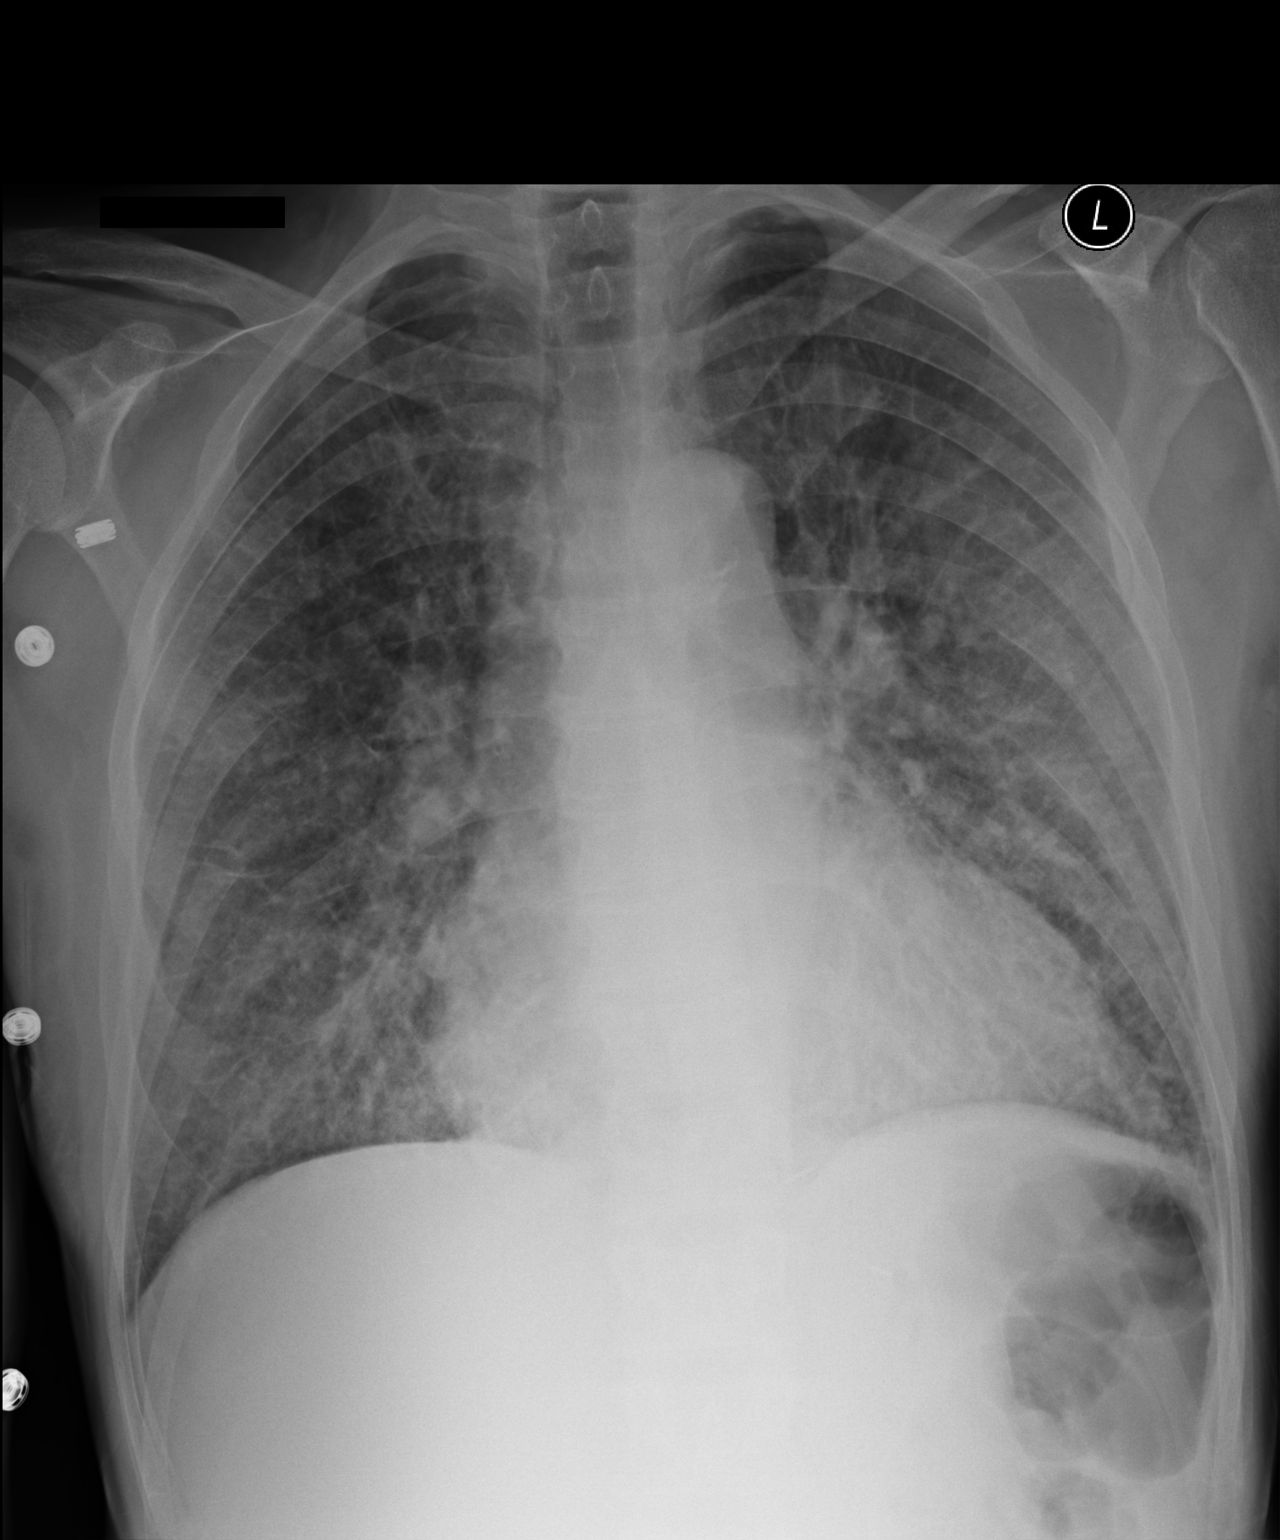

[1 of 1 positions shown; findings below may reference images not displayed]

FINDINGS: Cardiomegaly again noted.  Persistent residual mild
interstitial edema with slight improvement in aeration.  No
segmental infiltrate.
IMPRESSION: No segmental infiltrate.  Persistent residual mild interstitial
edema with slight improvement in aeration.  Cardiomegaly again
noted.

## 2013-05-05 IMAGING — US US BIOPSY
1 series · 12 of 12 positions shown · non-contrast
Comparison: CT 06/25/2012

CLINICAL DATA: Cirrhosis, liver lesions

ULTRASOUND-GUIDED LIVER LESION CORE BIOPSY

[Series 1: us biopsy · 0.30mm/px · 12 of 12 slices shown]
[im 1/12]
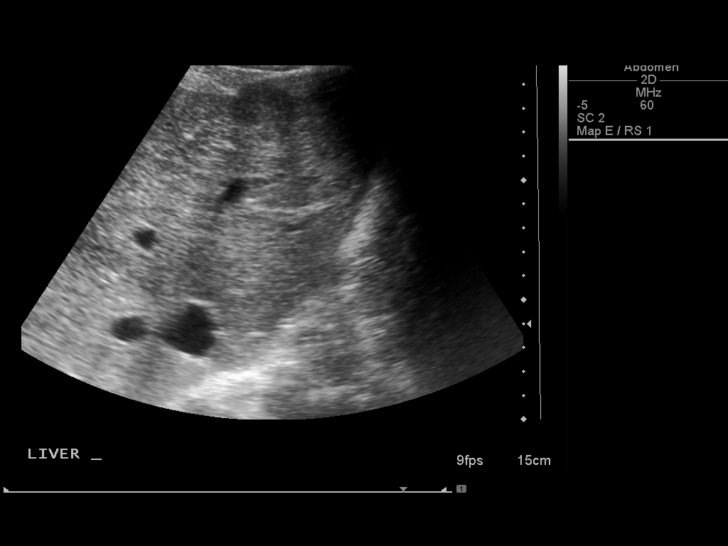
[im 2/12]
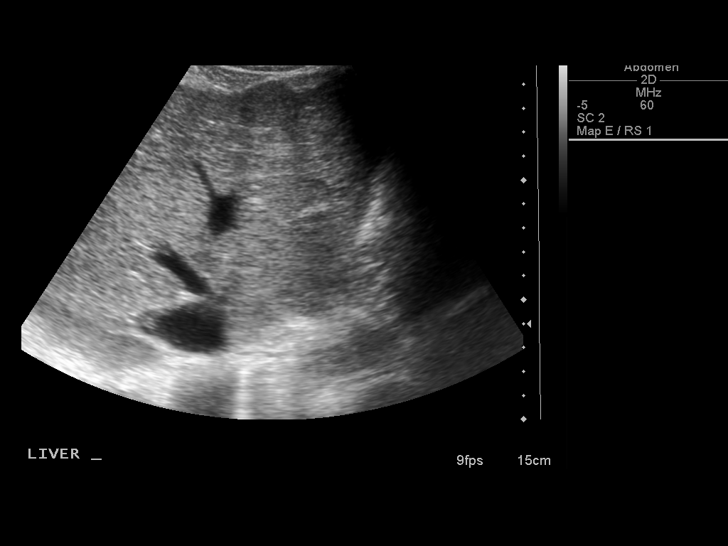
[im 3/12]
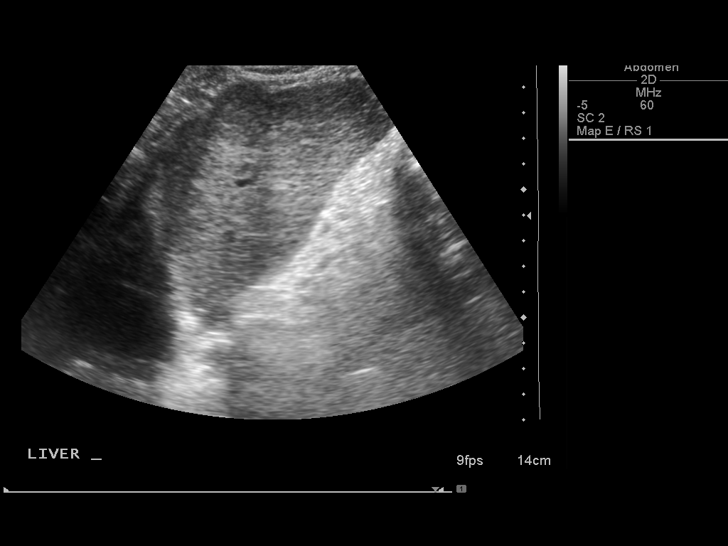
[im 4/12]
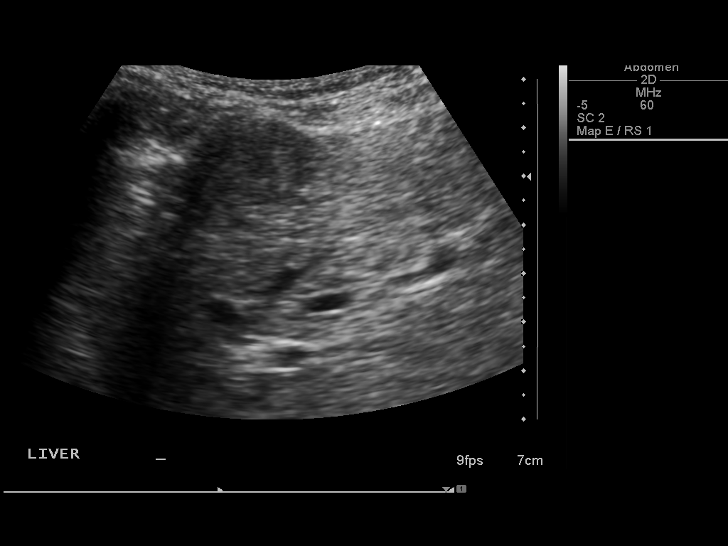
[im 5/12]
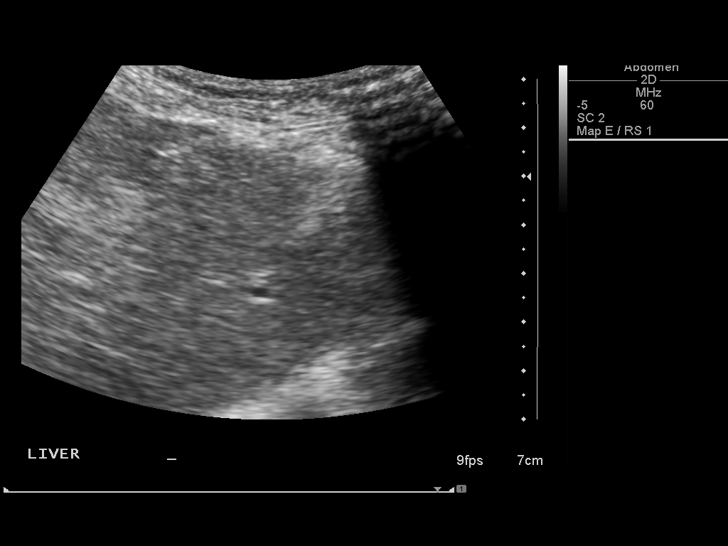
[im 6/12]
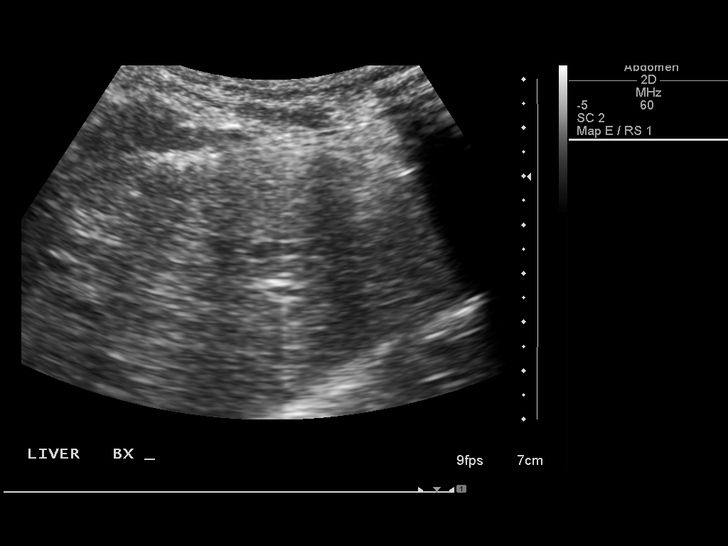
[im 7/12]
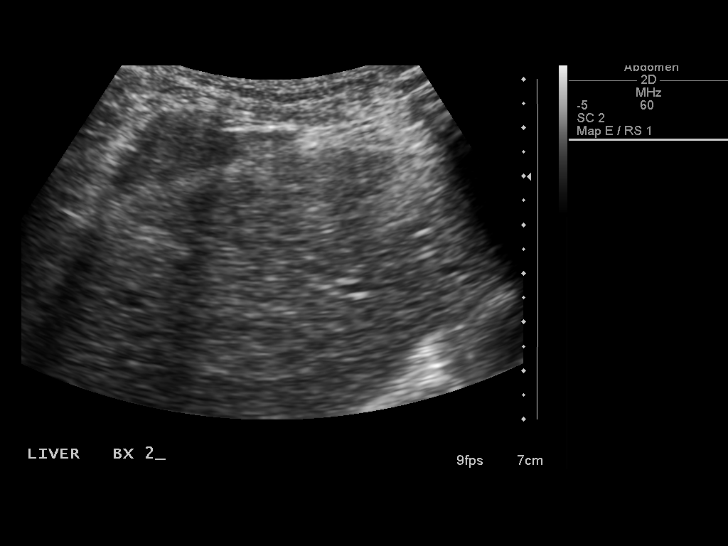
[im 8/12]
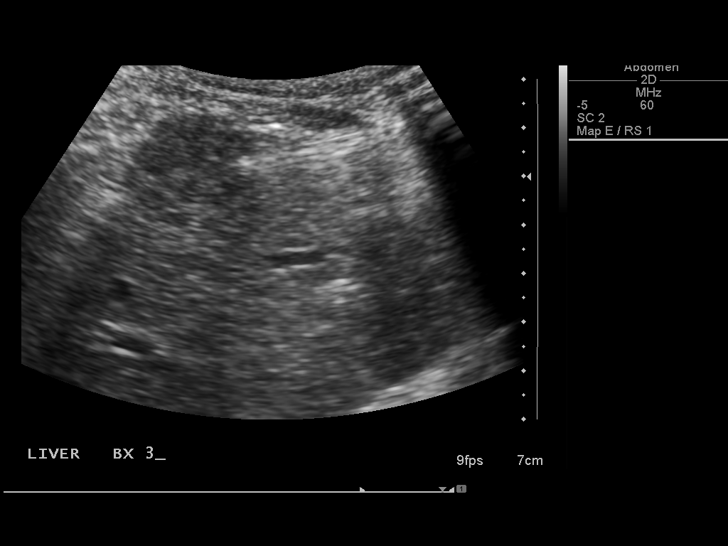
[im 9/12]
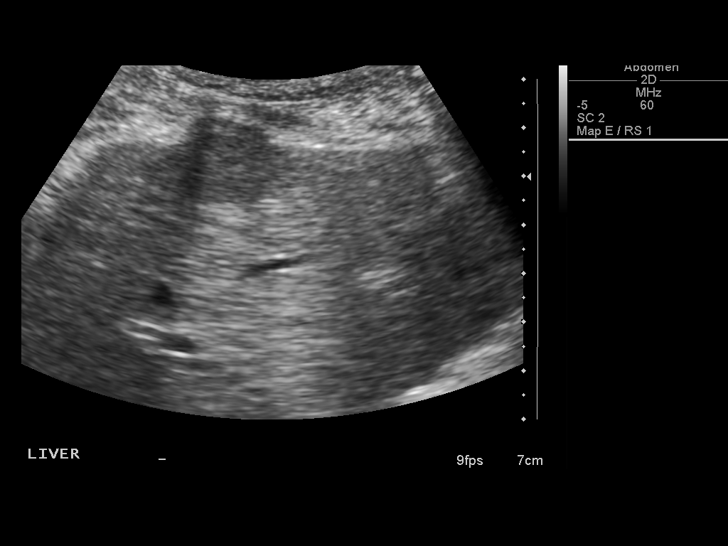
[im 10/12]
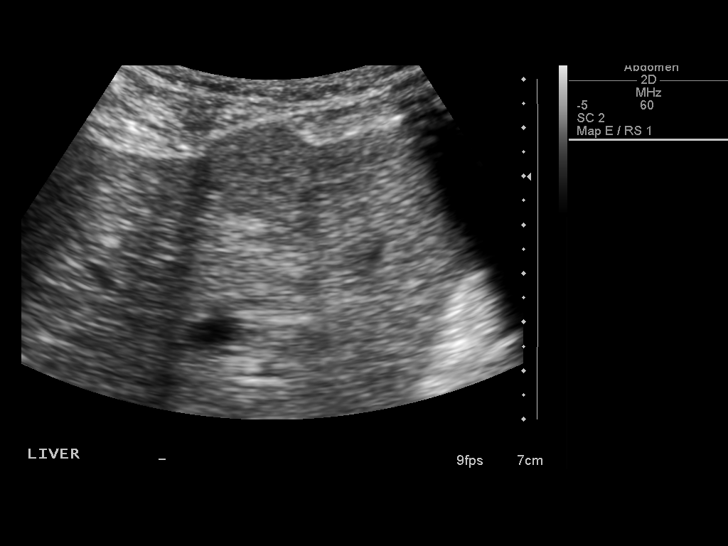
[im 11/12]
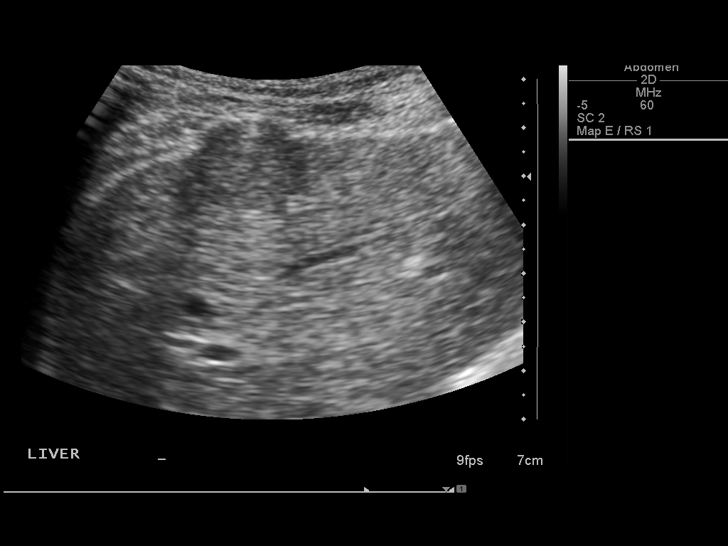
[im 12/12]
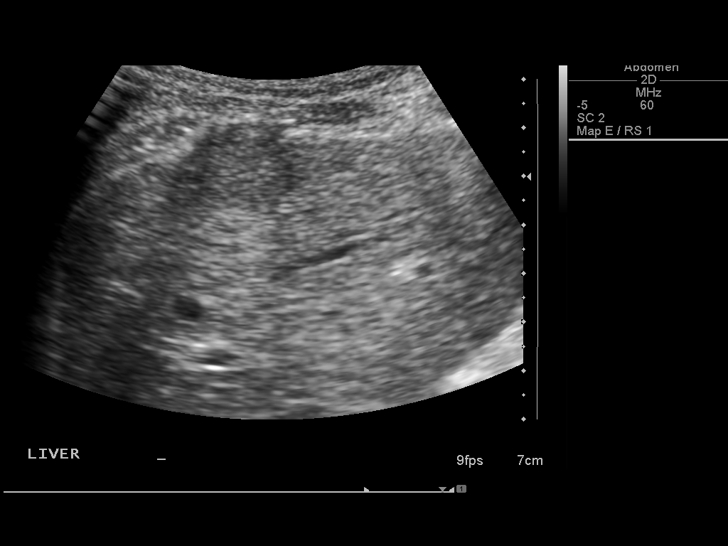

[12 of 12 positions shown; findings below may reference images not displayed]

Technique and findings: The procedure, risks (including but not
limited to bleeding, infection, organ damage), benefits, and
alternatives were explained to the patient.  Questions regarding
the procedure were encouraged and answered.  The patient
understands and consents to the procedure.Survey ultrasound of the
liver was performed the lesion was identified and an appropriate
skin entry site was determined. Operator donned sterile gloves and
mask.   Site was marked, prepped with Betadine, draped in usual
sterile fashion, infiltrated locally with 1% lidocaine.

Intravenous Fentanyl and Versed were administered as conscious
sedation during continuous cardiorespiratory monitoring by the
radiology RN, with a total moderate sedation time of eight minutes.

Under real time ultrasound guidance, a 17 gauge trocar needle was
advanced to the margin of the lesion.  Once needle tip position was
confirmed, coaxial 18-gauge core biopsy samples were obtained,
submitted in formalin to  surgical pathology.  The guide needle was
removed.  Followup scans show no hematoma or other apparent
complication. The patient tolerated the procedure well.
IMPRESSION: Technically successful ultrasound-guided liver lesion core biopsy.

## 2018-07-20 NOTE — Telephone Encounter (Signed)
This encounter was created in error - please disregard.
